# Patient Record
Sex: Female | Born: 1950 | Race: Black or African American | Hispanic: No | Smoking: Former smoker
Health system: Southern US, Community
[De-identification: ages and names within clinical notes are randomized; demographics above are authoritative.]

## PROBLEM LIST (undated history)

## (undated) DIAGNOSIS — G43909 Migraine, unspecified, not intractable, without status migrainosus: Secondary | ICD-10-CM

## (undated) DIAGNOSIS — M1990 Unspecified osteoarthritis, unspecified site: Secondary | ICD-10-CM

## (undated) DIAGNOSIS — T782XXA Anaphylactic shock, unspecified, initial encounter: Secondary | ICD-10-CM

## (undated) DIAGNOSIS — I499 Cardiac arrhythmia, unspecified: Secondary | ICD-10-CM

## (undated) DIAGNOSIS — K2950 Unspecified chronic gastritis without bleeding: Secondary | ICD-10-CM

## (undated) DIAGNOSIS — T8859XA Other complications of anesthesia, initial encounter: Secondary | ICD-10-CM

## (undated) DIAGNOSIS — Z8619 Personal history of other infectious and parasitic diseases: Secondary | ICD-10-CM

## (undated) DIAGNOSIS — R7689 Other specified abnormal immunological findings in serum: Secondary | ICD-10-CM

## (undated) DIAGNOSIS — I729 Aneurysm of unspecified site: Secondary | ICD-10-CM

## (undated) DIAGNOSIS — E119 Type 2 diabetes mellitus without complications: Secondary | ICD-10-CM

## (undated) DIAGNOSIS — B029 Zoster without complications: Secondary | ICD-10-CM

## (undated) DIAGNOSIS — B9681 Helicobacter pylori [H. pylori] as the cause of diseases classified elsewhere: Secondary | ICD-10-CM

## (undated) DIAGNOSIS — J45909 Unspecified asthma, uncomplicated: Secondary | ICD-10-CM

## (undated) DIAGNOSIS — I671 Cerebral aneurysm, nonruptured: Secondary | ICD-10-CM

## (undated) DIAGNOSIS — I10 Essential (primary) hypertension: Secondary | ICD-10-CM

## (undated) DIAGNOSIS — G709 Myoneural disorder, unspecified: Secondary | ICD-10-CM

## (undated) DIAGNOSIS — R768 Other specified abnormal immunological findings in serum: Secondary | ICD-10-CM

## (undated) DIAGNOSIS — K635 Polyp of colon: Secondary | ICD-10-CM

## (undated) DIAGNOSIS — T4145XA Adverse effect of unspecified anesthetic, initial encounter: Secondary | ICD-10-CM

## (undated) HISTORY — DX: Unspecified chronic gastritis without bleeding: K29.50

## (undated) HISTORY — PX: BREAST CYST ASPIRATION: SHX578

## (undated) HISTORY — DX: Personal history of other infectious and parasitic diseases: Z86.19

## (undated) HISTORY — DX: Unspecified asthma, uncomplicated: J45.909

## (undated) HISTORY — DX: Zoster without complications: B02.9

## (undated) HISTORY — DX: Essential (primary) hypertension: I10

## (undated) HISTORY — DX: Helicobacter pylori (H. pylori) as the cause of diseases classified elsewhere: B96.81

## (undated) HISTORY — PX: OOPHORECTOMY: SHX86

## (undated) HISTORY — PX: EYE SURGERY: SHX253

## (undated) HISTORY — DX: Anaphylactic shock, unspecified, initial encounter: T78.2XXA

## (undated) HISTORY — DX: Polyp of colon: K63.5

## (undated) HISTORY — DX: Type 2 diabetes mellitus without complications: E11.9

## (undated) HISTORY — DX: Other specified abnormal immunological findings in serum: R76.8

## (undated) HISTORY — PX: FRACTURE SURGERY: SHX138

## (undated) HISTORY — PX: CHOLECYSTECTOMY: SHX55

## (undated) HISTORY — PX: TONSILLECTOMY: SUR1361

## (undated) HISTORY — DX: Other specified abnormal immunological findings in serum: R76.89

## (undated) HISTORY — PX: REPLACEMENT TOTAL KNEE: SUR1224

## (undated) HISTORY — PX: ABDOMINAL HYSTERECTOMY: SHX81

---

## 1973-07-27 HISTORY — PX: APPENDECTOMY: SHX54

## 1978-07-27 DIAGNOSIS — J45909 Unspecified asthma, uncomplicated: Secondary | ICD-10-CM | POA: Insufficient documentation

## 1987-07-28 DIAGNOSIS — Z90710 Acquired absence of both cervix and uterus: Secondary | ICD-10-CM | POA: Insufficient documentation

## 1998-07-27 DIAGNOSIS — E7800 Pure hypercholesterolemia, unspecified: Secondary | ICD-10-CM | POA: Insufficient documentation

## 1999-07-28 HISTORY — PX: OTHER SURGICAL HISTORY: SHX169

## 2003-07-28 DIAGNOSIS — I671 Cerebral aneurysm, nonruptured: Secondary | ICD-10-CM

## 2003-07-28 HISTORY — DX: Cerebral aneurysm, nonruptured: I67.1

## 2003-11-05 DIAGNOSIS — I776 Arteritis, unspecified: Secondary | ICD-10-CM | POA: Insufficient documentation

## 2004-07-27 DIAGNOSIS — Z8639 Personal history of other endocrine, nutritional and metabolic disease: Secondary | ICD-10-CM | POA: Insufficient documentation

## 2004-09-23 ENCOUNTER — Ambulatory Visit: Payer: Self-pay | Admitting: Unknown Physician Specialty

## 2005-07-27 DIAGNOSIS — K635 Polyp of colon: Secondary | ICD-10-CM

## 2005-07-27 HISTORY — DX: Polyp of colon: K63.5

## 2006-05-27 ENCOUNTER — Ambulatory Visit: Payer: Self-pay | Admitting: Family Medicine

## 2006-07-01 ENCOUNTER — Ambulatory Visit: Payer: Self-pay | Admitting: Gastroenterology

## 2007-04-18 ENCOUNTER — Ambulatory Visit: Payer: Self-pay | Admitting: Family Medicine

## 2007-09-29 ENCOUNTER — Ambulatory Visit: Payer: Self-pay | Admitting: Family Medicine

## 2008-11-08 DIAGNOSIS — Z8249 Family history of ischemic heart disease and other diseases of the circulatory system: Secondary | ICD-10-CM | POA: Insufficient documentation

## 2008-11-20 ENCOUNTER — Ambulatory Visit: Payer: Self-pay | Admitting: Family Medicine

## 2009-02-17 DIAGNOSIS — S39012A Strain of muscle, fascia and tendon of lower back, initial encounter: Secondary | ICD-10-CM | POA: Insufficient documentation

## 2009-03-19 DIAGNOSIS — I10 Essential (primary) hypertension: Secondary | ICD-10-CM | POA: Insufficient documentation

## 2009-04-25 ENCOUNTER — Ambulatory Visit: Payer: Self-pay | Admitting: Family Medicine

## 2009-07-27 HISTORY — PX: OTHER SURGICAL HISTORY: SHX169

## 2009-11-06 ENCOUNTER — Ambulatory Visit: Payer: Self-pay | Admitting: Family Medicine

## 2009-12-09 DIAGNOSIS — K449 Diaphragmatic hernia without obstruction or gangrene: Secondary | ICD-10-CM | POA: Insufficient documentation

## 2010-07-17 ENCOUNTER — Ambulatory Visit: Payer: Self-pay | Admitting: Family Medicine

## 2010-07-17 LAB — HM MAMMOGRAPHY: HM Mammogram: NORMAL

## 2010-08-08 ENCOUNTER — Encounter: Payer: Self-pay | Admitting: Internal Medicine

## 2010-08-27 ENCOUNTER — Encounter: Payer: Self-pay | Admitting: Internal Medicine

## 2010-11-29 ENCOUNTER — Emergency Department: Payer: Self-pay | Admitting: Unknown Physician Specialty

## 2011-06-02 ENCOUNTER — Ambulatory Visit: Payer: Self-pay | Admitting: Family Medicine

## 2011-08-04 ENCOUNTER — Ambulatory Visit: Payer: Self-pay | Admitting: Unknown Physician Specialty

## 2011-10-09 DIAGNOSIS — E559 Vitamin D deficiency, unspecified: Secondary | ICD-10-CM | POA: Insufficient documentation

## 2012-01-06 LAB — HM PAP SMEAR: HM Pap smear: NEGATIVE

## 2012-10-27 ENCOUNTER — Ambulatory Visit: Payer: Self-pay | Admitting: Family Medicine

## 2013-12-28 DIAGNOSIS — J309 Allergic rhinitis, unspecified: Secondary | ICD-10-CM | POA: Diagnosis not present

## 2014-01-05 DIAGNOSIS — J309 Allergic rhinitis, unspecified: Secondary | ICD-10-CM | POA: Diagnosis not present

## 2014-01-09 DIAGNOSIS — I10 Essential (primary) hypertension: Secondary | ICD-10-CM | POA: Diagnosis not present

## 2014-01-09 DIAGNOSIS — R112 Nausea with vomiting, unspecified: Secondary | ICD-10-CM | POA: Diagnosis not present

## 2014-01-09 DIAGNOSIS — E559 Vitamin D deficiency, unspecified: Secondary | ICD-10-CM | POA: Diagnosis not present

## 2014-01-09 DIAGNOSIS — R1013 Epigastric pain: Secondary | ICD-10-CM | POA: Diagnosis not present

## 2014-01-10 DIAGNOSIS — R1013 Epigastric pain: Secondary | ICD-10-CM | POA: Diagnosis not present

## 2014-01-16 DIAGNOSIS — J309 Allergic rhinitis, unspecified: Secondary | ICD-10-CM | POA: Diagnosis not present

## 2014-01-17 DIAGNOSIS — H2510 Age-related nuclear cataract, unspecified eye: Secondary | ICD-10-CM | POA: Diagnosis not present

## 2014-02-01 DIAGNOSIS — J309 Allergic rhinitis, unspecified: Secondary | ICD-10-CM | POA: Diagnosis not present

## 2014-02-10 ENCOUNTER — Inpatient Hospital Stay: Payer: Self-pay | Admitting: Internal Medicine

## 2014-02-10 DIAGNOSIS — I10 Essential (primary) hypertension: Secondary | ICD-10-CM | POA: Diagnosis not present

## 2014-02-10 DIAGNOSIS — Z880 Allergy status to penicillin: Secondary | ICD-10-CM | POA: Diagnosis not present

## 2014-02-10 DIAGNOSIS — E119 Type 2 diabetes mellitus without complications: Secondary | ICD-10-CM | POA: Diagnosis not present

## 2014-02-10 DIAGNOSIS — E876 Hypokalemia: Secondary | ICD-10-CM | POA: Diagnosis not present

## 2014-02-10 DIAGNOSIS — J9600 Acute respiratory failure, unspecified whether with hypoxia or hypercapnia: Secondary | ICD-10-CM | POA: Diagnosis not present

## 2014-02-10 DIAGNOSIS — Z5189 Encounter for other specified aftercare: Secondary | ICD-10-CM | POA: Diagnosis not present

## 2014-02-10 DIAGNOSIS — T782XXA Anaphylactic shock, unspecified, initial encounter: Secondary | ICD-10-CM | POA: Diagnosis not present

## 2014-02-10 DIAGNOSIS — T783XXA Angioneurotic edema, initial encounter: Secondary | ICD-10-CM | POA: Diagnosis not present

## 2014-02-10 DIAGNOSIS — R5381 Other malaise: Secondary | ICD-10-CM | POA: Diagnosis not present

## 2014-02-10 DIAGNOSIS — Z882 Allergy status to sulfonamides status: Secondary | ICD-10-CM | POA: Diagnosis not present

## 2014-02-10 DIAGNOSIS — Z7982 Long term (current) use of aspirin: Secondary | ICD-10-CM | POA: Diagnosis not present

## 2014-02-10 DIAGNOSIS — E785 Hyperlipidemia, unspecified: Secondary | ICD-10-CM | POA: Diagnosis present

## 2014-02-10 DIAGNOSIS — Z9911 Dependence on respirator [ventilator] status: Secondary | ICD-10-CM | POA: Diagnosis not present

## 2014-02-10 DIAGNOSIS — M6281 Muscle weakness (generalized): Secondary | ICD-10-CM | POA: Diagnosis not present

## 2014-02-10 DIAGNOSIS — I499 Cardiac arrhythmia, unspecified: Secondary | ICD-10-CM | POA: Diagnosis not present

## 2014-02-10 DIAGNOSIS — J45909 Unspecified asthma, uncomplicated: Secondary | ICD-10-CM | POA: Diagnosis not present

## 2014-02-10 DIAGNOSIS — R0989 Other specified symptoms and signs involving the circulatory and respiratory systems: Secondary | ICD-10-CM | POA: Diagnosis not present

## 2014-02-10 DIAGNOSIS — Z4682 Encounter for fitting and adjustment of non-vascular catheter: Secondary | ICD-10-CM | POA: Diagnosis not present

## 2014-02-10 DIAGNOSIS — J988 Other specified respiratory disorders: Secondary | ICD-10-CM | POA: Diagnosis not present

## 2014-02-10 LAB — BASIC METABOLIC PANEL
Anion Gap: 13 (ref 7–16)
BUN: 21 mg/dL — ABNORMAL HIGH (ref 7–18)
Calcium, Total: 8.8 mg/dL (ref 8.5–10.1)
Chloride: 101 mmol/L (ref 98–107)
Co2: 25 mmol/L (ref 21–32)
Creatinine: 1.26 mg/dL (ref 0.60–1.30)
EGFR (African American): 52 — ABNORMAL LOW
EGFR (Non-African Amer.): 45 — ABNORMAL LOW
Glucose: 233 mg/dL — ABNORMAL HIGH (ref 65–99)
Osmolality: 288 (ref 275–301)
Potassium: 2.6 mmol/L — ABNORMAL LOW (ref 3.5–5.1)
Sodium: 139 mmol/L (ref 136–145)

## 2014-02-10 LAB — CBC
HCT: 35.9 % (ref 35.0–47.0)
HGB: 11 g/dL — ABNORMAL LOW (ref 12.0–16.0)
MCH: 25.8 pg — ABNORMAL LOW (ref 26.0–34.0)
MCHC: 30.7 g/dL — ABNORMAL LOW (ref 32.0–36.0)
MCV: 84 fL (ref 80–100)
Platelet: 341 10*3/uL (ref 150–440)
RBC: 4.27 10*6/uL (ref 3.80–5.20)
RDW: 15.3 % — ABNORMAL HIGH (ref 11.5–14.5)
WBC: 22.1 10*3/uL — ABNORMAL HIGH (ref 3.6–11.0)

## 2014-02-10 LAB — MAGNESIUM: Magnesium: 1.4 mg/dL — ABNORMAL LOW

## 2014-02-10 LAB — HEMOGLOBIN A1C: Hemoglobin A1C: 7.8 % — ABNORMAL HIGH (ref 4.2–6.3)

## 2014-02-11 LAB — CBC WITH DIFFERENTIAL/PLATELET
Basophil #: 0 10*3/uL (ref 0.0–0.1)
Basophil %: 0.3 %
Eosinophil #: 0 10*3/uL (ref 0.0–0.7)
Eosinophil %: 0.1 %
HCT: 33.6 % — ABNORMAL LOW (ref 35.0–47.0)
HGB: 10.5 g/dL — ABNORMAL LOW (ref 12.0–16.0)
Lymphocyte #: 1.2 10*3/uL (ref 1.0–3.6)
Lymphocyte %: 9.9 %
MCH: 25.7 pg — ABNORMAL LOW (ref 26.0–34.0)
MCHC: 31.2 g/dL — ABNORMAL LOW (ref 32.0–36.0)
MCV: 82 fL (ref 80–100)
Monocyte #: 0.3 x10 3/mm (ref 0.2–0.9)
Monocyte %: 2.8 %
Neutrophil #: 10.5 10*3/uL — ABNORMAL HIGH (ref 1.4–6.5)
Neutrophil %: 86.9 %
Platelet: 260 10*3/uL (ref 150–440)
RBC: 4.07 10*6/uL (ref 3.80–5.20)
RDW: 15.3 % — ABNORMAL HIGH (ref 11.5–14.5)
WBC: 12 10*3/uL — ABNORMAL HIGH (ref 3.6–11.0)

## 2014-02-11 LAB — BASIC METABOLIC PANEL
Anion Gap: 9 (ref 7–16)
BUN: 13 mg/dL (ref 7–18)
Calcium, Total: 8.4 mg/dL — ABNORMAL LOW (ref 8.5–10.1)
Chloride: 109 mmol/L — ABNORMAL HIGH (ref 98–107)
Co2: 23 mmol/L (ref 21–32)
Creatinine: 0.98 mg/dL (ref 0.60–1.30)
EGFR (African American): >60
EGFR (Non-African Amer.): >60
Glucose: 168 mg/dL — ABNORMAL HIGH (ref 65–99)
Osmolality: 285 (ref 275–301)
Potassium: 3.9 mmol/L (ref 3.5–5.1)
Sodium: 141 mmol/L (ref 136–145)

## 2014-02-11 LAB — MAGNESIUM: Magnesium: 2.2 mg/dL

## 2014-02-12 LAB — BASIC METABOLIC PANEL
Anion Gap: 7 (ref 7–16)
BUN: 12 mg/dL (ref 7–18)
Calcium, Total: 8 mg/dL — ABNORMAL LOW (ref 8.5–10.1)
Chloride: 112 mmol/L — ABNORMAL HIGH (ref 98–107)
Co2: 23 mmol/L (ref 21–32)
Creatinine: 1.04 mg/dL (ref 0.60–1.30)
EGFR (African American): >60
EGFR (Non-African Amer.): 56 — ABNORMAL LOW
Glucose: 190 mg/dL — ABNORMAL HIGH (ref 65–99)
Osmolality: 288 (ref 275–301)
Potassium: 4.4 mmol/L (ref 3.5–5.1)
Sodium: 142 mmol/L (ref 136–145)

## 2014-02-14 DIAGNOSIS — M6281 Muscle weakness (generalized): Secondary | ICD-10-CM | POA: Diagnosis not present

## 2014-02-14 DIAGNOSIS — Z5189 Encounter for other specified aftercare: Secondary | ICD-10-CM | POA: Diagnosis not present

## 2014-02-14 DIAGNOSIS — T782XXA Anaphylactic shock, unspecified, initial encounter: Secondary | ICD-10-CM | POA: Diagnosis not present

## 2014-02-14 DIAGNOSIS — I10 Essential (primary) hypertension: Secondary | ICD-10-CM | POA: Diagnosis not present

## 2014-02-14 DIAGNOSIS — R5381 Other malaise: Secondary | ICD-10-CM | POA: Diagnosis not present

## 2014-02-14 DIAGNOSIS — J45909 Unspecified asthma, uncomplicated: Secondary | ICD-10-CM | POA: Diagnosis not present

## 2014-02-14 DIAGNOSIS — E785 Hyperlipidemia, unspecified: Secondary | ICD-10-CM | POA: Diagnosis not present

## 2014-02-14 DIAGNOSIS — I499 Cardiac arrhythmia, unspecified: Secondary | ICD-10-CM | POA: Diagnosis not present

## 2014-02-14 DIAGNOSIS — J45901 Unspecified asthma with (acute) exacerbation: Secondary | ICD-10-CM | POA: Diagnosis not present

## 2014-02-14 DIAGNOSIS — J9600 Acute respiratory failure, unspecified whether with hypoxia or hypercapnia: Secondary | ICD-10-CM | POA: Diagnosis not present

## 2014-02-14 DIAGNOSIS — T783XXA Angioneurotic edema, initial encounter: Secondary | ICD-10-CM | POA: Diagnosis not present

## 2014-02-14 DIAGNOSIS — E119 Type 2 diabetes mellitus without complications: Secondary | ICD-10-CM | POA: Diagnosis not present

## 2014-02-15 ENCOUNTER — Encounter: Payer: Self-pay | Admitting: Internal Medicine

## 2014-02-15 DIAGNOSIS — J45901 Unspecified asthma with (acute) exacerbation: Secondary | ICD-10-CM | POA: Diagnosis not present

## 2014-02-15 DIAGNOSIS — E119 Type 2 diabetes mellitus without complications: Secondary | ICD-10-CM | POA: Diagnosis not present

## 2014-02-22 DIAGNOSIS — E785 Hyperlipidemia, unspecified: Secondary | ICD-10-CM | POA: Diagnosis not present

## 2014-02-22 DIAGNOSIS — IMO0002 Reserved for concepts with insufficient information to code with codable children: Secondary | ICD-10-CM | POA: Diagnosis not present

## 2014-02-22 DIAGNOSIS — J3089 Other allergic rhinitis: Secondary | ICD-10-CM | POA: Diagnosis not present

## 2014-02-22 DIAGNOSIS — I10 Essential (primary) hypertension: Secondary | ICD-10-CM | POA: Diagnosis not present

## 2014-02-22 DIAGNOSIS — J301 Allergic rhinitis due to pollen: Secondary | ICD-10-CM | POA: Diagnosis not present

## 2014-02-22 DIAGNOSIS — M6281 Muscle weakness (generalized): Secondary | ICD-10-CM | POA: Diagnosis not present

## 2014-02-22 DIAGNOSIS — J45909 Unspecified asthma, uncomplicated: Secondary | ICD-10-CM | POA: Diagnosis not present

## 2014-02-22 DIAGNOSIS — L2089 Other atopic dermatitis: Secondary | ICD-10-CM | POA: Diagnosis not present

## 2014-02-22 DIAGNOSIS — Z9181 History of falling: Secondary | ICD-10-CM | POA: Diagnosis not present

## 2014-02-22 DIAGNOSIS — E119 Type 2 diabetes mellitus without complications: Secondary | ICD-10-CM | POA: Diagnosis not present

## 2014-02-24 ENCOUNTER — Encounter: Payer: Self-pay | Admitting: Internal Medicine

## 2014-02-25 DIAGNOSIS — E119 Type 2 diabetes mellitus without complications: Secondary | ICD-10-CM | POA: Diagnosis not present

## 2014-02-25 DIAGNOSIS — E785 Hyperlipidemia, unspecified: Secondary | ICD-10-CM | POA: Diagnosis not present

## 2014-02-25 DIAGNOSIS — Z9181 History of falling: Secondary | ICD-10-CM | POA: Diagnosis not present

## 2014-02-25 DIAGNOSIS — IMO0002 Reserved for concepts with insufficient information to code with codable children: Secondary | ICD-10-CM | POA: Diagnosis not present

## 2014-02-25 DIAGNOSIS — I10 Essential (primary) hypertension: Secondary | ICD-10-CM | POA: Diagnosis not present

## 2014-02-25 DIAGNOSIS — M6281 Muscle weakness (generalized): Secondary | ICD-10-CM | POA: Diagnosis not present

## 2014-02-26 DIAGNOSIS — I10 Essential (primary) hypertension: Secondary | ICD-10-CM | POA: Diagnosis not present

## 2014-02-26 DIAGNOSIS — E559 Vitamin D deficiency, unspecified: Secondary | ICD-10-CM | POA: Diagnosis not present

## 2014-02-26 DIAGNOSIS — R112 Nausea with vomiting, unspecified: Secondary | ICD-10-CM | POA: Diagnosis not present

## 2014-02-26 DIAGNOSIS — T782XXA Anaphylactic shock, unspecified, initial encounter: Secondary | ICD-10-CM | POA: Diagnosis not present

## 2014-03-01 DIAGNOSIS — E785 Hyperlipidemia, unspecified: Secondary | ICD-10-CM | POA: Diagnosis not present

## 2014-03-01 DIAGNOSIS — IMO0002 Reserved for concepts with insufficient information to code with codable children: Secondary | ICD-10-CM | POA: Diagnosis not present

## 2014-03-01 DIAGNOSIS — I10 Essential (primary) hypertension: Secondary | ICD-10-CM | POA: Diagnosis not present

## 2014-03-01 DIAGNOSIS — E119 Type 2 diabetes mellitus without complications: Secondary | ICD-10-CM | POA: Diagnosis not present

## 2014-03-01 DIAGNOSIS — Z9181 History of falling: Secondary | ICD-10-CM | POA: Diagnosis not present

## 2014-03-01 DIAGNOSIS — M6281 Muscle weakness (generalized): Secondary | ICD-10-CM | POA: Diagnosis not present

## 2014-03-02 DIAGNOSIS — E119 Type 2 diabetes mellitus without complications: Secondary | ICD-10-CM | POA: Diagnosis not present

## 2014-03-02 DIAGNOSIS — M6281 Muscle weakness (generalized): Secondary | ICD-10-CM | POA: Diagnosis not present

## 2014-03-02 DIAGNOSIS — I10 Essential (primary) hypertension: Secondary | ICD-10-CM | POA: Diagnosis not present

## 2014-03-02 DIAGNOSIS — Z9181 History of falling: Secondary | ICD-10-CM | POA: Diagnosis not present

## 2014-03-02 DIAGNOSIS — IMO0002 Reserved for concepts with insufficient information to code with codable children: Secondary | ICD-10-CM | POA: Diagnosis not present

## 2014-03-02 DIAGNOSIS — E785 Hyperlipidemia, unspecified: Secondary | ICD-10-CM | POA: Diagnosis not present

## 2014-03-03 DIAGNOSIS — M6281 Muscle weakness (generalized): Secondary | ICD-10-CM | POA: Diagnosis not present

## 2014-03-03 DIAGNOSIS — E785 Hyperlipidemia, unspecified: Secondary | ICD-10-CM | POA: Diagnosis not present

## 2014-03-03 DIAGNOSIS — Z9181 History of falling: Secondary | ICD-10-CM | POA: Diagnosis not present

## 2014-03-03 DIAGNOSIS — E119 Type 2 diabetes mellitus without complications: Secondary | ICD-10-CM | POA: Diagnosis not present

## 2014-03-03 DIAGNOSIS — I10 Essential (primary) hypertension: Secondary | ICD-10-CM | POA: Diagnosis not present

## 2014-03-03 DIAGNOSIS — IMO0002 Reserved for concepts with insufficient information to code with codable children: Secondary | ICD-10-CM | POA: Diagnosis not present

## 2014-03-04 DIAGNOSIS — M6281 Muscle weakness (generalized): Secondary | ICD-10-CM | POA: Diagnosis not present

## 2014-03-04 DIAGNOSIS — E119 Type 2 diabetes mellitus without complications: Secondary | ICD-10-CM | POA: Diagnosis not present

## 2014-03-04 DIAGNOSIS — I10 Essential (primary) hypertension: Secondary | ICD-10-CM | POA: Diagnosis not present

## 2014-03-04 DIAGNOSIS — Z9181 History of falling: Secondary | ICD-10-CM | POA: Diagnosis not present

## 2014-03-04 DIAGNOSIS — IMO0002 Reserved for concepts with insufficient information to code with codable children: Secondary | ICD-10-CM | POA: Diagnosis not present

## 2014-03-04 DIAGNOSIS — E785 Hyperlipidemia, unspecified: Secondary | ICD-10-CM | POA: Diagnosis not present

## 2014-03-05 DIAGNOSIS — M6281 Muscle weakness (generalized): Secondary | ICD-10-CM | POA: Diagnosis not present

## 2014-03-05 DIAGNOSIS — I10 Essential (primary) hypertension: Secondary | ICD-10-CM | POA: Diagnosis not present

## 2014-03-05 DIAGNOSIS — R51 Headache: Secondary | ICD-10-CM | POA: Diagnosis not present

## 2014-03-05 DIAGNOSIS — R112 Nausea with vomiting, unspecified: Secondary | ICD-10-CM | POA: Diagnosis not present

## 2014-03-05 DIAGNOSIS — E785 Hyperlipidemia, unspecified: Secondary | ICD-10-CM | POA: Diagnosis not present

## 2014-03-05 DIAGNOSIS — E119 Type 2 diabetes mellitus without complications: Secondary | ICD-10-CM | POA: Diagnosis not present

## 2014-03-05 DIAGNOSIS — E559 Vitamin D deficiency, unspecified: Secondary | ICD-10-CM | POA: Diagnosis not present

## 2014-03-07 DIAGNOSIS — I10 Essential (primary) hypertension: Secondary | ICD-10-CM | POA: Diagnosis not present

## 2014-03-07 DIAGNOSIS — M6281 Muscle weakness (generalized): Secondary | ICD-10-CM | POA: Diagnosis not present

## 2014-03-07 DIAGNOSIS — IMO0002 Reserved for concepts with insufficient information to code with codable children: Secondary | ICD-10-CM | POA: Diagnosis not present

## 2014-03-07 DIAGNOSIS — E785 Hyperlipidemia, unspecified: Secondary | ICD-10-CM | POA: Diagnosis not present

## 2014-03-07 DIAGNOSIS — Z9181 History of falling: Secondary | ICD-10-CM | POA: Diagnosis not present

## 2014-03-07 DIAGNOSIS — E119 Type 2 diabetes mellitus without complications: Secondary | ICD-10-CM | POA: Diagnosis not present

## 2014-03-13 DIAGNOSIS — E785 Hyperlipidemia, unspecified: Secondary | ICD-10-CM | POA: Diagnosis not present

## 2014-03-13 DIAGNOSIS — I10 Essential (primary) hypertension: Secondary | ICD-10-CM | POA: Diagnosis not present

## 2014-03-13 DIAGNOSIS — I38 Endocarditis, valve unspecified: Secondary | ICD-10-CM | POA: Diagnosis not present

## 2014-03-13 DIAGNOSIS — I671 Cerebral aneurysm, nonruptured: Secondary | ICD-10-CM | POA: Insufficient documentation

## 2014-03-14 DIAGNOSIS — I10 Essential (primary) hypertension: Secondary | ICD-10-CM | POA: Diagnosis not present

## 2014-03-14 DIAGNOSIS — I110 Hypertensive heart disease with heart failure: Secondary | ICD-10-CM | POA: Diagnosis not present

## 2014-03-14 DIAGNOSIS — A048 Other specified bacterial intestinal infections: Secondary | ICD-10-CM | POA: Diagnosis not present

## 2014-03-14 DIAGNOSIS — K2940 Chronic atrophic gastritis without bleeding: Secondary | ICD-10-CM | POA: Diagnosis not present

## 2014-03-14 DIAGNOSIS — E785 Hyperlipidemia, unspecified: Secondary | ICD-10-CM | POA: Diagnosis not present

## 2014-03-15 DIAGNOSIS — I10 Essential (primary) hypertension: Secondary | ICD-10-CM | POA: Diagnosis not present

## 2014-03-15 DIAGNOSIS — E785 Hyperlipidemia, unspecified: Secondary | ICD-10-CM | POA: Diagnosis not present

## 2014-03-15 DIAGNOSIS — M6281 Muscle weakness (generalized): Secondary | ICD-10-CM | POA: Diagnosis not present

## 2014-03-15 DIAGNOSIS — IMO0002 Reserved for concepts with insufficient information to code with codable children: Secondary | ICD-10-CM | POA: Diagnosis not present

## 2014-03-15 DIAGNOSIS — Z9181 History of falling: Secondary | ICD-10-CM | POA: Diagnosis not present

## 2014-03-15 DIAGNOSIS — E119 Type 2 diabetes mellitus without complications: Secondary | ICD-10-CM | POA: Diagnosis not present

## 2014-03-21 DIAGNOSIS — Z9181 History of falling: Secondary | ICD-10-CM | POA: Diagnosis not present

## 2014-03-21 DIAGNOSIS — M6281 Muscle weakness (generalized): Secondary | ICD-10-CM | POA: Diagnosis not present

## 2014-03-21 DIAGNOSIS — R609 Edema, unspecified: Secondary | ICD-10-CM | POA: Diagnosis not present

## 2014-03-21 DIAGNOSIS — E119 Type 2 diabetes mellitus without complications: Secondary | ICD-10-CM | POA: Diagnosis not present

## 2014-03-21 DIAGNOSIS — IMO0002 Reserved for concepts with insufficient information to code with codable children: Secondary | ICD-10-CM | POA: Diagnosis not present

## 2014-03-21 DIAGNOSIS — E785 Hyperlipidemia, unspecified: Secondary | ICD-10-CM | POA: Diagnosis not present

## 2014-03-21 DIAGNOSIS — A048 Other specified bacterial intestinal infections: Secondary | ICD-10-CM | POA: Diagnosis not present

## 2014-03-21 DIAGNOSIS — I10 Essential (primary) hypertension: Secondary | ICD-10-CM | POA: Diagnosis not present

## 2014-03-29 DIAGNOSIS — I10 Essential (primary) hypertension: Secondary | ICD-10-CM | POA: Diagnosis not present

## 2014-04-04 DIAGNOSIS — I10 Essential (primary) hypertension: Secondary | ICD-10-CM | POA: Diagnosis not present

## 2014-04-04 DIAGNOSIS — E785 Hyperlipidemia, unspecified: Secondary | ICD-10-CM | POA: Diagnosis not present

## 2014-04-04 DIAGNOSIS — I38 Endocarditis, valve unspecified: Secondary | ICD-10-CM | POA: Diagnosis not present

## 2014-04-04 DIAGNOSIS — R609 Edema, unspecified: Secondary | ICD-10-CM | POA: Diagnosis not present

## 2014-04-05 DIAGNOSIS — J309 Allergic rhinitis, unspecified: Secondary | ICD-10-CM | POA: Diagnosis not present

## 2014-04-24 DIAGNOSIS — G500 Trigeminal neuralgia: Secondary | ICD-10-CM | POA: Diagnosis not present

## 2014-04-24 DIAGNOSIS — J309 Allergic rhinitis, unspecified: Secondary | ICD-10-CM | POA: Diagnosis not present

## 2014-05-07 DIAGNOSIS — I10 Essential (primary) hypertension: Secondary | ICD-10-CM | POA: Diagnosis not present

## 2014-05-07 DIAGNOSIS — Z23 Encounter for immunization: Secondary | ICD-10-CM | POA: Diagnosis not present

## 2014-05-07 DIAGNOSIS — E119 Type 2 diabetes mellitus without complications: Secondary | ICD-10-CM | POA: Diagnosis not present

## 2014-05-07 DIAGNOSIS — J069 Acute upper respiratory infection, unspecified: Secondary | ICD-10-CM | POA: Diagnosis not present

## 2014-05-08 DIAGNOSIS — R51 Headache: Secondary | ICD-10-CM | POA: Diagnosis not present

## 2014-05-08 DIAGNOSIS — G500 Trigeminal neuralgia: Secondary | ICD-10-CM | POA: Diagnosis not present

## 2014-05-08 DIAGNOSIS — R9082 White matter disease, unspecified: Secondary | ICD-10-CM | POA: Diagnosis not present

## 2014-05-10 DIAGNOSIS — J3089 Other allergic rhinitis: Secondary | ICD-10-CM | POA: Diagnosis not present

## 2014-05-10 DIAGNOSIS — J301 Allergic rhinitis due to pollen: Secondary | ICD-10-CM | POA: Diagnosis not present

## 2014-05-15 DIAGNOSIS — J3089 Other allergic rhinitis: Secondary | ICD-10-CM | POA: Diagnosis not present

## 2014-05-31 DIAGNOSIS — J301 Allergic rhinitis due to pollen: Secondary | ICD-10-CM | POA: Diagnosis not present

## 2014-05-31 DIAGNOSIS — J3089 Other allergic rhinitis: Secondary | ICD-10-CM | POA: Diagnosis not present

## 2014-06-07 DIAGNOSIS — J3089 Other allergic rhinitis: Secondary | ICD-10-CM | POA: Diagnosis not present

## 2014-06-07 DIAGNOSIS — J301 Allergic rhinitis due to pollen: Secondary | ICD-10-CM | POA: Diagnosis not present

## 2014-06-14 DIAGNOSIS — J301 Allergic rhinitis due to pollen: Secondary | ICD-10-CM | POA: Diagnosis not present

## 2014-06-14 DIAGNOSIS — J3089 Other allergic rhinitis: Secondary | ICD-10-CM | POA: Diagnosis not present

## 2014-06-19 DIAGNOSIS — J301 Allergic rhinitis due to pollen: Secondary | ICD-10-CM | POA: Diagnosis not present

## 2014-06-19 DIAGNOSIS — J3089 Other allergic rhinitis: Secondary | ICD-10-CM | POA: Diagnosis not present

## 2014-07-04 DIAGNOSIS — G43719 Chronic migraine without aura, intractable, without status migrainosus: Secondary | ICD-10-CM | POA: Diagnosis not present

## 2014-07-04 DIAGNOSIS — G500 Trigeminal neuralgia: Secondary | ICD-10-CM | POA: Diagnosis not present

## 2014-07-05 DIAGNOSIS — Z79890 Hormone replacement therapy (postmenopausal): Secondary | ICD-10-CM | POA: Diagnosis not present

## 2014-07-05 DIAGNOSIS — G43019 Migraine without aura, intractable, without status migrainosus: Secondary | ICD-10-CM | POA: Insufficient documentation

## 2014-07-05 DIAGNOSIS — Z01419 Encounter for gynecological examination (general) (routine) without abnormal findings: Secondary | ICD-10-CM | POA: Diagnosis not present

## 2014-07-05 DIAGNOSIS — R61 Generalized hyperhidrosis: Secondary | ICD-10-CM | POA: Diagnosis not present

## 2014-07-05 DIAGNOSIS — N951 Menopausal and female climacteric states: Secondary | ICD-10-CM | POA: Diagnosis not present

## 2014-07-05 DIAGNOSIS — Z1231 Encounter for screening mammogram for malignant neoplasm of breast: Secondary | ICD-10-CM | POA: Diagnosis not present

## 2014-07-13 DIAGNOSIS — J301 Allergic rhinitis due to pollen: Secondary | ICD-10-CM | POA: Diagnosis not present

## 2014-07-13 DIAGNOSIS — J3089 Other allergic rhinitis: Secondary | ICD-10-CM | POA: Diagnosis not present

## 2014-07-31 DIAGNOSIS — J3089 Other allergic rhinitis: Secondary | ICD-10-CM | POA: Diagnosis not present

## 2014-07-31 DIAGNOSIS — J301 Allergic rhinitis due to pollen: Secondary | ICD-10-CM | POA: Diagnosis not present

## 2014-08-16 DIAGNOSIS — J301 Allergic rhinitis due to pollen: Secondary | ICD-10-CM | POA: Diagnosis not present

## 2014-08-16 DIAGNOSIS — J3089 Other allergic rhinitis: Secondary | ICD-10-CM | POA: Diagnosis not present

## 2014-08-23 DIAGNOSIS — J301 Allergic rhinitis due to pollen: Secondary | ICD-10-CM | POA: Diagnosis not present

## 2014-08-23 DIAGNOSIS — J3089 Other allergic rhinitis: Secondary | ICD-10-CM | POA: Diagnosis not present

## 2014-08-23 DIAGNOSIS — E780 Pure hypercholesterolemia: Secondary | ICD-10-CM | POA: Diagnosis not present

## 2014-08-23 DIAGNOSIS — I10 Essential (primary) hypertension: Secondary | ICD-10-CM | POA: Diagnosis not present

## 2014-08-23 DIAGNOSIS — E119 Type 2 diabetes mellitus without complications: Secondary | ICD-10-CM | POA: Diagnosis not present

## 2014-08-23 DIAGNOSIS — E11620 Type 2 diabetes mellitus with diabetic dermatitis: Secondary | ICD-10-CM | POA: Diagnosis not present

## 2014-08-23 DIAGNOSIS — Z23 Encounter for immunization: Secondary | ICD-10-CM | POA: Diagnosis not present

## 2014-08-23 LAB — LIPID PANEL
Cholesterol: 176 mg/dL (ref 0–200)
HDL: 45 mg/dL (ref 35–70)
LDL Cholesterol: 77 mg/dL
Triglycerides: 269 mg/dL — AB (ref 40–160)

## 2014-08-28 DIAGNOSIS — J301 Allergic rhinitis due to pollen: Secondary | ICD-10-CM | POA: Diagnosis not present

## 2014-08-28 DIAGNOSIS — J3089 Other allergic rhinitis: Secondary | ICD-10-CM | POA: Diagnosis not present

## 2014-09-06 DIAGNOSIS — M793 Panniculitis, unspecified: Secondary | ICD-10-CM | POA: Diagnosis not present

## 2014-09-06 DIAGNOSIS — I8312 Varicose veins of left lower extremity with inflammation: Secondary | ICD-10-CM | POA: Diagnosis not present

## 2014-09-06 DIAGNOSIS — I8311 Varicose veins of right lower extremity with inflammation: Secondary | ICD-10-CM | POA: Diagnosis not present

## 2014-09-07 DIAGNOSIS — I8312 Varicose veins of left lower extremity with inflammation: Secondary | ICD-10-CM | POA: Diagnosis not present

## 2014-09-07 DIAGNOSIS — I8311 Varicose veins of right lower extremity with inflammation: Secondary | ICD-10-CM | POA: Diagnosis not present

## 2014-09-07 DIAGNOSIS — M793 Panniculitis, unspecified: Secondary | ICD-10-CM | POA: Diagnosis not present

## 2014-09-13 DIAGNOSIS — I8311 Varicose veins of right lower extremity with inflammation: Secondary | ICD-10-CM | POA: Diagnosis not present

## 2014-09-13 DIAGNOSIS — M793 Panniculitis, unspecified: Secondary | ICD-10-CM | POA: Diagnosis not present

## 2014-09-13 DIAGNOSIS — I8312 Varicose veins of left lower extremity with inflammation: Secondary | ICD-10-CM | POA: Diagnosis not present

## 2014-09-14 ENCOUNTER — Ambulatory Visit: Payer: Self-pay | Admitting: Family Medicine

## 2014-09-14 DIAGNOSIS — Z23 Encounter for immunization: Secondary | ICD-10-CM | POA: Diagnosis not present

## 2014-09-14 DIAGNOSIS — M25512 Pain in left shoulder: Secondary | ICD-10-CM | POA: Diagnosis not present

## 2014-09-14 DIAGNOSIS — I10 Essential (primary) hypertension: Secondary | ICD-10-CM | POA: Diagnosis not present

## 2014-09-14 DIAGNOSIS — M25511 Pain in right shoulder: Secondary | ICD-10-CM | POA: Diagnosis not present

## 2014-09-14 DIAGNOSIS — J301 Allergic rhinitis due to pollen: Secondary | ICD-10-CM | POA: Diagnosis not present

## 2014-09-14 DIAGNOSIS — M19011 Primary osteoarthritis, right shoulder: Secondary | ICD-10-CM | POA: Diagnosis not present

## 2014-09-14 DIAGNOSIS — E119 Type 2 diabetes mellitus without complications: Secondary | ICD-10-CM | POA: Diagnosis not present

## 2014-09-14 DIAGNOSIS — J3089 Other allergic rhinitis: Secondary | ICD-10-CM | POA: Diagnosis not present

## 2014-09-14 DIAGNOSIS — M19012 Primary osteoarthritis, left shoulder: Secondary | ICD-10-CM | POA: Diagnosis not present

## 2014-09-20 DIAGNOSIS — I8311 Varicose veins of right lower extremity with inflammation: Secondary | ICD-10-CM | POA: Diagnosis not present

## 2014-09-20 DIAGNOSIS — M793 Panniculitis, unspecified: Secondary | ICD-10-CM | POA: Diagnosis not present

## 2014-09-20 DIAGNOSIS — J301 Allergic rhinitis due to pollen: Secondary | ICD-10-CM | POA: Diagnosis not present

## 2014-09-20 DIAGNOSIS — J3089 Other allergic rhinitis: Secondary | ICD-10-CM | POA: Diagnosis not present

## 2014-09-20 DIAGNOSIS — I8312 Varicose veins of left lower extremity with inflammation: Secondary | ICD-10-CM | POA: Diagnosis not present

## 2014-09-25 DIAGNOSIS — J301 Allergic rhinitis due to pollen: Secondary | ICD-10-CM | POA: Diagnosis not present

## 2014-09-25 DIAGNOSIS — J3089 Other allergic rhinitis: Secondary | ICD-10-CM | POA: Diagnosis not present

## 2014-10-04 DIAGNOSIS — J3089 Other allergic rhinitis: Secondary | ICD-10-CM | POA: Diagnosis not present

## 2014-10-04 DIAGNOSIS — J301 Allergic rhinitis due to pollen: Secondary | ICD-10-CM | POA: Diagnosis not present

## 2014-10-09 DIAGNOSIS — J4530 Mild persistent asthma, uncomplicated: Secondary | ICD-10-CM | POA: Diagnosis not present

## 2014-10-09 DIAGNOSIS — L209 Atopic dermatitis, unspecified: Secondary | ICD-10-CM | POA: Diagnosis not present

## 2014-10-09 DIAGNOSIS — J3089 Other allergic rhinitis: Secondary | ICD-10-CM | POA: Diagnosis not present

## 2014-10-09 DIAGNOSIS — J301 Allergic rhinitis due to pollen: Secondary | ICD-10-CM | POA: Diagnosis not present

## 2014-10-18 DIAGNOSIS — J3089 Other allergic rhinitis: Secondary | ICD-10-CM | POA: Diagnosis not present

## 2014-10-18 DIAGNOSIS — J301 Allergic rhinitis due to pollen: Secondary | ICD-10-CM | POA: Diagnosis not present

## 2014-10-23 DIAGNOSIS — E119 Type 2 diabetes mellitus without complications: Secondary | ICD-10-CM | POA: Diagnosis not present

## 2014-10-23 DIAGNOSIS — I770 Arteriovenous fistula, acquired: Secondary | ICD-10-CM | POA: Diagnosis not present

## 2014-10-23 DIAGNOSIS — J301 Allergic rhinitis due to pollen: Secondary | ICD-10-CM | POA: Diagnosis not present

## 2014-10-23 DIAGNOSIS — J3089 Other allergic rhinitis: Secondary | ICD-10-CM | POA: Diagnosis not present

## 2014-10-23 DIAGNOSIS — I10 Essential (primary) hypertension: Secondary | ICD-10-CM | POA: Diagnosis not present

## 2014-10-23 DIAGNOSIS — E782 Mixed hyperlipidemia: Secondary | ICD-10-CM | POA: Diagnosis not present

## 2014-10-30 DIAGNOSIS — J3089 Other allergic rhinitis: Secondary | ICD-10-CM | POA: Diagnosis not present

## 2014-10-30 DIAGNOSIS — J301 Allergic rhinitis due to pollen: Secondary | ICD-10-CM | POA: Diagnosis not present

## 2014-11-08 DIAGNOSIS — J3089 Other allergic rhinitis: Secondary | ICD-10-CM | POA: Diagnosis not present

## 2014-11-08 DIAGNOSIS — J301 Allergic rhinitis due to pollen: Secondary | ICD-10-CM | POA: Diagnosis not present

## 2014-11-15 DIAGNOSIS — J3089 Other allergic rhinitis: Secondary | ICD-10-CM | POA: Diagnosis not present

## 2014-11-15 DIAGNOSIS — J301 Allergic rhinitis due to pollen: Secondary | ICD-10-CM | POA: Diagnosis not present

## 2014-11-17 NOTE — H&P (Signed)
PATIENT NAME:  Sharon Nelson, Sharon Nelson MR#:  [id number] DATE OF BIRTH:  09-26-1950  DATE OF ADMISSION:  02/10/2014  PRIMARY CARE PHYSICIAN: Kirstie Peri. Caryn Section, MD  REFERRING PHYSICIAN: Yetta Numbers. Karma Greaser, MD  CHIEF COMPLAINT: Shortness of breath and swollen lips.   HISTORY OF PRESENT ILLNESS: The patient is a 64 year old African-American female with past medical history of hypertension, hyperlipidemia and diabetes mellitus, status post intubation x2 for allergic reactions in the past, who is presenting to the ED with a chief complaint of hives, shortness of breath, swollen lips and difficulty with swallowing. As reported by the ER staff, the patient had some [place] food the day before yesterday and started developing some rash and itching yesterday. The patient took Benadryl 2 or 3 times, with no significant improvement. This morning, she woke up with shortness of breath, and also she noticed swelling of her lower lip. Also, the patient was having difficulty with swallowing. She came into the ED, and by the time she came into the ED, her speech was garbled, and her lips and throat were swollen with hives. The patient was short of breath as well. The patient was immediately intubated prophylactically. She was given EpiPen 2 times, Benadryl IV, Solu-Medrol and Pepcid IV. Subsequently, the patient was started on propofol for sedation. The patient's brother, who brought her in, went home to check on the patient's husband, who has Alzheimer's dementia. I was unable to get any history from the patient as she is intubated, and no family members are available at bedside.   PAST MEDICAL HISTORY:  1. Hypertension.  2. Hyperlipidemia.  3. Diabetes mellitus.  4. Past history of intubation x2 for anaphylactic reaction   PAST SURGICAL HISTORY:  1. Tonsillectomy.  2. Appendectomy.  3. Right knee surgery.  4. Cholecystectomy.  5. Total hysterectomy.  6. 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   ALLERGIES: THE PATIENT IS ALLERGIC  TO PENICILLIN AND SULFA, AND WILL ADD ACE INHIBITOR TO HER ALLERGY LIST.   PSYCHOSOCIAL HISTORY: Lives at home with husband, who has Alzheimer's dementia. Other social history is not available.   FAMILY HISTORY: Diabetes runs in her family.   HOME MEDICATIONS:  1. Promethazine 25 mg 1 tablet p.o. 4 times a day. 2. Famotidine 20 mg 2 times a day. 3. Tylenol as needed.   REVIEW OF SYSTEMS: Unobtainable.   PHYSICAL EXAMINATION:  VITAL SIGNS: Temperature 98.6, pulse 94, respirations 18, blood pressure 124/61, pulse oximetry is 100% on mechanical ventilator.  GENERAL APPEARANCE: Not under acute distress, moderately built and nourished.  HEENT: Normocephalic, with angioedema. Both upper and lower lips were swollen. The ET tube is intact.  NECK: Supple. No JVD.  LUNGS: Moderate air entry. No accessory muscle usage.  CARDIAC: S1, S2 normal. Regular rate and rhythm. No murmurs.  GASTROINTESTINAL: Soft. Bowel sounds are positive in all 4 quadrants. Nontender, nondistended. No masses felt.  NEUROLOGIC: The patient is sedated with propofol as she is intubated. Reflexes are 2+. Motor and sensory could not be elicited.  PSYCHIATRIC: The patient is sedated. Mood and affect could not be elicited.  MUSCULOSKELETAL: No joint effusion or tenderness.  LABORATORY AND IMAGING STUDIES: WBC 22.1, hemoglobin 11.2, hematocrit 35.9, platelets are 341, MCV 84. Glucose 233, BUN 21, creatinine and sodium are normal, potassium is at 2.6, chloride is 101, CO2 25, anion gap 13, GFR is 52, serum osmolality and calcium are normal. Portable chest x-ray: Endotracheal tube tip projects 3.8 cm above the carina. Mild central pulmonary vascular congestion, interstitial prominence,  likely reflecting pulmonary edema. No focal consolidation.   ASSESSMENT AND PLAN: A 64 year old African-American female coming into the ED with itching, hives and angioedema. The patient was intubated prophylactically in the ED as she was complaining of  shortness of breath associated with swollen lips and throat.   1. ANAPHYLACTIC REACTION SECONDARY TO MOST LIKELY FOOD AND ACE INHIBITOR, status post prophylactic intubation. Will admit the patient to CCU. Keep her n.p.o. and provide IV fluids. The patient was given EpiPen, Solu-Medrol IV, Benadryl IV, Pepcid IV in the ED. Will continue Solu-Medrol 60 mg IV q.6 hours. Will continue Benadryl and Pepcid. Will provide EpiPen as-needed basis. Will discontinue her home medication ACE inhibitor. Still, medicines need to be reconciled. Pulmonology consult is placed to on-call pulmonologist, Dr. Raul Del, regarding vent management.  2. Hypokalemia. Will provide 40 of potassium via OT tube and also 40 IV in 4 hours. Check magnesium.  3. Chronic history of hypertension. Will provide her Lopressor IV as-needed basis.  4. Diabetes mellitus. The patient is n.p.o. Provide insulin sliding scale.  5. Hyperlipidemia. Hold p.o. medications.   CODE STATUS: She is full code. Brother is the medical power of attorney, who brought her into the hospital and who is aware of the plan of care.  TOTAL CRITICAL CARE TIME SPENT: 60 minutes.   ____________________________ Nicholes Mango, MD ag:lb D: 02/10/2014 07:21:19 ET T: 02/10/2014 08:30:13 ET JOB#: [id number]  cc: Nicholes Mango, MD, <Dictator> Kirstie Peri. Caryn Section, MD Nicholes Mango MD ELECTRONICALLY SIGNED 02/12/2014 0:45

## 2014-11-17 NOTE — Discharge Summary (Signed)
PATIENT NAME:  Sharon Nelson, Sharon Nelson MR#:  [id number] DATE OF BIRTH:  Apr 17, 1951  DATE OF ADMISSION:  02/10/2014 DATE OF DISCHARGE:  02/14/2014   DISCHARGE DIAGNOSES:  1. ANAPHYLACTIC REACTION OF UNKNOWN ETIOLOGY, POSSIBLY FROM ACE INHIBITOR, COULD ALSO BE DUE TO SOME FOOD THAT SHE ATE AT [employer]. She did require intubation for 48 hours. Remains extubated for more than 48 hours now. 2. Hypokalemia/hypomagnesemia, repleted.  3. Generalized weakness, multifactorial. Will require rehab per physical therapy.   SECONDARY DIAGNOSES:  1. Hypertension.  2. Diabetes.  3. Hyperlipidemia.   CONSULTATIONS: Pulmonary, Dr. Mortimer Fries.   PROCEDURES AND RADIOLOGY:  Chest x-ray on 18th of July showed endotracheal  tube deep, projecting 3.8 cm above the carina. No focal consultation.  Chest x-ray on 19th of July showed endotracheal tube and NG tube in appropriate position. No active lung disease.   MAJOR LABORATORY PANEL: C1 esterase inhibitor was within normal limits.   HISTORY AND SHORT HOSPITAL COURSE: The patient is a 64 year old female with the above-mentioned medical problems, who was admitted for shortness of breath and swollen lips, worrisome for anaphylactic reaction, likely due to ACE inhibitor and/or the food that she ate. She required intubation on admission. Please see Dr. Rinaldo Ratel dictated history and physical for further details. Pulmonary consultation was obtained with Dr. Patricia Pesa. She was able to get extubated within 48 hours, and her allergic reaction has started getting improving, although she was feeling very weak, which required physical therapy consultation, and she was found to have significant deconditioning and likely need for short-term rehab, where she is being discharged in stable condition.   PERTINENT PHYSICAL EXAMINATION ON THE DATE OF DISCHARGE:  VITAL SIGNS: As follows: Temperature 98, heart rate 78 per minute, respirations 20 per minute, blood pressure 115/51 mmHg. She is saturating 97%  on room air.  CARDIOVASCULAR: S1, S2 normal. No murmurs, rubs or gallop.  LUNGS: Clear to auscultation bilaterally. No wheezes, rales, rhonchi or crepitation.  ABDOMEN: Soft, benign.  NEUROLOGICAL: Nonfocal examination. Generalized weakness present.  All other physical examination remained at baseline.   DISCHARGE MEDICATIONS:  1. Lovastatin 40 mg p.o. daily.  2. Levocetirizine 5 mg p.o. daily.  3. Estradiol 1 mg p.o. daily.  4. Metformin 500 mg p.o. b.i.d.  5. Vitamin D3 2000 international units once daily.  6. Aspirin 81 mg p.o. daily.  7. Hydroxyzine 50 mg p.o. 4 times a day as needed.  8. Pepcid 20 mg p.o. b.i.d.  9. Singulair once daily.  10. Advair 250/50 one puff inhaled twice a day.  11. Proventil 2 puffs inhaled 4 times a day.  12. Medrol 10 mg Dosepak for 12 days.  62. EpiPen for severe anaphylaxis.   DISCHARGE DIET: Low sodium, 1800 ADA.   DISCHARGE ACTIVITY: As tolerated.   DISCHARGE INSTRUCTIONS AND FOLLOWUP: THE PATIENT WAS INSTRUCTED TO STOP ANY ACE INHIBITOR CLASS OF DRUG. She was instructed to follow up with her primary care physician, Dr. [name], in 2 to 4 weeks. She will need followup with [facility] specialist, Dr. Mosetta Anis, in 1 to 2 weeks.   TOTAL TIME DISCHARGING THIS PATIENT: 45 minutes.     ____________________________ Lucina Mellow. Manuella Ghazi, MD vss:lb D: 02/14/2014 12:40:12 ET T: 02/14/2014 13:04:46 ET JOB#: [id number]  cc: Koda Routon S. Manuella Ghazi, MD, <Dictator> Kirstie Peri. Caryn Section, MD Mariane Duval, MD Mosetta Anis, MD Lucina Mellow [facility] MD ELECTRONICALLY SIGNED 02/15/2014 20:47

## 2014-11-22 ENCOUNTER — Other Ambulatory Visit: Payer: Self-pay | Admitting: Family Medicine

## 2014-11-22 DIAGNOSIS — E1165 Type 2 diabetes mellitus with hyperglycemia: Secondary | ICD-10-CM | POA: Diagnosis not present

## 2014-11-22 DIAGNOSIS — J3089 Other allergic rhinitis: Secondary | ICD-10-CM | POA: Diagnosis not present

## 2014-11-22 DIAGNOSIS — E559 Vitamin D deficiency, unspecified: Secondary | ICD-10-CM | POA: Diagnosis not present

## 2014-11-22 DIAGNOSIS — J3081 Allergic rhinitis due to animal (cat) (dog) hair and dander: Secondary | ICD-10-CM | POA: Diagnosis not present

## 2014-11-22 DIAGNOSIS — Z13820 Encounter for screening for osteoporosis: Secondary | ICD-10-CM

## 2014-11-22 DIAGNOSIS — I10 Essential (primary) hypertension: Secondary | ICD-10-CM | POA: Diagnosis not present

## 2014-11-22 DIAGNOSIS — R609 Edema, unspecified: Secondary | ICD-10-CM | POA: Diagnosis not present

## 2014-11-22 LAB — HEMOGLOBIN A1C: Hgb A1c MFr Bld: 7.6 % — AB (ref 4.0–6.0)

## 2014-11-22 LAB — BASIC METABOLIC PANEL
BUN: 14 mg/dL (ref 4–21)
Creatinine: 0.9 mg/dL (ref 0.5–1.1)
Glucose: 134 mg/dL
Potassium: 4.7 mmol/L (ref 3.4–5.3)
Sodium: 142 mmol/L (ref 137–147)

## 2014-11-30 DIAGNOSIS — J3089 Other allergic rhinitis: Secondary | ICD-10-CM | POA: Diagnosis not present

## 2014-11-30 DIAGNOSIS — J301 Allergic rhinitis due to pollen: Secondary | ICD-10-CM | POA: Diagnosis not present

## 2014-12-03 ENCOUNTER — Ambulatory Visit
Admission: RE | Admit: 2014-12-03 | Discharge: 2014-12-03 | Disposition: A | Discharge status: Home / Self Care | Payer: Medicare Other | Source: Ambulatory Visit | Attending: Family Medicine | Admitting: Family Medicine

## 2014-12-03 DIAGNOSIS — Z13820 Encounter for screening for osteoporosis: Secondary | ICD-10-CM | POA: Diagnosis present

## 2014-12-03 DIAGNOSIS — M8580 Other specified disorders of bone density and structure, unspecified site: Secondary | ICD-10-CM | POA: Insufficient documentation

## 2014-12-03 DIAGNOSIS — M859 Disorder of bone density and structure, unspecified: Secondary | ICD-10-CM | POA: Diagnosis not present

## 2014-12-06 DIAGNOSIS — J301 Allergic rhinitis due to pollen: Secondary | ICD-10-CM | POA: Diagnosis not present

## 2014-12-06 DIAGNOSIS — J3089 Other allergic rhinitis: Secondary | ICD-10-CM | POA: Diagnosis not present

## 2014-12-13 DIAGNOSIS — J301 Allergic rhinitis due to pollen: Secondary | ICD-10-CM | POA: Diagnosis not present

## 2014-12-13 DIAGNOSIS — J3089 Other allergic rhinitis: Secondary | ICD-10-CM | POA: Diagnosis not present

## 2014-12-20 DIAGNOSIS — J301 Allergic rhinitis due to pollen: Secondary | ICD-10-CM | POA: Diagnosis not present

## 2014-12-20 DIAGNOSIS — J3081 Allergic rhinitis due to animal (cat) (dog) hair and dander: Secondary | ICD-10-CM | POA: Diagnosis not present

## 2014-12-20 DIAGNOSIS — J3089 Other allergic rhinitis: Secondary | ICD-10-CM | POA: Diagnosis not present

## 2014-12-27 DIAGNOSIS — J3089 Other allergic rhinitis: Secondary | ICD-10-CM | POA: Diagnosis not present

## 2014-12-27 DIAGNOSIS — J301 Allergic rhinitis due to pollen: Secondary | ICD-10-CM | POA: Diagnosis not present

## 2015-01-03 DIAGNOSIS — J301 Allergic rhinitis due to pollen: Secondary | ICD-10-CM | POA: Diagnosis not present

## 2015-01-03 DIAGNOSIS — J3089 Other allergic rhinitis: Secondary | ICD-10-CM | POA: Diagnosis not present

## 2015-01-15 DIAGNOSIS — J3089 Other allergic rhinitis: Secondary | ICD-10-CM | POA: Diagnosis not present

## 2015-01-15 DIAGNOSIS — J301 Allergic rhinitis due to pollen: Secondary | ICD-10-CM | POA: Diagnosis not present

## 2015-01-18 ENCOUNTER — Other Ambulatory Visit: Payer: Self-pay | Admitting: *Deleted

## 2015-01-24 DIAGNOSIS — J301 Allergic rhinitis due to pollen: Secondary | ICD-10-CM | POA: Diagnosis not present

## 2015-01-24 DIAGNOSIS — J3089 Other allergic rhinitis: Secondary | ICD-10-CM | POA: Diagnosis not present

## 2015-01-25 ENCOUNTER — Encounter: Payer: Self-pay | Admitting: Family Medicine

## 2015-01-25 ENCOUNTER — Ambulatory Visit (INDEPENDENT_AMBULATORY_CARE_PROVIDER_SITE_OTHER): Payer: Medicare Other | Admitting: Family Medicine

## 2015-01-25 ENCOUNTER — Ambulatory Visit
Admission: RE | Admit: 2015-01-25 | Discharge: 2015-01-25 | Disposition: A | Discharge status: Home / Self Care | Payer: Medicare Other | Source: Ambulatory Visit | Attending: Family Medicine | Admitting: Family Medicine

## 2015-01-25 ENCOUNTER — Encounter: Payer: Self-pay | Admitting: Emergency Medicine

## 2015-01-25 VITALS — BP 128/68 | HR 72 | Temp 97.8°F | Resp 16 | Ht 59.0 in | Wt 164.0 lb

## 2015-01-25 DIAGNOSIS — M25511 Pain in right shoulder: Secondary | ICD-10-CM

## 2015-01-25 DIAGNOSIS — E119 Type 2 diabetes mellitus without complications: Secondary | ICD-10-CM | POA: Insufficient documentation

## 2015-01-25 DIAGNOSIS — I38 Endocarditis, valve unspecified: Secondary | ICD-10-CM | POA: Insufficient documentation

## 2015-01-25 DIAGNOSIS — E1129 Type 2 diabetes mellitus with other diabetic kidney complication: Secondary | ICD-10-CM | POA: Insufficient documentation

## 2015-01-25 DIAGNOSIS — I10 Essential (primary) hypertension: Secondary | ICD-10-CM | POA: Insufficient documentation

## 2015-01-25 MED ORDER — MELOXICAM 15 MG PO TABS: 15.0000 mg | ORAL_TABLET | Freq: Every day | ORAL | Status: DC

## 2015-01-25 MED ORDER — HYDROCODONE-ACETAMINOPHEN 5-325 MG PO TABS: ORAL_TABLET | ORAL | Status: DC

## 2015-01-25 NOTE — Patient Instructions (Signed)
Stop Aleve while on Meloxicam for your shoulder

## 2015-01-25 NOTE — Progress Notes (Signed)
Subjective:     Patient ID: NIKOLINA SIMERSON, female   DOB: 1950-08-09, 64 y.o.   MRN: [id number]  HPI  Chief Complaint  Patient presents with  . Shoulder Pain  Was seen February for the same pain but never got better with muscle relaxants and Aleve twice daily. Shoulder x-ray c/w degenerative changes. 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   Review of Systems  Musculoskeletal:       States pain is in her right upper back as well as right shoulder: "I can't reach to put deodorant under my left arm."       Objective:   Physical Exam Right shoulder pain increases with internal rotation and flexion > 90 degrees. Tender over her right upper back. Cervical FROM with mild left posterior cervical tenderness.    Assessment:    1. Right shoulder pain - DG Shoulder Right; Future - Ambulatory referral to Orthopedic Surgery - meloxicam (MOBIC) 15 MG tablet; Take 1 tablet (15 mg total) by mouth daily.  Dispense: 30 tablet; Refill: 0 - HYDROcodone-acetaminophen (NORCO/VICODIN) 5-325 MG per tablet; One every 4-6 hours as needed for pain  Dispense: 28 tablet; Refill: 0    Plan:    Further evaluation and treatment per orthopedic surgery.

## 2015-01-31 DIAGNOSIS — J301 Allergic rhinitis due to pollen: Secondary | ICD-10-CM | POA: Diagnosis not present

## 2015-01-31 DIAGNOSIS — J3089 Other allergic rhinitis: Secondary | ICD-10-CM | POA: Diagnosis not present

## 2015-02-05 ENCOUNTER — Other Ambulatory Visit: Payer: Self-pay | Admitting: Family Medicine

## 2015-02-05 DIAGNOSIS — J301 Allergic rhinitis due to pollen: Secondary | ICD-10-CM | POA: Diagnosis not present

## 2015-02-05 DIAGNOSIS — M25511 Pain in right shoulder: Secondary | ICD-10-CM

## 2015-02-05 DIAGNOSIS — J3089 Other allergic rhinitis: Secondary | ICD-10-CM | POA: Diagnosis not present

## 2015-02-05 MED ORDER — HYDROCODONE-ACETAMINOPHEN 5-325 MG PO TABS: ORAL_TABLET | ORAL | Status: DC

## 2015-02-05 NOTE — Telephone Encounter (Signed)
Patient is requesting if she can get a few more of HYDROcodone-acetaminophen (NORCO/VICODIN) 5-325 MG per tablet  until her appt with [facility] the appt is on the 18th, she also wanted to know if she can take Tyleneol with it also Cb# [phone] or [phone]

## 2015-02-05 NOTE — Telephone Encounter (Signed)
review 

## 2015-02-05 NOTE — Telephone Encounter (Signed)
Patient advised ill pick up Rx in morning.

## 2015-02-05 NOTE — Telephone Encounter (Signed)
Hydrocodone rx up front for pickup. Continue meloxicam but do not add any more Tylenol.

## 2015-02-11 DIAGNOSIS — M7542 Impingement syndrome of left shoulder: Secondary | ICD-10-CM | POA: Diagnosis not present

## 2015-02-11 DIAGNOSIS — M7541 Impingement syndrome of right shoulder: Secondary | ICD-10-CM | POA: Diagnosis not present

## 2015-02-14 DIAGNOSIS — J301 Allergic rhinitis due to pollen: Secondary | ICD-10-CM | POA: Diagnosis not present

## 2015-02-14 DIAGNOSIS — J3089 Other allergic rhinitis: Secondary | ICD-10-CM | POA: Diagnosis not present

## 2015-02-21 DIAGNOSIS — J301 Allergic rhinitis due to pollen: Secondary | ICD-10-CM | POA: Diagnosis not present

## 2015-02-21 DIAGNOSIS — J3089 Other allergic rhinitis: Secondary | ICD-10-CM | POA: Diagnosis not present

## 2015-03-01 DIAGNOSIS — J3089 Other allergic rhinitis: Secondary | ICD-10-CM | POA: Diagnosis not present

## 2015-03-01 DIAGNOSIS — J301 Allergic rhinitis due to pollen: Secondary | ICD-10-CM | POA: Diagnosis not present

## 2015-03-05 DIAGNOSIS — J301 Allergic rhinitis due to pollen: Secondary | ICD-10-CM | POA: Diagnosis not present

## 2015-03-05 DIAGNOSIS — J3089 Other allergic rhinitis: Secondary | ICD-10-CM | POA: Diagnosis not present

## 2015-03-10 ENCOUNTER — Other Ambulatory Visit: Payer: Self-pay | Admitting: Family Medicine

## 2015-03-14 DIAGNOSIS — J3089 Other allergic rhinitis: Secondary | ICD-10-CM | POA: Diagnosis not present

## 2015-03-14 DIAGNOSIS — J301 Allergic rhinitis due to pollen: Secondary | ICD-10-CM | POA: Diagnosis not present

## 2015-03-21 DIAGNOSIS — J3089 Other allergic rhinitis: Secondary | ICD-10-CM | POA: Diagnosis not present

## 2015-03-21 DIAGNOSIS — J301 Allergic rhinitis due to pollen: Secondary | ICD-10-CM | POA: Diagnosis not present

## 2015-03-26 DIAGNOSIS — J3089 Other allergic rhinitis: Secondary | ICD-10-CM | POA: Diagnosis not present

## 2015-03-26 DIAGNOSIS — M7501 Adhesive capsulitis of right shoulder: Secondary | ICD-10-CM | POA: Diagnosis not present

## 2015-03-26 DIAGNOSIS — J301 Allergic rhinitis due to pollen: Secondary | ICD-10-CM | POA: Diagnosis not present

## 2015-03-27 ENCOUNTER — Ambulatory Visit: Payer: Self-pay | Admitting: Family Medicine

## 2015-03-29 DIAGNOSIS — M542 Cervicalgia: Secondary | ICD-10-CM | POA: Insufficient documentation

## 2015-03-29 DIAGNOSIS — R5383 Other fatigue: Secondary | ICD-10-CM | POA: Insufficient documentation

## 2015-03-29 DIAGNOSIS — R609 Edema, unspecified: Secondary | ICD-10-CM | POA: Insufficient documentation

## 2015-03-29 DIAGNOSIS — E11620 Type 2 diabetes mellitus with diabetic dermatitis: Secondary | ICD-10-CM | POA: Insufficient documentation

## 2015-03-29 DIAGNOSIS — K2950 Unspecified chronic gastritis without bleeding: Secondary | ICD-10-CM

## 2015-03-29 DIAGNOSIS — J309 Allergic rhinitis, unspecified: Secondary | ICD-10-CM | POA: Insufficient documentation

## 2015-03-29 DIAGNOSIS — M25519 Pain in unspecified shoulder: Secondary | ICD-10-CM | POA: Insufficient documentation

## 2015-03-29 DIAGNOSIS — R9431 Abnormal electrocardiogram [ECG] [EKG]: Secondary | ICD-10-CM | POA: Insufficient documentation

## 2015-03-29 DIAGNOSIS — Z86010 Personal history of colonic polyps: Secondary | ICD-10-CM | POA: Insufficient documentation

## 2015-03-29 DIAGNOSIS — B9681 Helicobacter pylori [H. pylori] as the cause of diseases classified elsewhere: Secondary | ICD-10-CM | POA: Insufficient documentation

## 2015-03-29 DIAGNOSIS — T782XXA Anaphylactic shock, unspecified, initial encounter: Secondary | ICD-10-CM | POA: Insufficient documentation

## 2015-03-29 DIAGNOSIS — Z8619 Personal history of other infectious and parasitic diseases: Secondary | ICD-10-CM | POA: Insufficient documentation

## 2015-03-29 HISTORY — DX: Anaphylactic shock, unspecified, initial encounter: T78.2XXA

## 2015-03-29 HISTORY — DX: Helicobacter pylori (H. pylori) as the cause of diseases classified elsewhere: B96.81

## 2015-03-29 HISTORY — DX: Unspecified chronic gastritis without bleeding: K29.50

## 2015-04-04 DIAGNOSIS — J3089 Other allergic rhinitis: Secondary | ICD-10-CM | POA: Diagnosis not present

## 2015-04-04 DIAGNOSIS — J301 Allergic rhinitis due to pollen: Secondary | ICD-10-CM | POA: Diagnosis not present

## 2015-04-05 ENCOUNTER — Ambulatory Visit (INDEPENDENT_AMBULATORY_CARE_PROVIDER_SITE_OTHER): Payer: Medicare Other | Admitting: Family Medicine

## 2015-04-05 ENCOUNTER — Encounter: Payer: Self-pay | Admitting: Family Medicine

## 2015-04-05 VITALS — BP 142/72 | HR 58 | Temp 98.0°F | Resp 16 | Ht 60.5 in | Wt 162.0 lb

## 2015-04-05 DIAGNOSIS — Z23 Encounter for immunization: Secondary | ICD-10-CM

## 2015-04-05 DIAGNOSIS — M25511 Pain in right shoulder: Secondary | ICD-10-CM

## 2015-04-05 DIAGNOSIS — M79604 Pain in right leg: Secondary | ICD-10-CM | POA: Diagnosis not present

## 2015-04-05 DIAGNOSIS — I10 Essential (primary) hypertension: Secondary | ICD-10-CM | POA: Diagnosis not present

## 2015-04-05 DIAGNOSIS — E119 Type 2 diabetes mellitus without complications: Secondary | ICD-10-CM | POA: Diagnosis not present

## 2015-04-05 LAB — POCT GLYCOSYLATED HEMOGLOBIN (HGB A1C)
Est. average glucose Bld gHb Est-mCnc: 154
Hemoglobin A1C: 7

## 2015-04-05 NOTE — Progress Notes (Signed)
Patient: Sharon Nelson Female    DOB: 1950/08/25   64 y.o.   MRN: [id number] Visit Date: 04/05/2015  Today's Provider: [name], MD   Chief Complaint  Patient presents with  . Diabetes  . Hypertension   Leg Pain   Subjective:    HPI      Diabetes Mellitus Type II, Follow-up:   Lab Results  Component Value Date   HGBA1C 7.6* 11/22/2014   HGBA1C 7.8* 02/10/2014    Last seen for diabetes 5 months ago.  Management since then includes increasing Metformin to 2 pills twice daily. She reports good compliance with treatment. She is not having side effects.  Current symptoms include none and have been stable. Home blood sugar records: fasting range: 130-140  Episodes of hypoglycemia? no   Current Insulin Regimen:  None  Most Recent Eye Exam: > 1 year Weight trend: stable Prior visit with dietician: no Current diet: in general, a "healthy" diet   Current exercise: none  Pertinent Labs:    Component Value Date/Time   CHOL 176 08/23/2014   TRIG 269* 08/23/2014   CREATININE 0.9 11/22/2014   CREATININE 1.04 02/12/2014 0353    Wt Readings from Last 3 Encounters:  01/25/15 164 lb (74.39 kg)   She reports that she finished 6 days course of prednisone just yesterday, which was prescribed by orthopedist for her right shoulder pain. She also had steroid injection about a month ago, but shoulder is still bothering her quite a bit.  ------------------------------------------------------------------------   Hypertension, follow-up:  BP Readings from Last 3 Encounters:  01/25/15 128/68    She was last seen for hypertension 5 months ago.  BP at that visit was  140/72. Management since that visit includes  none. She reports good compliance with treatment. She is not having side effects.  She is not exercising. She outside blood pressures are  Not being checked. She is experiencing none.  Patient denies chest pain, chest pressure/discomfort, claudication,  dyspnea, exertional chest pressure/discomfort, fatigue, irregular heart beat and lower extremity edema.   Cardiovascular risk factors include advanced age (older than 70 for men, 48 for women), diabetes mellitus and hypertension.  Use of agents associated with hypertension: NSAIDS.     Weight trend: stable Wt Readings from Last 3 Encounters:  01/25/15 164 lb (74.39 kg)    Current diet: in general, a "healthy" diet    ------------------------------------------------------------------------  Follow up Vitamin D deficiency: Last office visit was 5 months ago and no changes were made. Patient reports good compliance with treatment and good tolerance.   Leg pain She has long history of right anterior lower leg pain secondary to previous fracture. She states it has been bothering her more lately and is considering getting back in with her orthopedist.   Allergies  Allergen Reactions  . Lisinopril Anaphylaxis and Swelling    WAS ON LIFE SUPPORT   . Iodinated Diagnostic Agents Rash    IV dye, red dye Other Reaction: feeling of heat  . Cinnamon Swelling    Other reaction(s): Swollen lips  . Peanut Oil     Other reaction(s): Swelling  . Penicillins Swelling    Other reaction(s): Swelling  . Sulfa Antibiotics Swelling    Other reaction(s): Swelling  . Verapamil Hcl Er     Swelling  . Milk Protein Rash   Previous Medications   ALBUTEROL (PROAIR HFA) 108 (90 BASE) MCG/ACT INHALER    Inhale into the lungs.   ASPIRIN  81 MG TABLET    Take by mouth.   BECLOMETHASONE (QVAR) 40 MCG/ACT INHALER    Inhale into the lungs.   BUDESONIDE-FORMOTEROL (SYMBICORT) 160-4.5 MCG/ACT INHALER    Inhale into the lungs.   CHOLECALCIFEROL 2000 UNITS CAPS    Take by mouth.   EPINEPHRINE 0.3 MG/0.3 ML IJ SOAJ INJECTION       ESTRADIOL (ESTRACE) 0.5 MG TABLET       GABAPENTIN (NEURONTIN) 600 MG TABLET    Take by mouth.   GLUCOSE BLOOD (COOL BLOOD GLUCOSE TEST STRIPS VI)       GLUCOSE BLOOD TEST STRIP        LEVOCETIRIZINE (XYZAL) 5 MG TABLET    Take by mouth.   LOVASTATIN (MEVACOR) 40 MG TABLET    Take 1 tablet by mouth at bedtime   MELOXICAM (MOBIC) 15 MG TABLET    Take 1 tablet (15 mg total) by mouth daily.   METFORMIN (GLUCOPHAGE) 500 MG TABLET    take 2 tablets by mouth twice a day   METHOCARBAMOL (ROBAXIN) 500 MG TABLET    Take by mouth.   SPIRONOLACTONE (ALDACTONE) 100 MG TABLET    Take 100 mg by mouth daily.     Review of Systems  Constitutional: Negative for fever, chills, appetite change and fatigue.  Respiratory: Negative for chest tightness and shortness of breath.   Cardiovascular: Negative for chest pain and palpitations.  Gastrointestinal: Negative for nausea, vomiting and abdominal pain.  Musculoskeletal: Positive for arthralgias (in knees shoulders and llower egs).  Neurological: Negative for dizziness and weakness.    Social History  Substance Use Topics  . Smoking status: Former Smoker -- 22 years  . Smokeless tobacco: Not on file  . Alcohol Use: No   Objective:   BP 142/72 mmHg  Pulse 58  Temp(Src) 98 F (36.7 C) (Oral)  Resp 16  Ht 5' 0.5" (1.537 m)  Wt 162 lb (73.483 kg)  BMI 31.11 kg/m2  SpO2 97%  Physical Exam  General Appearance:    Alert, cooperative, no distress, obese  Eyes:    PERRL, conjunctiva/corneas clear, EOM's intact       Lungs:     Clear to auscultation bilaterally, respirations unlabored  Heart:    Regular rate and rhythm  Neurologic:   Awake, alert, oriented x 3. No apparent focal neurological           defect.       Results for orders placed or performed in visit on 04/05/15  POCT HgB A1C  Result Value Ref Range   Hemoglobin A1C 7.0    Est. average glucose Bld gHb Est-mCnc 154        Assessment & Plan:     1. Type 2 diabetes mellitus without complication Improving. Continue current medications.   - POCT HgB A1C  2. Essential (primary) hypertension well controlled Continue current medications.    3. Right shoulder  pain Continue follow up with orthopedist.   4. Need for influenza vaccination  - Flu vaccine HIGH DOSE PF  5. Right anterior leg pain.  Secondary to prior fracture. Consider referral back to orthopedist for follow up.       [name], MD  [facility]

## 2015-04-06 ENCOUNTER — Other Ambulatory Visit: Payer: Self-pay | Admitting: Family Medicine

## 2015-04-09 DIAGNOSIS — J3089 Other allergic rhinitis: Secondary | ICD-10-CM | POA: Diagnosis not present

## 2015-04-09 DIAGNOSIS — J301 Allergic rhinitis due to pollen: Secondary | ICD-10-CM | POA: Diagnosis not present

## 2015-04-12 DIAGNOSIS — J301 Allergic rhinitis due to pollen: Secondary | ICD-10-CM | POA: Diagnosis not present

## 2015-04-12 DIAGNOSIS — J3089 Other allergic rhinitis: Secondary | ICD-10-CM | POA: Diagnosis not present

## 2015-04-16 ENCOUNTER — Ambulatory Visit: Payer: Medicare Other | Attending: Orthopedic Surgery

## 2015-04-16 DIAGNOSIS — R531 Weakness: Secondary | ICD-10-CM | POA: Insufficient documentation

## 2015-04-16 DIAGNOSIS — M25511 Pain in right shoulder: Secondary | ICD-10-CM | POA: Insufficient documentation

## 2015-04-16 DIAGNOSIS — M25512 Pain in left shoulder: Secondary | ICD-10-CM | POA: Insufficient documentation

## 2015-04-16 DIAGNOSIS — J301 Allergic rhinitis due to pollen: Secondary | ICD-10-CM | POA: Diagnosis not present

## 2015-04-16 DIAGNOSIS — M7501 Adhesive capsulitis of right shoulder: Secondary | ICD-10-CM | POA: Diagnosis not present

## 2015-04-16 DIAGNOSIS — J3089 Other allergic rhinitis: Secondary | ICD-10-CM | POA: Diagnosis not present
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
## 2015-04-16 NOTE — Patient Instructions (Addendum)
    Scapular Retraction (Standing)   Clasp both hands, pinch shoulder blades together. Hold for 5 seconds. Repeat __10__ times per set. Do __3__ sets per session.     Copyright  VHI. All rights reserved.   Improved exercise technique, movement at target joints, use of target muscles after mod to max verbal, visual, tactile cues.

## 2015-04-18 ENCOUNTER — Ambulatory Visit: Payer: Medicare Other

## 2015-04-18 DIAGNOSIS — M7501 Adhesive capsulitis of right shoulder: Secondary | ICD-10-CM | POA: Diagnosis not present

## 2015-04-18 DIAGNOSIS — M25511 Pain in right shoulder: Secondary | ICD-10-CM | POA: Diagnosis not present

## 2015-04-18 DIAGNOSIS — R531 Weakness: Secondary | ICD-10-CM

## 2015-04-18 DIAGNOSIS — M25512 Pain in left shoulder: Secondary | ICD-10-CM | POA: Diagnosis not present
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
## 2015-04-18 NOTE — Patient Instructions (Addendum)
Scapular Retraction: Rowing (Eccentric) - Arms - 45 Degrees (Resistance Band)   Hold end of band in each hand. Pull back until elbows are even with trunk. Keep elbows out from sides at 45, thumbs up.  Hold for 5 seconds. Slowly release for 3-5 seconds. Use ________ resistance band. _10__ reps per set, _3__ sets per day. Copyright  VHI. All rights reserved.     Holding wand with left hand palm up, push out from body with other hand, palm down. Keep both elbows bent. When stretch is felt, hold __5__ seconds. Repeat to other side, leading with same hand. Keep elbows bent. Repeat _10___ times per set. Do __3__ sets per session.  http://orth.exer.us/748   Copyright  VHI. All rights reserved.    External Rotation (Eccentric), (Resistance Band)    Pull band outward with forearm of affected arm. Keep elbows at sides, wrists neutral. Slowly return to start for 3-5 seconds. Use ________ resistance band. Hint: Use towel roll between elbow and hip. __10_ reps per set, __3_ sets per day. Copyright  VHI. All rights reserved.    Pain free range.   Gave supine bilateral shoulder flexion to 90 degrees with ER resistance from yellow band 10x3 pain free range as part of her HEP. Pt demonstrated and verbalized understanding.

## 2015-04-23 ENCOUNTER — Ambulatory Visit: Payer: Medicare Other

## 2015-04-23 DIAGNOSIS — M25512 Pain in left shoulder: Secondary | ICD-10-CM

## 2015-04-23 DIAGNOSIS — M25511 Pain in right shoulder: Secondary | ICD-10-CM | POA: Diagnosis not present

## 2015-04-23 DIAGNOSIS — M7501 Adhesive capsulitis of right shoulder: Secondary | ICD-10-CM | POA: Diagnosis not present

## 2015-04-23 DIAGNOSIS — R531 Weakness: Secondary | ICD-10-CM

## 2015-04-23 DIAGNOSIS — E782 Mixed hyperlipidemia: Secondary | ICD-10-CM | POA: Diagnosis not present

## 2015-04-23 DIAGNOSIS — I10 Essential (primary) hypertension: Secondary | ICD-10-CM | POA: Diagnosis not present
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
## 2015-04-25 ENCOUNTER — Ambulatory Visit: Payer: Medicare Other

## 2015-04-25 DIAGNOSIS — M7501 Adhesive capsulitis of right shoulder: Secondary | ICD-10-CM | POA: Diagnosis not present

## 2015-04-25 DIAGNOSIS — M25512 Pain in left shoulder: Secondary | ICD-10-CM

## 2015-04-25 DIAGNOSIS — M25511 Pain in right shoulder: Secondary | ICD-10-CM

## 2015-04-25 DIAGNOSIS — R531 Weakness: Secondary | ICD-10-CM | POA: Diagnosis not present
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
## 2015-04-30 ENCOUNTER — Ambulatory Visit: Payer: Medicare Other | Attending: Orthopedic Surgery

## 2015-04-30 DIAGNOSIS — M25511 Pain in right shoulder: Secondary | ICD-10-CM | POA: Diagnosis not present

## 2015-04-30 DIAGNOSIS — R531 Weakness: Secondary | ICD-10-CM | POA: Diagnosis not present

## 2015-04-30 DIAGNOSIS — M25512 Pain in left shoulder: Secondary | ICD-10-CM | POA: Diagnosis not present

## 2015-04-30 DIAGNOSIS — M7501 Adhesive capsulitis of right shoulder: Secondary | ICD-10-CM | POA: Diagnosis not present
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
## 2015-04-30 NOTE — Patient Instructions (Signed)
(  Home) Extension: Thoracic With Lumbar Lock - Sitting    Sit with back against chair, knees bent, hands folded. Lean trunk over chair back to feel a comfortable stretch at your upper back.  Repeat __10__ times per set. Do __3__ sets per session.   Copyright  VHI. All rights reserved.

## 2015-05-02 DIAGNOSIS — J3089 Other allergic rhinitis: Secondary | ICD-10-CM | POA: Diagnosis not present

## 2015-05-02 DIAGNOSIS — J301 Allergic rhinitis due to pollen: Secondary | ICD-10-CM | POA: Diagnosis not present

## 2015-05-07 ENCOUNTER — Ambulatory Visit: Payer: Medicare Other

## 2015-05-07 DIAGNOSIS — R531 Weakness: Secondary | ICD-10-CM | POA: Diagnosis not present

## 2015-05-07 DIAGNOSIS — M7501 Adhesive capsulitis of right shoulder: Secondary | ICD-10-CM | POA: Diagnosis not present

## 2015-05-07 DIAGNOSIS — M25512 Pain in left shoulder: Secondary | ICD-10-CM

## 2015-05-07 DIAGNOSIS — M25511 Pain in right shoulder: Secondary | ICD-10-CM | POA: Diagnosis not present

## 2015-05-07 NOTE — Patient Instructions (Signed)
Scapular: Protraction - 90 of Flexion    Wrap red band around behind you with each hand holding one end of the band,  attempt to push right  Arm (only)  up toward ceiling, keeping elbows straight and back against floor. Hold for 5 seconds.  Repeat _10___ times per set. Do _3___ sets per session.   [email or web address]   Copyright  VHI. All rights reserved.
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
## 2015-05-08 DIAGNOSIS — M7501 Adhesive capsulitis of right shoulder: Secondary | ICD-10-CM | POA: Diagnosis not present

## 2015-05-09 ENCOUNTER — Ambulatory Visit: Payer: Medicare Other

## 2015-05-09 DIAGNOSIS — M25512 Pain in left shoulder: Secondary | ICD-10-CM

## 2015-05-09 DIAGNOSIS — M25511 Pain in right shoulder: Secondary | ICD-10-CM

## 2015-05-09 DIAGNOSIS — M7501 Adhesive capsulitis of right shoulder: Secondary | ICD-10-CM

## 2015-05-09 DIAGNOSIS — R531 Weakness: Secondary | ICD-10-CM
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
## 2015-05-16 DIAGNOSIS — J3089 Other allergic rhinitis: Secondary | ICD-10-CM | POA: Diagnosis not present

## 2015-05-16 DIAGNOSIS — J301 Allergic rhinitis due to pollen: Secondary | ICD-10-CM | POA: Diagnosis not present

## 2015-05-23 DIAGNOSIS — J3089 Other allergic rhinitis: Secondary | ICD-10-CM | POA: Diagnosis not present

## 2015-05-23 DIAGNOSIS — J301 Allergic rhinitis due to pollen: Secondary | ICD-10-CM | POA: Diagnosis not present

## 2015-05-28 DIAGNOSIS — J301 Allergic rhinitis due to pollen: Secondary | ICD-10-CM | POA: Diagnosis not present

## 2015-05-28 DIAGNOSIS — J3089 Other allergic rhinitis: Secondary | ICD-10-CM | POA: Diagnosis not present

## 2015-06-07 DIAGNOSIS — J3089 Other allergic rhinitis: Secondary | ICD-10-CM | POA: Diagnosis not present

## 2015-06-07 DIAGNOSIS — J301 Allergic rhinitis due to pollen: Secondary | ICD-10-CM | POA: Diagnosis not present

## 2015-06-18 DIAGNOSIS — J3089 Other allergic rhinitis: Secondary | ICD-10-CM | POA: Diagnosis not present

## 2015-06-18 DIAGNOSIS — J301 Allergic rhinitis due to pollen: Secondary | ICD-10-CM | POA: Diagnosis not present

## 2015-06-25 DIAGNOSIS — J301 Allergic rhinitis due to pollen: Secondary | ICD-10-CM | POA: Diagnosis not present

## 2015-06-25 DIAGNOSIS — J3089 Other allergic rhinitis: Secondary | ICD-10-CM | POA: Diagnosis not present

## 2015-07-02 LAB — HM DIABETES EYE EXAM

## 2015-07-08 ENCOUNTER — Other Ambulatory Visit: Payer: Self-pay | Admitting: Orthopedic Surgery

## 2015-07-10 ENCOUNTER — Other Ambulatory Visit: Payer: Self-pay | Admitting: Orthopedic Surgery

## 2015-07-10 DIAGNOSIS — M7501 Adhesive capsulitis of right shoulder: Secondary | ICD-10-CM

## 2015-07-11 DIAGNOSIS — J301 Allergic rhinitis due to pollen: Secondary | ICD-10-CM | POA: Diagnosis not present

## 2015-07-11 DIAGNOSIS — J3089 Other allergic rhinitis: Secondary | ICD-10-CM | POA: Diagnosis not present

## 2015-07-16 ENCOUNTER — Ambulatory Visit: Payer: Medicare Other

## 2015-07-17 ENCOUNTER — Encounter: Payer: Self-pay | Admitting: *Deleted

## 2015-07-18 ENCOUNTER — Ambulatory Visit
Admission: RE | Admit: 2015-07-18 | Discharge: 2015-07-18 | Disposition: A | Discharge status: Home / Self Care | Payer: Medicare Other | Source: Ambulatory Visit | Attending: Orthopedic Surgery | Admitting: Orthopedic Surgery

## 2015-07-18 DIAGNOSIS — J3089 Other allergic rhinitis: Secondary | ICD-10-CM | POA: Diagnosis not present

## 2015-07-18 DIAGNOSIS — J301 Allergic rhinitis due to pollen: Secondary | ICD-10-CM | POA: Diagnosis not present

## 2015-07-18 DIAGNOSIS — M7501 Adhesive capsulitis of right shoulder: Secondary | ICD-10-CM | POA: Insufficient documentation

## 2015-07-18 HISTORY — DX: Myoneural disorder, unspecified: G70.9

## 2015-07-18 HISTORY — DX: Unspecified osteoarthritis, unspecified site: M19.90

## 2015-07-18 HISTORY — DX: Cardiac arrhythmia, unspecified: I49.9

## 2015-07-18 MED ORDER — IOHEXOL 180 MG/ML  SOLN: 5.0000 mL | Freq: Once | INTRAMUSCULAR | Status: DC | PRN

## 2015-07-18 NOTE — Discharge Instructions (Signed)
Review paper post procedure instructions.

## 2015-07-19 ENCOUNTER — Other Ambulatory Visit: Payer: Self-pay | Admitting: Family Medicine

## 2015-07-19 DIAGNOSIS — I10 Essential (primary) hypertension: Secondary | ICD-10-CM

## 2015-07-26 DIAGNOSIS — J301 Allergic rhinitis due to pollen: Secondary | ICD-10-CM | POA: Diagnosis not present

## 2015-07-26 DIAGNOSIS — J3089 Other allergic rhinitis: Secondary | ICD-10-CM | POA: Diagnosis not present

## 2015-08-01 DIAGNOSIS — J301 Allergic rhinitis due to pollen: Secondary | ICD-10-CM | POA: Diagnosis not present

## 2015-08-01 DIAGNOSIS — J3089 Other allergic rhinitis: Secondary | ICD-10-CM | POA: Diagnosis not present

## 2015-08-09 DIAGNOSIS — J301 Allergic rhinitis due to pollen: Secondary | ICD-10-CM | POA: Diagnosis not present

## 2015-08-09 DIAGNOSIS — J3089 Other allergic rhinitis: Secondary | ICD-10-CM | POA: Diagnosis not present

## 2015-08-15 DIAGNOSIS — J301 Allergic rhinitis due to pollen: Secondary | ICD-10-CM | POA: Diagnosis not present

## 2015-08-15 DIAGNOSIS — J3089 Other allergic rhinitis: Secondary | ICD-10-CM | POA: Diagnosis not present

## 2015-08-16 ENCOUNTER — Ambulatory Visit: Payer: Medicare Other | Admitting: Family Medicine

## 2015-08-21 ENCOUNTER — Telehealth: Payer: Self-pay | Admitting: Family Medicine

## 2015-08-21 MED ORDER — GABAPENTIN 600 MG PO TABS: 600.0000 mg | ORAL_TABLET | Freq: Three times a day (TID) | ORAL | Status: DC

## 2015-08-21 NOTE — Telephone Encounter (Signed)
Pt needs new Rx for her gabapentin (NEURONTIN) 600 MG tablet (Expired) Taking 07/04/14 07/04/15 Historical Provider, MD Notes: Received from: [place].  Her call back is [phone]  Thanks Con Memos

## 2015-08-23 DIAGNOSIS — J301 Allergic rhinitis due to pollen: Secondary | ICD-10-CM | POA: Diagnosis not present

## 2015-08-23 DIAGNOSIS — J3089 Other allergic rhinitis: Secondary | ICD-10-CM | POA: Diagnosis not present

## 2015-08-27 DIAGNOSIS — J301 Allergic rhinitis due to pollen: Secondary | ICD-10-CM | POA: Diagnosis not present

## 2015-08-27 DIAGNOSIS — J3089 Other allergic rhinitis: Secondary | ICD-10-CM | POA: Diagnosis not present

## 2015-08-30 IMAGING — CR DG CHEST 1V PORT
1 series · 1 of 1 positions shown · non-contrast
Comparison: None.

CLINICAL DATA: Post intubation.

EXAM:
PORTABLE CHEST - 1 VIEW

[ap]
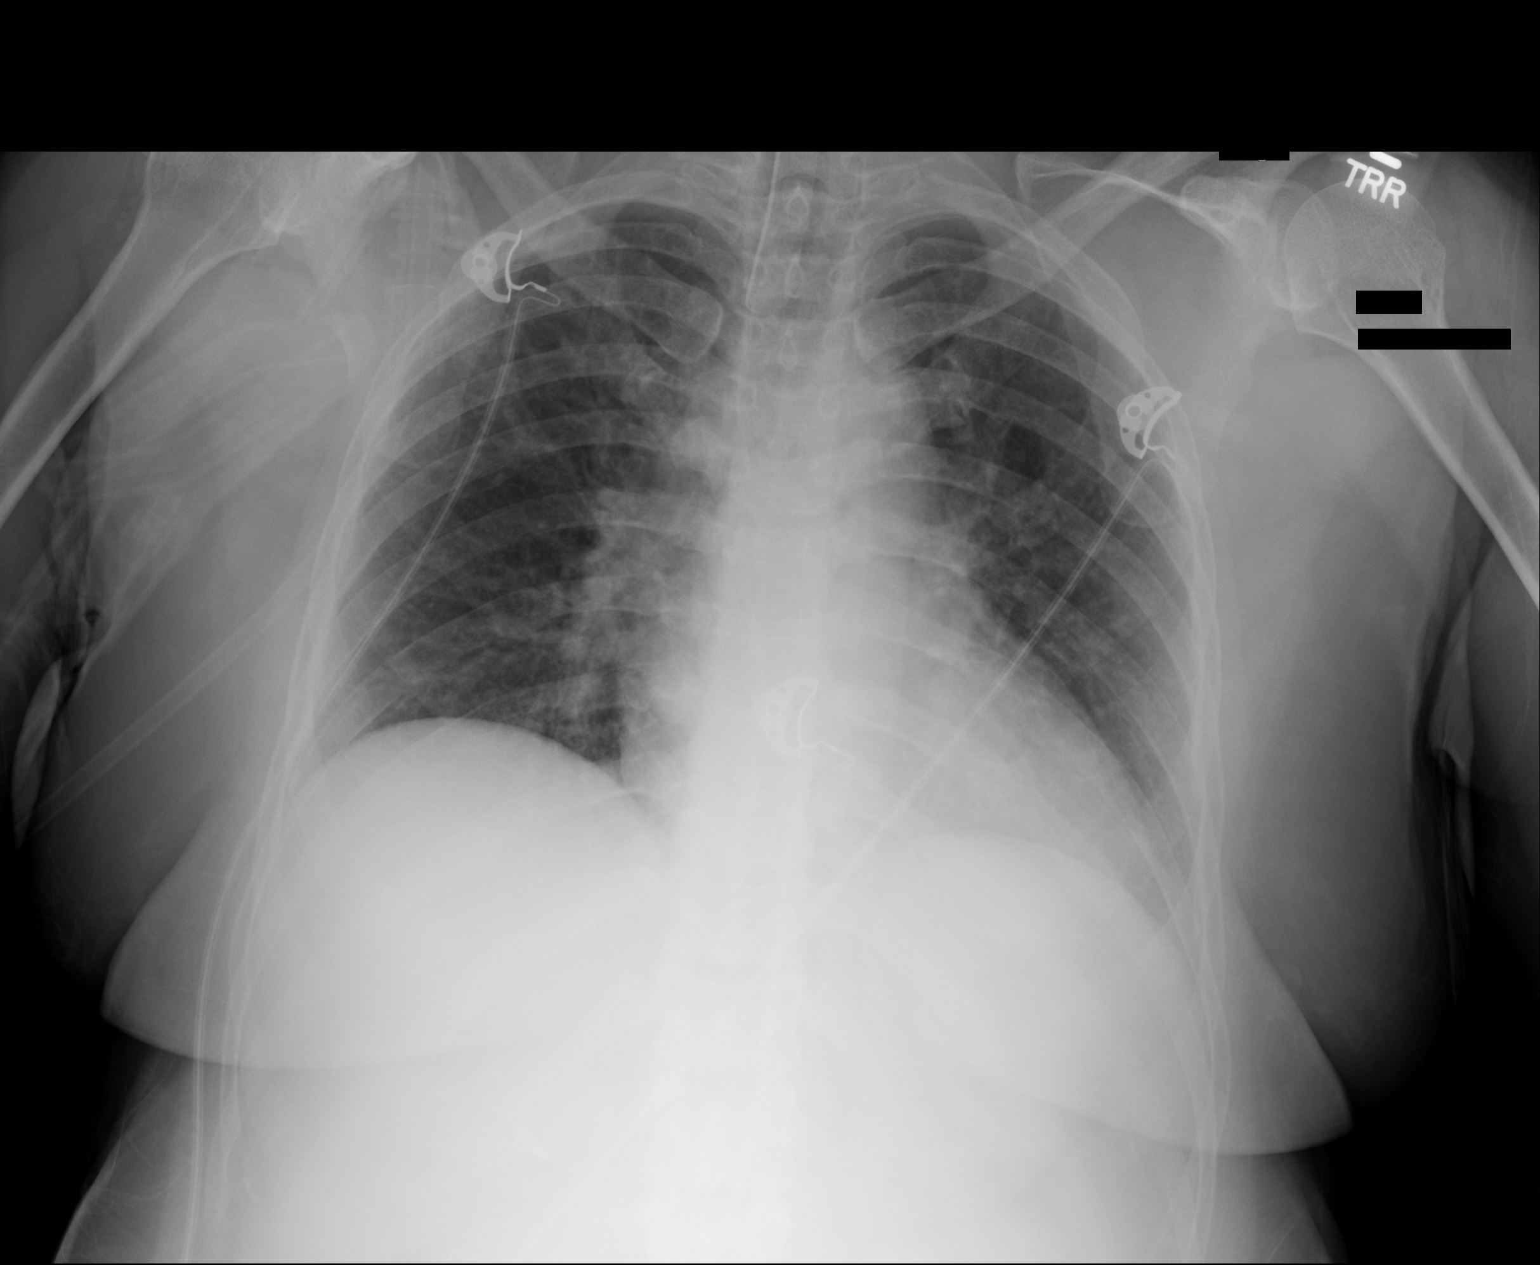

[1 of 1 positions shown; findings below may reference images not displayed]

FINDINGS: Endotracheal tube tip projects 3.8 cm above the carina.

Cardiomediastinal silhouette is unremarkable. Mild central pulmonary
vascular congestion mild interstitial prominence. No pleural
effusions or focal consolidations. No pneumothorax.

Soft tissue planes included osseous structures are nonsuspicious.
Multiple EKG lines overlie the patient and may obscure subtle
underlying pathology. Surgical clips and right abdomen likely
reflect cholecystectomy.
IMPRESSION: Endotracheal tube tip projects 3.8 cm above the carina.

Mild central pulmonary vascular congestion/ interstitial prominence
likely reflects pulmonary edema. No focal consolidation.

  By: Motohide Balsamo

## 2015-08-31 IMAGING — CR DG CHEST 1V PORT
1 series · 1 of 1 positions shown · non-contrast
Comparison: 02/10/2014

CLINICAL DATA: Acute respiratory failure. On ventilator.

EXAM:
PORTABLE CHEST - 1 VIEW

[ap]
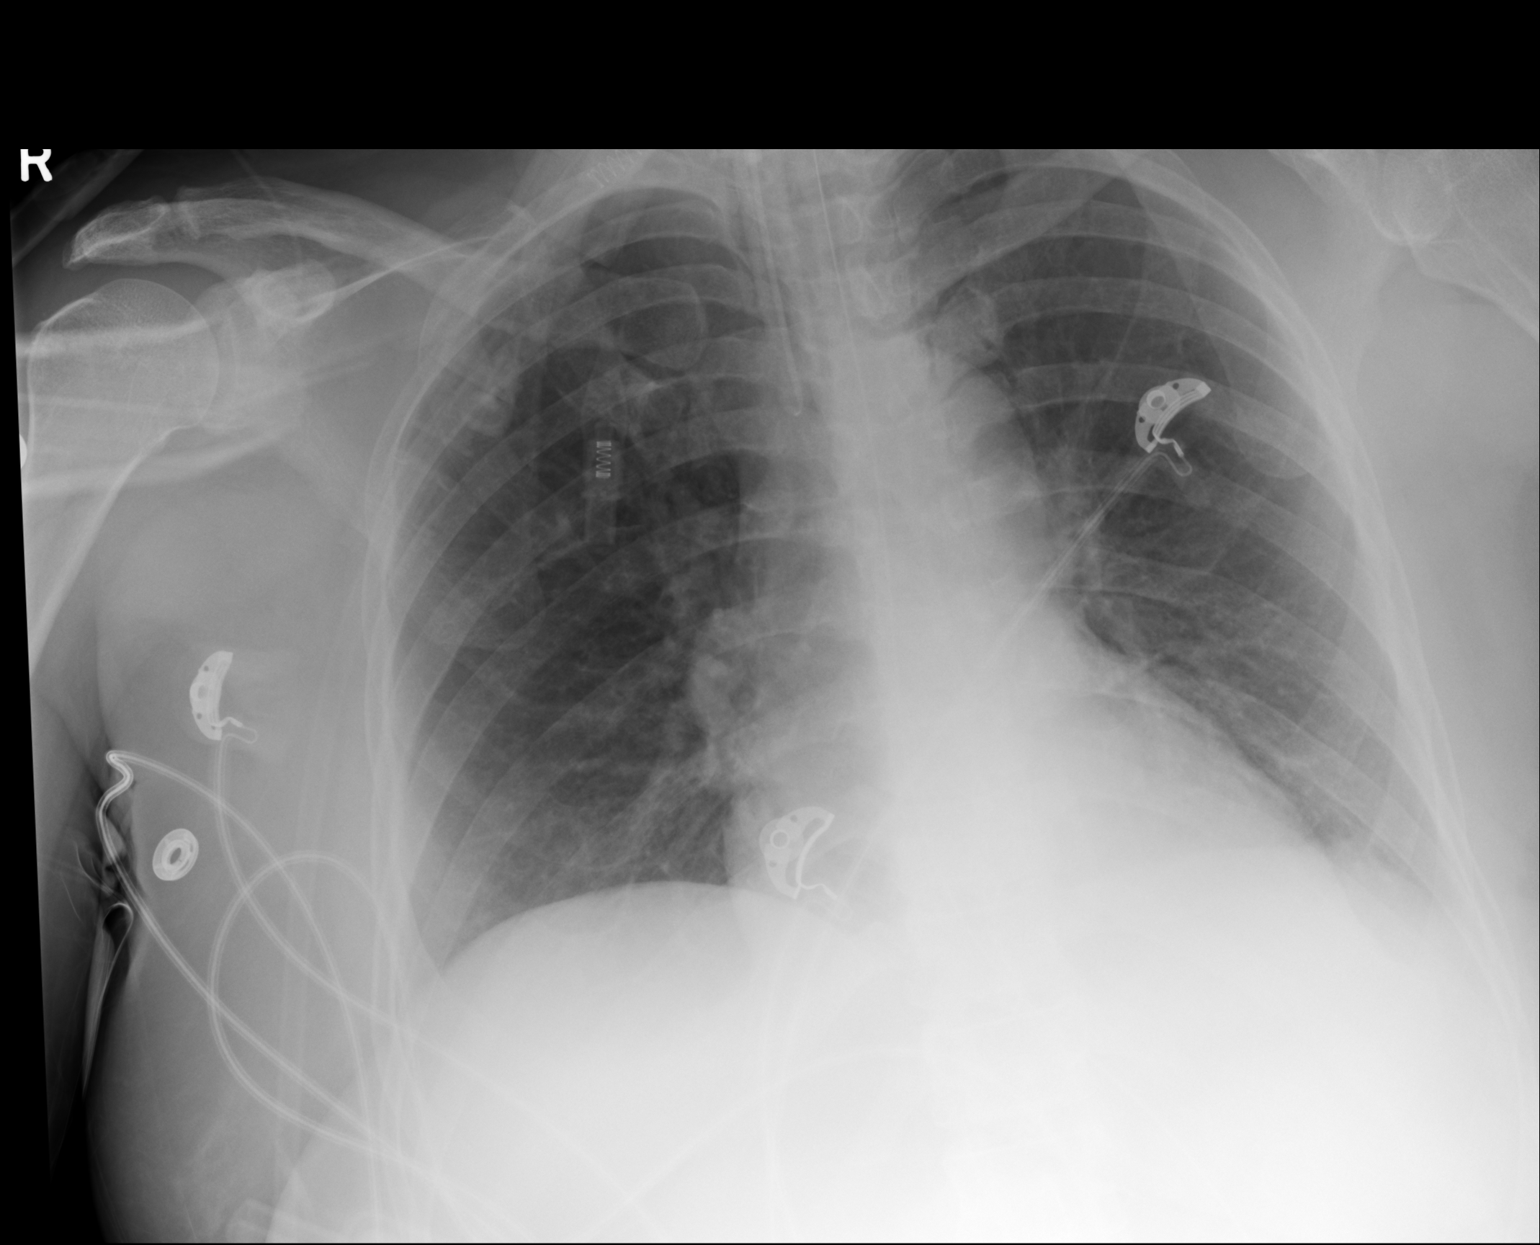

[1 of 1 positions shown; findings below may reference images not displayed]

FINDINGS: A new nasogastric tube is seen entering the stomach. Endotracheal
tube remains in appropriate position.

Low lung volumes are again noted, however both lungs remain clear.
Heart size is normal. No evidence of pneumothorax or pleural
effusion.
IMPRESSION: Endotracheal tube and nasogastric tube in appropriate position. No
acute lung disease.

## 2015-09-03 DIAGNOSIS — J3089 Other allergic rhinitis: Secondary | ICD-10-CM | POA: Diagnosis not present

## 2015-09-03 DIAGNOSIS — J301 Allergic rhinitis due to pollen: Secondary | ICD-10-CM | POA: Diagnosis not present

## 2015-09-12 DIAGNOSIS — J301 Allergic rhinitis due to pollen: Secondary | ICD-10-CM | POA: Diagnosis not present

## 2015-09-12 DIAGNOSIS — J3089 Other allergic rhinitis: Secondary | ICD-10-CM | POA: Diagnosis not present

## 2015-09-19 DIAGNOSIS — J301 Allergic rhinitis due to pollen: Secondary | ICD-10-CM | POA: Diagnosis not present

## 2015-09-19 DIAGNOSIS — J3089 Other allergic rhinitis: Secondary | ICD-10-CM | POA: Diagnosis not present

## 2015-09-26 DIAGNOSIS — J301 Allergic rhinitis due to pollen: Secondary | ICD-10-CM | POA: Diagnosis not present

## 2015-09-26 DIAGNOSIS — J3089 Other allergic rhinitis: Secondary | ICD-10-CM | POA: Diagnosis not present

## 2015-10-03 ENCOUNTER — Ambulatory Visit (INDEPENDENT_AMBULATORY_CARE_PROVIDER_SITE_OTHER): Payer: Medicare Other | Admitting: Family Medicine

## 2015-10-03 ENCOUNTER — Encounter: Payer: Self-pay | Admitting: Family Medicine

## 2015-10-03 VITALS — BP 142/62 | HR 69 | Temp 98.0°F | Resp 16 | Wt 163.0 lb

## 2015-10-03 DIAGNOSIS — J40 Bronchitis, not specified as acute or chronic: Secondary | ICD-10-CM

## 2015-10-03 DIAGNOSIS — J018 Other acute sinusitis: Secondary | ICD-10-CM | POA: Diagnosis not present

## 2015-10-03 MED ORDER — AZITHROMYCIN 250 MG PO TABS: ORAL_TABLET | ORAL | Status: AC

## 2015-10-03 NOTE — Progress Notes (Signed)
Patient: Sharon Nelson Female    DOB: 08-14-1950   65 y.o.   MRN: [id number] Visit Date: 10/03/2015  Today's Provider: [name], MD   Chief Complaint  Patient presents with  . URI   Subjective:    URI  This is a new problem. Episode onset: 2 weeks ago. The problem has been gradually worsening. Maximum temperature: last week; resolved after taking Tylenol. The fever has been present for less than 1 day. Associated symptoms include chest pain (soreness in chest), congestion (nasal and sinus ), coughing (productive with green phlegm), headaches, neck pain, sinus pain and wheezing. Pertinent negatives include no abdominal pain, diarrhea, dysuria, ear pain, joint pain, joint swelling, nausea, plugged ear sensation, rash, rhinorrhea, sneezing, sore throat or vomiting. Treatments tried: Sudafed, Tylenol and NyQuil. The treatment provided mild relief.       Allergies  Allergen Reactions  . Lisinopril Anaphylaxis and Swelling    WAS ON LIFE SUPPORT   . Iodinated Diagnostic Agents Rash    IV dye, red dye Other Reaction: feeling of heat  . Cinnamon Swelling    Other reaction(s): Swollen lips  . Peanut Oil     Other reaction(s): Swelling  . Penicillins Swelling    Other reaction(s): Swelling  . Sulfa Antibiotics Swelling    Other reaction(s): Swelling  . Verapamil Hcl Er     Swelling  . Milk Protein Rash   Previous Medications   ACCU-CHEK AVIVA PLUS TEST STRIP    TEST once daily   ALBUTEROL (PROAIR HFA) 108 (90 BASE) MCG/ACT INHALER    Inhale into the lungs.   ASPIRIN 81 MG TABLET    Take by mouth.   BECLOMETHASONE (QVAR) 40 MCG/ACT INHALER    Inhale into the lungs.   BUDESONIDE-FORMOTEROL (SYMBICORT) 160-4.5 MCG/ACT INHALER    Inhale into the lungs.   CHOLECALCIFEROL 2000 UNITS CAPS    Take by mouth.   EPINEPHRINE 0.3 MG/0.3 ML IJ SOAJ INJECTION       ESTRADIOL (ESTRACE) 0.5 MG TABLET    take 1 tablet by mouth once daily   GABAPENTIN (NEURONTIN) 600 MG TABLET    Take 1  tablet (600 mg total) by mouth 3 (three) times daily.   GLUCOSE BLOOD (COOL BLOOD GLUCOSE TEST STRIPS VI)       LEVOCETIRIZINE (XYZAL) 5 MG TABLET    Take by mouth.   LOVASTATIN (MEVACOR) 40 MG TABLET    Take 1 tablet by mouth at bedtime   MELOXICAM (MOBIC) 15 MG TABLET    Take 1 tablet (15 mg total) by mouth daily.   METFORMIN (GLUCOPHAGE) 500 MG TABLET    take 2 tablets by mouth twice a day   METHOCARBAMOL (ROBAXIN) 500 MG TABLET    Take by mouth.   SPIRONOLACTONE (ALDACTONE) 100 MG TABLET    take 1 tablet by mouth once daily blood pressure and SWELLING    Review of Systems  Constitutional: Positive for fever, diaphoresis and fatigue. Negative for chills and appetite change.  HENT: Positive for congestion (nasal and sinus ), postnasal drip and sinus pressure. Negative for ear pain, mouth sores, nosebleeds, rhinorrhea, sneezing and sore throat.   Eyes: Positive for pain.       Puffy eyes  Respiratory: Positive for cough (productive with green phlegm) and wheezing. Negative for chest tightness and shortness of breath.   Cardiovascular: Positive for chest pain (soreness in chest). Negative for palpitations.  Gastrointestinal: Negative for nausea, vomiting, abdominal pain  and diarrhea.  Genitourinary: Negative for dysuria.  Musculoskeletal: Positive for neck pain. Negative for joint pain.  Skin: Negative for rash.  Neurological: Positive for light-headedness (swimmy headed) and headaches. Negative for weakness.    Social History  Substance Use Topics  . Smoking status: Former Smoker -- 22 years  . Smokeless tobacco: Not on file  . Alcohol Use: No   Objective:   BP 142/62 mmHg  Pulse 69  Temp(Src) 98 F (36.7 C) (Oral)  Resp 16  Wt 163 lb (73.936 kg)  SpO2 97%  Physical Exam  General Appearance:    Alert, cooperative, no distress  HENT:   bilateral TM normal without fluid or infection, neck without nodes, throat normal without erythema or exudate, frontal and maxillary sinus  tender and nasal mucosa congested  Eyes:    PERRL, conjunctiva/corneas clear, EOM's intact       Lungs:     Mild expiratory wheezes, no rales or rhonchi, respirations unlabored  Heart:    Regular rate and rhythm  Neurologic:   Awake, alert, oriented x 3. No apparent focal neurological           defect.           Assessment & Plan:     1. Other acute sinusitis  - azithromycin (ZITHROMAX) 250 MG tablet; 2 by mouth today, then 1 daily for 4 days  Dispense: 6 tablet; Refill: 0  2. Bronchitis Patient Instructions  Recommend OTC Mucinex DM for chest congestion. Can continue Sudafed for the next 1-2 days           [name], MD  [facility]

## 2015-10-03 NOTE — Patient Instructions (Signed)
Recommend OTC Mucinex DM for chest congestion. Can continue Sudafed for the next 1-2 days

## 2015-10-10 ENCOUNTER — Encounter: Payer: Self-pay | Admitting: Family Medicine

## 2015-10-10 ENCOUNTER — Telehealth: Payer: Self-pay | Admitting: Family Medicine

## 2015-10-10 DIAGNOSIS — J4530 Mild persistent asthma, uncomplicated: Secondary | ICD-10-CM | POA: Diagnosis not present

## 2015-10-10 DIAGNOSIS — L209 Atopic dermatitis, unspecified: Secondary | ICD-10-CM | POA: Diagnosis not present

## 2015-10-10 DIAGNOSIS — J3089 Other allergic rhinitis: Secondary | ICD-10-CM | POA: Diagnosis not present

## 2015-10-10 DIAGNOSIS — R51 Headache: Secondary | ICD-10-CM | POA: Diagnosis not present

## 2015-10-10 MED ORDER — DOXYCYCLINE HYCLATE 100 MG PO CAPS: 100.0000 mg | ORAL_CAPSULE | Freq: Two times a day (BID) | ORAL | Status: DC

## 2015-10-10 NOTE — Telephone Encounter (Signed)
Can change to doxycycline 100mg  twice a day for 7 days. Should also use OTC Fluticasone nasal spray once a day

## 2015-10-10 NOTE — Telephone Encounter (Signed)
Please advise 

## 2015-10-10 NOTE — Telephone Encounter (Signed)
Patient advised. RX sent so pharmacy.

## 2015-10-10 NOTE — Telephone Encounter (Signed)
Pt is having really bad sinus pain on the right side making her temple hurt on that side.   Pt has been on the antibiotic zpak and finished.  Seen you last Friday.    Please advise  Call back [phone].  Thanks Con Memos

## 2015-10-22 DIAGNOSIS — J301 Allergic rhinitis due to pollen: Secondary | ICD-10-CM | POA: Diagnosis not present

## 2015-10-22 DIAGNOSIS — J3089 Other allergic rhinitis: Secondary | ICD-10-CM | POA: Diagnosis not present

## 2015-10-24 DIAGNOSIS — J3089 Other allergic rhinitis: Secondary | ICD-10-CM | POA: Diagnosis not present

## 2015-10-24 DIAGNOSIS — J301 Allergic rhinitis due to pollen: Secondary | ICD-10-CM | POA: Diagnosis not present

## 2015-10-31 DIAGNOSIS — J301 Allergic rhinitis due to pollen: Secondary | ICD-10-CM | POA: Diagnosis not present

## 2015-10-31 DIAGNOSIS — J3089 Other allergic rhinitis: Secondary | ICD-10-CM | POA: Diagnosis not present

## 2015-11-07 DIAGNOSIS — J3089 Other allergic rhinitis: Secondary | ICD-10-CM | POA: Diagnosis not present

## 2015-11-07 DIAGNOSIS — J301 Allergic rhinitis due to pollen: Secondary | ICD-10-CM | POA: Diagnosis not present

## 2015-11-14 DIAGNOSIS — J3089 Other allergic rhinitis: Secondary | ICD-10-CM | POA: Diagnosis not present

## 2015-11-14 DIAGNOSIS — J301 Allergic rhinitis due to pollen: Secondary | ICD-10-CM | POA: Diagnosis not present

## 2015-11-21 DIAGNOSIS — J3089 Other allergic rhinitis: Secondary | ICD-10-CM | POA: Diagnosis not present

## 2015-11-21 DIAGNOSIS — J301 Allergic rhinitis due to pollen: Secondary | ICD-10-CM | POA: Diagnosis not present

## 2015-12-05 DIAGNOSIS — J3089 Other allergic rhinitis: Secondary | ICD-10-CM | POA: Diagnosis not present

## 2015-12-05 DIAGNOSIS — J301 Allergic rhinitis due to pollen: Secondary | ICD-10-CM | POA: Diagnosis not present

## 2015-12-05 DIAGNOSIS — J3081 Allergic rhinitis due to animal (cat) (dog) hair and dander: Secondary | ICD-10-CM | POA: Diagnosis not present

## 2015-12-10 DIAGNOSIS — J301 Allergic rhinitis due to pollen: Secondary | ICD-10-CM | POA: Diagnosis not present

## 2015-12-10 DIAGNOSIS — J3089 Other allergic rhinitis: Secondary | ICD-10-CM | POA: Diagnosis not present

## 2015-12-13 DIAGNOSIS — J301 Allergic rhinitis due to pollen: Secondary | ICD-10-CM | POA: Diagnosis not present

## 2015-12-13 DIAGNOSIS — J3089 Other allergic rhinitis: Secondary | ICD-10-CM | POA: Diagnosis not present

## 2015-12-17 DIAGNOSIS — J301 Allergic rhinitis due to pollen: Secondary | ICD-10-CM | POA: Diagnosis not present

## 2015-12-17 DIAGNOSIS — J3089 Other allergic rhinitis: Secondary | ICD-10-CM | POA: Diagnosis not present

## 2015-12-27 DIAGNOSIS — J3089 Other allergic rhinitis: Secondary | ICD-10-CM | POA: Diagnosis not present

## 2015-12-27 DIAGNOSIS — J301 Allergic rhinitis due to pollen: Secondary | ICD-10-CM | POA: Diagnosis not present

## 2016-01-02 ENCOUNTER — Other Ambulatory Visit: Payer: Self-pay | Admitting: Family Medicine

## 2016-01-02 DIAGNOSIS — J3089 Other allergic rhinitis: Secondary | ICD-10-CM | POA: Diagnosis not present

## 2016-01-02 DIAGNOSIS — E1169 Type 2 diabetes mellitus with other specified complication: Secondary | ICD-10-CM

## 2016-01-02 DIAGNOSIS — J301 Allergic rhinitis due to pollen: Secondary | ICD-10-CM | POA: Diagnosis not present

## 2016-01-03 ENCOUNTER — Encounter: Payer: Self-pay | Admitting: *Deleted

## 2016-01-03 DIAGNOSIS — E119 Type 2 diabetes mellitus without complications: Secondary | ICD-10-CM | POA: Diagnosis not present

## 2016-01-03 LAB — HM DIABETES EYE EXAM

## 2016-01-07 DIAGNOSIS — J3089 Other allergic rhinitis: Secondary | ICD-10-CM | POA: Diagnosis not present

## 2016-01-07 DIAGNOSIS — J301 Allergic rhinitis due to pollen: Secondary | ICD-10-CM | POA: Diagnosis not present

## 2016-01-14 DIAGNOSIS — J3089 Other allergic rhinitis: Secondary | ICD-10-CM | POA: Diagnosis not present

## 2016-01-14 DIAGNOSIS — J301 Allergic rhinitis due to pollen: Secondary | ICD-10-CM | POA: Diagnosis not present

## 2016-01-29 ENCOUNTER — Other Ambulatory Visit: Payer: Self-pay | Admitting: Family Medicine

## 2016-01-30 DIAGNOSIS — J3089 Other allergic rhinitis: Secondary | ICD-10-CM | POA: Diagnosis not present

## 2016-01-30 DIAGNOSIS — J301 Allergic rhinitis due to pollen: Secondary | ICD-10-CM | POA: Diagnosis not present

## 2016-01-30 DIAGNOSIS — J3081 Allergic rhinitis due to animal (cat) (dog) hair and dander: Secondary | ICD-10-CM | POA: Diagnosis not present

## 2016-01-30 DIAGNOSIS — H2513 Age-related nuclear cataract, bilateral: Secondary | ICD-10-CM | POA: Diagnosis not present

## 2016-01-31 ENCOUNTER — Encounter: Payer: Self-pay | Admitting: *Deleted

## 2016-01-31 NOTE — Discharge Instructions (Signed)

## 2016-02-04 DIAGNOSIS — J3089 Other allergic rhinitis: Secondary | ICD-10-CM | POA: Diagnosis not present

## 2016-02-04 DIAGNOSIS — J301 Allergic rhinitis due to pollen: Secondary | ICD-10-CM | POA: Diagnosis not present

## 2016-02-05 ENCOUNTER — Ambulatory Visit: Payer: Medicare Other | Admitting: Anesthesiology

## 2016-02-05 ENCOUNTER — Ambulatory Visit
Admission: RE | Admit: 2016-02-05 | Discharge: 2016-02-05 | Disposition: A | Discharge status: Home / Self Care | Payer: Medicare Other | Source: Ambulatory Visit | Attending: Ophthalmology | Admitting: Ophthalmology

## 2016-02-05 ENCOUNTER — Encounter
Admission: RE | Disposition: A | Discharge status: Home / Self Care | Payer: Self-pay | Source: Ambulatory Visit | Attending: Ophthalmology

## 2016-02-05 DIAGNOSIS — Z87891 Personal history of nicotine dependence: Secondary | ICD-10-CM | POA: Insufficient documentation

## 2016-02-05 DIAGNOSIS — I10 Essential (primary) hypertension: Secondary | ICD-10-CM | POA: Diagnosis not present

## 2016-02-05 DIAGNOSIS — H2511 Age-related nuclear cataract, right eye: Secondary | ICD-10-CM | POA: Insufficient documentation

## 2016-02-05 DIAGNOSIS — H2513 Age-related nuclear cataract, bilateral: Secondary | ICD-10-CM | POA: Diagnosis not present

## 2016-02-05 DIAGNOSIS — E119 Type 2 diabetes mellitus without complications: Secondary | ICD-10-CM | POA: Diagnosis not present

## 2016-02-05 DIAGNOSIS — J45909 Unspecified asthma, uncomplicated: Secondary | ICD-10-CM | POA: Insufficient documentation

## 2016-02-05 HISTORY — DX: Other complications of anesthesia, initial encounter: T88.59XA

## 2016-02-05 HISTORY — DX: Migraine, unspecified, not intractable, without status migrainosus: G43.909

## 2016-02-05 HISTORY — PX: CATARACT EXTRACTION W/PHACO: SHX586

## 2016-02-05 HISTORY — DX: Aneurysm of unspecified site: I72.9

## 2016-02-05 HISTORY — DX: Adverse effect of unspecified anesthetic, initial encounter: T41.45XA

## 2016-02-05 LAB — GLUCOSE, CAPILLARY
Glucose-Capillary: 161 mg/dL — ABNORMAL HIGH (ref 65–99)
Glucose-Capillary: 142 mg/dL — ABNORMAL HIGH (ref 65–99)

## 2016-02-05 SURGERY — PHACOEMULSIFICATION, CATARACT, WITH IOL INSERTION
Anesthesia: Monitor Anesthesia Care | Site: Eye | Laterality: Right | Wound class: Clean

## 2016-02-05 MED ORDER — TETRACAINE HCL 0.5 % OP SOLN
1.0000 [drp] | OPHTHALMIC | Status: DC | PRN
  Administered 2016-02-05: 1 [drp] via OPHTHALMIC

## 2016-02-05 MED ORDER — MOXIFLOXACIN HCL 0.5 % OP SOLN
OPHTHALMIC | Status: DC | PRN
  Administered 2016-02-05: 2 [drp] via OPHTHALMIC

## 2016-02-05 MED ORDER — LACTATED RINGERS IV SOLN: INTRAVENOUS | Status: DC

## 2016-02-05 MED ORDER — EPINEPHRINE HCL 1 MG/ML IJ SOLN
INTRAOCULAR | Status: DC | PRN
  Administered 2016-02-05: 71 mL via OPHTHALMIC

## 2016-02-05 MED ORDER — TIMOLOL MALEATE 0.5 % OP SOLN
OPHTHALMIC | Status: DC | PRN
  Administered 2016-02-05: 1 [drp] via OPHTHALMIC

## 2016-02-05 MED ORDER — ACETAMINOPHEN 325 MG PO TABS: 325.0000 mg | ORAL_TABLET | ORAL | Status: DC | PRN

## 2016-02-05 MED ORDER — FENTANYL CITRATE (PF) 100 MCG/2ML IJ SOLN
INTRAMUSCULAR | Status: DC | PRN
  Administered 2016-02-05: 50 ug via INTRAVENOUS

## 2016-02-05 MED ORDER — ARMC OPHTHALMIC DILATING GEL
1.0000 "application " | OPHTHALMIC | Status: DC | PRN
  Administered 2016-02-05 (×2): 1 via OPHTHALMIC

## 2016-02-05 MED ORDER — BRIMONIDINE TARTRATE 0.2 % OP SOLN
OPHTHALMIC | Status: DC | PRN
  Administered 2016-02-05: 1 [drp] via OPHTHALMIC

## 2016-02-05 MED ORDER — MIDAZOLAM HCL 2 MG/2ML IJ SOLN
INTRAMUSCULAR | Status: DC | PRN
  Administered 2016-02-05: 2 mg via INTRAVENOUS

## 2016-02-05 MED ORDER — ACETAMINOPHEN 160 MG/5ML PO SOLN: 325.0000 mg | ORAL | Status: DC | PRN

## 2016-02-05 MED ORDER — POVIDONE-IODINE 5 % OP SOLN
1.0000 | OPHTHALMIC | Status: DC | PRN
Start: 2016-02-05 — End: 2016-02-05
  Administered 2016-02-05: 1 via OPHTHALMIC

## 2016-02-05 MED ORDER — NA HYALUR & NA CHOND-NA HYALUR 0.4-0.35 ML IO KIT
PACK | INTRAOCULAR | Status: DC | PRN
  Administered 2016-02-05: 1 mL via INTRAOCULAR

## 2016-02-05 MED ORDER — LIDOCAINE HCL (PF) 4 % IJ SOLN
INTRAOCULAR | Status: DC | PRN
  Administered 2016-02-05: 1 mL via OPHTHALMIC

## 2016-02-05 SURGICAL SUPPLY — 25 items

## 2016-02-05 NOTE — Op Note (Signed)
LOCATION:  [place]   PREOPERATIVE DIAGNOSIS:    Nuclear sclerotic cataract right eye. H25.11   POSTOPERATIVE DIAGNOSIS:  Nuclear sclerotic cataract right eye.     PROCEDURE:  Phacoemusification with posterior chamber intraocular lens placement of the right eye   LENS:   Implant Name Type Inv. Item Serial No. Manufacturer Lot No. LRB No. Used  LENS IOL DIOP 20.0 - [id number] Intraocular Lens LENS IOL DIOP 20.0 [id number]   Right 1        ULTRASOUND TIME: 14 % of 1 minutes, 41 seconds.  CDE 14.8   SURGEON:  Wyonia Hough, MD   ANESTHESIA:  Topical with tetracaine drops and 2% Xylocaine jelly, augmented with 1% preservative-free intracameral lidocaine.    COMPLICATIONS:  None.   DESCRIPTION OF PROCEDURE:  The patient was identified in the holding room and transported to the operating room and placed in the supine position under the operating microscope.  The right eye was identified as the operative eye and it was prepped and draped in the usual sterile ophthalmic fashion.   A 1 millimeter clear-corneal paracentesis was made at the 12:00 position.  0.5 ml of preservative-free 1% lidocaine was injected into the anterior chamber. The anterior chamber was filled with Viscoat viscoelastic.  A 2.4 millimeter keratome was used to make a near-clear corneal incision at the 9:00 position.  A curvilinear capsulorrhexis was made with a cystotome and capsulorrhexis forceps.  Balanced salt solution was used to hydrodissect and hydrodelineate the nucleus.   Phacoemulsification was then used in stop and chop fashion to remove the lens nucleus and epinucleus.  The remaining cortex was then removed using the irrigation and aspiration handpiece. Provisc was then placed into the capsular bag to distend it for lens placement.  A lens was then injected into the capsular bag.  The remaining viscoelastic was aspirated.   Wounds were hydrated with balanced salt solution.  The anterior  chamber was inflated to a physiologic pressure with balanced salt solution.  No wound leaks were noted. Vigamox 0.2 ml of a 1mg  per ml solution was injected into the anterior chamber for a dose of 0.2 mg of intracameral antibiotic at the completion of the case.   Timolol and Brimonidine drops were applied to the eye.  The patient was taken to the recovery room in stable condition without complications of anesthesia or surgery.   Sharon Nelson 02/05/2016, 7:58 AM

## 2016-02-05 NOTE — Anesthesia Postprocedure Evaluation (Signed)
Anesthesia Post Note  Patient: Sharon Nelson  Procedure(s) Performed: Procedure(s) (LRB): CATARACT EXTRACTION PHACO AND INTRAOCULAR LENS PLACEMENT (IOC) right eye (Right)  Patient location during evaluation: PACU Anesthesia Type: MAC Level of consciousness: awake and alert and oriented Pain management: satisfactory to patient Vital Signs Assessment: post-procedure vital signs reviewed and stable Respiratory status: spontaneous breathing, nonlabored ventilation and respiratory function stable Cardiovascular status: blood pressure returned to baseline and stable Postop Assessment: Adequate PO intake and No signs of nausea or vomiting Anesthetic complications: no    Raliegh Ip

## 2016-02-05 NOTE — H&P (Signed)
  The History and Physical notes are on paper, have been signed, and are to be scanned. The patient remains stable and unchanged from the H&P.   Previous H&P reviewed, patient examined, and there are no changes.  Sharon Nelson 02/05/2016 7:34 AM

## 2016-02-05 NOTE — Anesthesia Preprocedure Evaluation (Signed)
Anesthesia Evaluation  Patient identified by MRN, date of birth, ID band  Reviewed: Allergy & Precautions, H&P , NPO status , Patient's Chart, lab work & pertinent test results  Airway Mallampati: II  TM Distance: >3 FB Neck ROM: full    Dental no notable dental hx.    Pulmonary asthma , former smoker,    Pulmonary exam normal        Cardiovascular hypertension,  Rhythm:regular Rate:Normal     Neuro/Psych    GI/Hepatic   Endo/Other  diabetes  Renal/GU      Musculoskeletal   Abdominal   Peds  Hematology   Anesthesia Other Findings   Reproductive/Obstetrics                             Anesthesia Physical Anesthesia Plan  ASA: II  Anesthesia Plan: MAC   Post-op Pain Management:    Induction:   Airway Management Planned:   Additional Equipment:   Intra-op Plan:   Post-operative Plan:   Informed Consent: I have reviewed the patients History and Physical, chart, labs and discussed the procedure including the risks, benefits and alternatives for the proposed anesthesia with the patient or authorized representative who has indicated his/her understanding and acceptance.     Plan Discussed with: CRNA  Anesthesia Plan Comments:         Anesthesia Quick Evaluation

## 2016-02-05 NOTE — Anesthesia Procedure Notes (Signed)
Procedure Name: MAC Performed by: Euan Wandler Pre-anesthesia Checklist: Patient identified, Emergency Drugs available, Suction available, Timeout performed and Patient being monitored Patient Re-evaluated:Patient Re-evaluated prior to inductionOxygen Delivery Method: Nasal cannula Placement Confirmation: positive ETCO2     

## 2016-02-05 NOTE — Transfer of Care (Signed)
Immediate Anesthesia Transfer of Care Note  Patient: Sharon Nelson  Procedure(s) Performed: Procedure(s) with comments: CATARACT EXTRACTION PHACO AND INTRAOCULAR LENS PLACEMENT (IOC) right eye (Right) - DIABETIC - oral meds  Patient Location: PACU  Anesthesia Type: MAC  Level of Consciousness: awake, alert  and patient cooperative  Airway and Oxygen Therapy: Patient Spontanous Breathing and Patient connected to supplemental oxygen  Post-op Assessment: Post-op Vital signs reviewed, Patient's Cardiovascular Status Stable, Respiratory Function Stable, Patent Airway and No signs of Nausea or vomiting  Post-op Vital Signs: Reviewed and stable  Complications: No apparent anesthesia complications

## 2016-02-06 ENCOUNTER — Encounter: Payer: Self-pay | Admitting: Ophthalmology

## 2016-02-14 DIAGNOSIS — J301 Allergic rhinitis due to pollen: Secondary | ICD-10-CM | POA: Diagnosis not present

## 2016-02-14 DIAGNOSIS — J3089 Other allergic rhinitis: Secondary | ICD-10-CM | POA: Diagnosis not present

## 2016-02-18 DIAGNOSIS — J301 Allergic rhinitis due to pollen: Secondary | ICD-10-CM | POA: Diagnosis not present

## 2016-02-18 DIAGNOSIS — J3089 Other allergic rhinitis: Secondary | ICD-10-CM | POA: Diagnosis not present

## 2016-02-20 DIAGNOSIS — J301 Allergic rhinitis due to pollen: Secondary | ICD-10-CM | POA: Diagnosis not present

## 2016-02-20 DIAGNOSIS — J3089 Other allergic rhinitis: Secondary | ICD-10-CM | POA: Diagnosis not present

## 2016-02-27 DIAGNOSIS — J3089 Other allergic rhinitis: Secondary | ICD-10-CM | POA: Diagnosis not present

## 2016-02-27 DIAGNOSIS — J301 Allergic rhinitis due to pollen: Secondary | ICD-10-CM | POA: Diagnosis not present

## 2016-03-05 DIAGNOSIS — J3089 Other allergic rhinitis: Secondary | ICD-10-CM | POA: Diagnosis not present

## 2016-03-05 DIAGNOSIS — J301 Allergic rhinitis due to pollen: Secondary | ICD-10-CM | POA: Diagnosis not present

## 2016-03-05 DIAGNOSIS — J3081 Allergic rhinitis due to animal (cat) (dog) hair and dander: Secondary | ICD-10-CM | POA: Diagnosis not present

## 2016-03-10 DIAGNOSIS — J3089 Other allergic rhinitis: Secondary | ICD-10-CM | POA: Diagnosis not present

## 2016-03-10 DIAGNOSIS — J301 Allergic rhinitis due to pollen: Secondary | ICD-10-CM | POA: Diagnosis not present

## 2016-03-17 DIAGNOSIS — J301 Allergic rhinitis due to pollen: Secondary | ICD-10-CM | POA: Diagnosis not present

## 2016-03-17 DIAGNOSIS — J3089 Other allergic rhinitis: Secondary | ICD-10-CM | POA: Diagnosis not present

## 2016-03-26 DIAGNOSIS — J3089 Other allergic rhinitis: Secondary | ICD-10-CM | POA: Diagnosis not present

## 2016-03-26 DIAGNOSIS — J301 Allergic rhinitis due to pollen: Secondary | ICD-10-CM | POA: Diagnosis not present

## 2016-03-27 ENCOUNTER — Other Ambulatory Visit: Payer: Self-pay

## 2016-03-31 DIAGNOSIS — J3089 Other allergic rhinitis: Secondary | ICD-10-CM | POA: Diagnosis not present

## 2016-03-31 DIAGNOSIS — J301 Allergic rhinitis due to pollen: Secondary | ICD-10-CM | POA: Diagnosis not present

## 2016-04-07 DIAGNOSIS — J301 Allergic rhinitis due to pollen: Secondary | ICD-10-CM | POA: Diagnosis not present

## 2016-04-07 DIAGNOSIS — J3089 Other allergic rhinitis: Secondary | ICD-10-CM | POA: Diagnosis not present

## 2016-04-16 DIAGNOSIS — J301 Allergic rhinitis due to pollen: Secondary | ICD-10-CM | POA: Diagnosis not present

## 2016-04-16 DIAGNOSIS — J3089 Other allergic rhinitis: Secondary | ICD-10-CM | POA: Diagnosis not present

## 2016-04-17 ENCOUNTER — Other Ambulatory Visit: Payer: Self-pay | Admitting: Family Medicine

## 2016-04-22 ENCOUNTER — Telehealth: Payer: Self-pay | Admitting: Family Medicine

## 2016-04-22 DIAGNOSIS — E119 Type 2 diabetes mellitus without complications: Secondary | ICD-10-CM | POA: Diagnosis not present

## 2016-04-22 DIAGNOSIS — E782 Mixed hyperlipidemia: Secondary | ICD-10-CM | POA: Diagnosis not present

## 2016-04-22 DIAGNOSIS — I10 Essential (primary) hypertension: Secondary | ICD-10-CM | POA: Diagnosis not present

## 2016-04-22 DIAGNOSIS — Z0000 Encounter for general adult medical examination without abnormal findings: Secondary | ICD-10-CM | POA: Diagnosis not present

## 2016-04-22 DIAGNOSIS — E1169 Type 2 diabetes mellitus with other specified complication: Secondary | ICD-10-CM

## 2016-04-22 NOTE — Telephone Encounter (Signed)
Butch Penny from Dr Talmadge Coventry called requesting referral for pt to see Dr Gabriel Carina for diabetes.Pt has agreed to referral

## 2016-04-22 NOTE — Telephone Encounter (Signed)
Please schedule referral to Dr. Gabriel Carina? Thanks!

## 2016-04-22 NOTE — Telephone Encounter (Signed)
Patient hasn't been here for diabetes follow up since last year. OK to refer to endocrinology or schedule diabetes follow up here, whichever she prefers.

## 2016-04-22 NOTE — Telephone Encounter (Signed)
Please advise. Patients last Diabetes follow up here was 04/05/2015. A1C at that visit was 7.0. No changes were made. Patient was to continue current mediations

## 2016-04-23 DIAGNOSIS — J3089 Other allergic rhinitis: Secondary | ICD-10-CM | POA: Diagnosis not present

## 2016-04-23 DIAGNOSIS — J301 Allergic rhinitis due to pollen: Secondary | ICD-10-CM | POA: Diagnosis not present

## 2016-04-28 DIAGNOSIS — J3089 Other allergic rhinitis: Secondary | ICD-10-CM | POA: Diagnosis not present

## 2016-04-28 DIAGNOSIS — J301 Allergic rhinitis due to pollen: Secondary | ICD-10-CM | POA: Diagnosis not present

## 2016-05-01 ENCOUNTER — Ambulatory Visit (INDEPENDENT_AMBULATORY_CARE_PROVIDER_SITE_OTHER): Payer: Medicare Other

## 2016-05-01 DIAGNOSIS — Z23 Encounter for immunization: Secondary | ICD-10-CM | POA: Diagnosis not present

## 2016-05-05 DIAGNOSIS — J3089 Other allergic rhinitis: Secondary | ICD-10-CM | POA: Diagnosis not present

## 2016-05-05 DIAGNOSIS — J301 Allergic rhinitis due to pollen: Secondary | ICD-10-CM | POA: Diagnosis not present

## 2016-05-07 ENCOUNTER — Ambulatory Visit: Payer: Medicare Other

## 2016-05-07 DIAGNOSIS — H209 Unspecified iridocyclitis: Secondary | ICD-10-CM | POA: Diagnosis not present

## 2016-05-08 ENCOUNTER — Other Ambulatory Visit: Payer: Self-pay | Admitting: Family Medicine

## 2016-05-08 DIAGNOSIS — Z1231 Encounter for screening mammogram for malignant neoplasm of breast: Secondary | ICD-10-CM

## 2016-05-14 DIAGNOSIS — J301 Allergic rhinitis due to pollen: Secondary | ICD-10-CM | POA: Diagnosis not present

## 2016-05-14 DIAGNOSIS — J3089 Other allergic rhinitis: Secondary | ICD-10-CM | POA: Diagnosis not present

## 2016-05-19 DIAGNOSIS — J301 Allergic rhinitis due to pollen: Secondary | ICD-10-CM | POA: Diagnosis not present

## 2016-05-19 DIAGNOSIS — J3089 Other allergic rhinitis: Secondary | ICD-10-CM | POA: Diagnosis not present

## 2016-05-20 ENCOUNTER — Ambulatory Visit
Admission: RE | Admit: 2016-05-20 | Discharge: 2016-05-20 | Disposition: A | Discharge status: Home / Self Care | Payer: Medicare Other | Source: Ambulatory Visit | Attending: Family Medicine | Admitting: Family Medicine

## 2016-05-20 DIAGNOSIS — Z1231 Encounter for screening mammogram for malignant neoplasm of breast: Secondary | ICD-10-CM | POA: Insufficient documentation

## 2016-05-23 ENCOUNTER — Other Ambulatory Visit: Payer: Self-pay | Admitting: Family Medicine

## 2016-05-26 DIAGNOSIS — J301 Allergic rhinitis due to pollen: Secondary | ICD-10-CM | POA: Diagnosis not present

## 2016-05-26 DIAGNOSIS — J3089 Other allergic rhinitis: Secondary | ICD-10-CM | POA: Diagnosis not present

## 2016-06-02 DIAGNOSIS — J3089 Other allergic rhinitis: Secondary | ICD-10-CM | POA: Diagnosis not present

## 2016-06-02 DIAGNOSIS — J301 Allergic rhinitis due to pollen: Secondary | ICD-10-CM | POA: Diagnosis not present

## 2016-06-09 DIAGNOSIS — J301 Allergic rhinitis due to pollen: Secondary | ICD-10-CM | POA: Diagnosis not present

## 2016-06-09 DIAGNOSIS — J3081 Allergic rhinitis due to animal (cat) (dog) hair and dander: Secondary | ICD-10-CM | POA: Diagnosis not present

## 2016-06-09 DIAGNOSIS — J3089 Other allergic rhinitis: Secondary | ICD-10-CM | POA: Diagnosis not present

## 2016-06-16 DIAGNOSIS — J3089 Other allergic rhinitis: Secondary | ICD-10-CM | POA: Diagnosis not present

## 2016-06-16 DIAGNOSIS — J301 Allergic rhinitis due to pollen: Secondary | ICD-10-CM | POA: Diagnosis not present

## 2016-06-26 DIAGNOSIS — E119 Type 2 diabetes mellitus without complications: Secondary | ICD-10-CM | POA: Diagnosis not present

## 2016-06-26 DIAGNOSIS — E1140 Type 2 diabetes mellitus with diabetic neuropathy, unspecified: Secondary | ICD-10-CM | POA: Diagnosis not present

## 2016-07-02 DIAGNOSIS — J301 Allergic rhinitis due to pollen: Secondary | ICD-10-CM | POA: Diagnosis not present

## 2016-07-02 DIAGNOSIS — J4530 Mild persistent asthma, uncomplicated: Secondary | ICD-10-CM | POA: Diagnosis not present

## 2016-07-02 DIAGNOSIS — J3089 Other allergic rhinitis: Secondary | ICD-10-CM | POA: Diagnosis not present

## 2016-07-02 DIAGNOSIS — L209 Atopic dermatitis, unspecified: Secondary | ICD-10-CM | POA: Diagnosis not present

## 2016-07-07 DIAGNOSIS — J301 Allergic rhinitis due to pollen: Secondary | ICD-10-CM | POA: Diagnosis not present

## 2016-07-07 DIAGNOSIS — J3089 Other allergic rhinitis: Secondary | ICD-10-CM | POA: Diagnosis not present

## 2016-07-10 ENCOUNTER — Ambulatory Visit (INDEPENDENT_AMBULATORY_CARE_PROVIDER_SITE_OTHER): Payer: Medicare Other

## 2016-07-10 VITALS — BP 144/70 | HR 72 | Temp 98.4°F | Ht 59.0 in | Wt 163.0 lb

## 2016-07-10 DIAGNOSIS — Z0000 Encounter for general adult medical examination without abnormal findings: Secondary | ICD-10-CM

## 2016-07-10 NOTE — Patient Instructions (Signed)
[name] , Thank you for taking time to come for your Medicare Wellness Visit. I appreciate your ongoing commitment to your health goals. Please review the following plan we discussed and let me know if I can assist you in the future.   These are the goals we discussed: Goals    . Increase water intake          Starting 07/10/16, I will increase my water intake to 4 glasses a day.       This is a list of the screening recommended for you and due dates:  Health Maintenance  Topic Date Due  .  Hepatitis C: One time screening is recommended by Center for Disease Control  (CDC) for  adults born from [birth year] through 1967.   Nov 28, 1950  . Complete foot exam   01/08/1959  . Urine Protein Check  01/08/1959  . Colon Cancer Screening  01/08/1999  . Shingles Vaccine  01/07/2009  . Hemoglobin A1C  10/03/2015  . Pneumonia vaccines (2 of 2 - PPSV23) 05/10/2016  . Eye exam for diabetics  01/02/2017  . Mammogram  05/20/2018  . Tetanus Vaccine  05/10/2021  . Flu Shot  Completed  . DEXA scan (bone density measurement)  Completed   Preventive Care for Adults  A healthy lifestyle and preventive care can promote health and wellness. Preventive health guidelines for adults include the following key practices.  . A routine yearly physical is a good way to check with your health care provider about your health and preventive screening. It is a chance to share any concerns and updates on your health and to receive a thorough exam.  . Visit your dentist for a routine exam and preventive care every 6 months. Brush your teeth twice a day and floss once a day. Good oral hygiene prevents tooth decay and gum disease.  . The frequency of eye exams is based on your age, health, family medical history, use  of contact lenses, and other factors. Follow your health care provider's ecommendations for frequency of eye exams.  . Eat a healthy diet. Foods like vegetables, fruits, whole grains, low-fat dairy products,  and lean protein foods contain the nutrients you need without too many calories. Decrease your intake of foods high in solid fats, added sugars, and salt. Eat the right amount of calories for you. Get information about a proper diet from your health care provider, if necessary.  . Regular physical exercise is one of the most important things you can do for your health. Most adults should get at least 150 minutes of moderate-intensity exercise (any activity that increases your heart rate and causes you to sweat) each week. In addition, most adults need muscle-strengthening exercises on 2 or more days a week.  Silver Sneakers may be a benefit available to you. To determine eligibility, you may visit the website: www.silversneakers.com or contact program at (931)604-4175 Mon-Fri between 8AM-8PM.   . Maintain a healthy weight. The body mass index (BMI) is a screening tool to identify possible weight problems. It provides an estimate of body fat based on height and weight. Your health care provider can find your BMI and can help you achieve or maintain a healthy weight.   For adults 20 years and older: ? A BMI below 18.5 is considered underweight. ? A BMI of 18.5 to 24.9 is normal. ? A BMI of 25 to 29.9 is considered overweight. ? A BMI of 30 and above is considered obese.   . Maintain  normal blood lipids and cholesterol levels by exercising and minimizing your intake of saturated fat. Eat a balanced diet with plenty of fruit and vegetables. Blood tests for lipids and cholesterol should begin at age 86 and be repeated every 5 years. If your lipid or cholesterol levels are high, you are over 50, or you are at high risk for heart disease, you may need your cholesterol levels checked more frequently. Ongoing high lipid and cholesterol levels should be treated with medicines if diet and exercise are not working.  . If you smoke, find out from your health care provider how to quit. If you do not use tobacco,  please do not start.  . If you choose to drink alcohol, please do not consume more than 2 drinks per day. One drink is considered to be 12 ounces (355 mL) of beer, 5 ounces (148 mL) of wine, or 1.5 ounces (44 mL) of liquor.  . If you are 22-52 years old, ask your health care provider if you should take aspirin to prevent strokes.  . Use sunscreen. Apply sunscreen liberally and repeatedly throughout the day. You should seek shade when your shadow is shorter than you. Protect yourself by wearing long sleeves, pants, a wide-brimmed hat, and sunglasses year round, whenever you are outdoors.  . Once a month, do a whole body skin exam, using a mirror to look at the skin on your back. Tell your health care provider of new moles, moles that have irregular borders, moles that are larger than a pencil eraser, or moles that have changed in shape or color.
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
## 2016-07-15 ENCOUNTER — Ambulatory Visit: Payer: Medicare Other | Admitting: *Deleted

## 2016-07-21 ENCOUNTER — Ambulatory Visit: Payer: Medicare Other | Admitting: Dietician

## 2016-07-23 DIAGNOSIS — J301 Allergic rhinitis due to pollen: Secondary | ICD-10-CM | POA: Diagnosis not present

## 2016-07-23 DIAGNOSIS — J3089 Other allergic rhinitis: Secondary | ICD-10-CM | POA: Diagnosis not present

## 2016-07-28 DIAGNOSIS — J301 Allergic rhinitis due to pollen: Secondary | ICD-10-CM | POA: Diagnosis not present

## 2016-07-28 DIAGNOSIS — J3089 Other allergic rhinitis: Secondary | ICD-10-CM | POA: Diagnosis not present

## 2016-07-31 ENCOUNTER — Encounter: Payer: Self-pay | Admitting: *Deleted

## 2016-07-31 ENCOUNTER — Encounter: Payer: Medicare Other | Attending: Internal Medicine | Admitting: *Deleted

## 2016-07-31 VITALS — BP 140/78 | Ht 60.0 in | Wt 160.3 lb

## 2016-07-31 DIAGNOSIS — E119 Type 2 diabetes mellitus without complications: Secondary | ICD-10-CM | POA: Diagnosis not present

## 2016-07-31 DIAGNOSIS — Z713 Dietary counseling and surveillance: Secondary | ICD-10-CM | POA: Insufficient documentation

## 2016-07-31 NOTE — Patient Instructions (Addendum)
Check blood sugars 2 x day before breakfast and 2 hrs after supper every day   Exercise: Walk  for    15  minutes  3  days a week and gradually increase as tolerated  Eat 3 meals day,  1 snacks a day Space meals 4-6 hours apart Don't skip meals Include 1 serving of protein with each meal  Complete 3 Day Food Record and bring to next appt  Bring blood sugar records to the next appointment  Return for appointment on: Friday August 28, 2016 at 9:00 am with [facility] (dietitian)

## 2016-07-31 NOTE — Progress Notes (Signed)
Diabetes Self-Management Education  Visit Type: First/Initial  Appt. Start Time: 0930 Appt. End Time: [id number]  07/31/2016  [name], identified by name and date of birth, is a 66 y.o. female with a diagnosis of Diabetes: Type 2.   ASSESSMENT  Blood pressure 140/78, height 5' (1.524 m), weight 160 lb 4.8 oz (72.7 kg). Body mass index is 31.31 kg/m.      Diabetes Self-Management Education - 07/31/16 1313      Visit Information   Visit Type First/Initial     Initial Visit   Diabetes Type Type 2   Are you currently following a meal plan? No   Are you taking your medications as prescribed? Yes   Date Diagnosed 2000     Health Coping   How would you rate your overall health? Good     Psychosocial Assessment   Patient Belief/Attitude about Diabetes Other (comment)  "having limitations"   Self-care barriers Other (comment)  caring for husband who is disabled   Self-management support [place] office;Family   Patient Concerns Nutrition/Meal planning;Glycemic Control;Weight Control;Healthy Lifestyle;Medication   Special Needs None   Preferred Learning Style Auditory;Hands on   Learning Readiness Ready   How often do you need to have someone help you when you read instructions, pamphlets, or other written materials from your doctor or pharmacy? 1 - Never   What is the last grade level you completed in school? 2+ college     Pre-Education Assessment   Patient understands the diabetes disease and treatment process. Needs Review   Patient understands incorporating nutritional management into lifestyle. Needs Review   Patient undertands incorporating physical activity into lifestyle. Needs Instruction   Patient understands using medications safely. Needs Instruction   Patient understands monitoring blood glucose, interpreting and using results Needs Review   Patient understands prevention, detection, and treatment of acute complications. Needs Instruction   Patient understands  prevention, detection, and treatment of chronic complications. Needs Review   Patient understands how to develop strategies to address psychosocial issues. Needs Instruction   Patient understands how to develop strategies to promote health/change behavior. Needs Instruction     Complications   Last HgB A1C per patient/outside source 8.1 %  06/26/16   How often do you check your blood sugar? 1-2 times/day   Fasting Blood glucose range (mg/dL) 70-129;130-179  Pt reports FBG's were 115-120 mg/dL prior to Christmas and now as high as 167 mg/dL.    Have you had a dilated eye exam in the past 12 months? Yes   Have you had a dental exam in the past 12 months? Yes   Are you checking your feet? Yes   How many days per week are you checking your feet? 7     Dietary Intake   Breakfast skips    Lunch 3-4 pm she has broccoli, chicken, fast food sandwich   Snack (afternoon) peanut butter crackers, popcorn   Dinner rice, jumbalya, sweet potatoes   Beverage(s) water, coffee     Exercise   Exercise Type ADL's     Patient Education   Previous Diabetes Education Yes (please comment)  came to classes in past   Disease state  Explored patient's options for treatment of their diabetes   Nutrition management  Role of diet in the treatment of diabetes and the relationship between the three main macronutrients and blood glucose level;Food label reading, portion sizes and measuring food.;Reviewed blood glucose goals for pre and post meals and how to evaluate the patients'  food intake on their blood glucose level.   Physical activity and exercise  Role of exercise on diabetes management, blood pressure control and cardiac health.   Medications Reviewed patients medication for diabetes, action, purpose, timing of dose and side effects.   Monitoring Purpose and frequency of SMBG.;Taught/discussed recording of test results and interpretation of SMBG.;Identified appropriate SMBG and/or A1C goals.   Chronic  complications Relationship between chronic complications and blood glucose control   Psychosocial adjustment Role of stress on diabetes;Identified and addressed patients feelings and concerns about diabetes     Individualized Goals (developed by patient)   Reducing Risk Improve blood sugars Decrease medications Lose weight Lead a healthier lifestyle     Outcomes   Expected Outcomes Demonstrated interest in learning. Expect positive outcomes   Future DMSE 4-6 wks      Individualized Plan for Diabetes Self-Management Training:   Learning Objective:  Patient will have a greater understanding of diabetes self-management. Patient education plan is to attend individual and/or group sessions per assessed needs and concerns.   Plan:   Patient Instructions  Check blood sugars 2 x day before breakfast and 2 hrs after supper every day Exercise: Walk  for    15  minutes  3  days a week and gradually increase as tolerated Eat 3 meals day,  1 snacks a day Space meals 4-6 hours apart Don't skip meals Include 1 serving of protein with each meal Complete 3 Day Food Record and bring to next appt Bring blood sugar records to the next appointment Return for appointment on: Friday August 28, 2016 at 9:00 am with [facility] (dietitian)   Expected Outcomes:  Demonstrated interest in learning. Expect positive outcomes  Education material provided:  General Meal Planning Guidelines Simple Meal Plan 3 Day Food Record  If problems or questions, patient to contact team via:  Johny Drilling, RN, [place], CDE [phone]  Future DSME appointment: 4-6 wks  Friday August 28, 2016 with dietitian

## 2016-08-06 DIAGNOSIS — J301 Allergic rhinitis due to pollen: Secondary | ICD-10-CM | POA: Diagnosis not present

## 2016-08-06 DIAGNOSIS — J3089 Other allergic rhinitis: Secondary | ICD-10-CM | POA: Diagnosis not present

## 2016-08-08 ENCOUNTER — Other Ambulatory Visit: Payer: Self-pay | Admitting: Family Medicine

## 2016-08-11 DIAGNOSIS — J301 Allergic rhinitis due to pollen: Secondary | ICD-10-CM | POA: Diagnosis not present

## 2016-08-11 DIAGNOSIS — J3089 Other allergic rhinitis: Secondary | ICD-10-CM | POA: Diagnosis not present

## 2016-08-18 ENCOUNTER — Ambulatory Visit: Payer: Medicare Other | Admitting: Family Medicine

## 2016-08-18 DIAGNOSIS — J301 Allergic rhinitis due to pollen: Secondary | ICD-10-CM | POA: Diagnosis not present

## 2016-08-18 DIAGNOSIS — J3089 Other allergic rhinitis: Secondary | ICD-10-CM | POA: Diagnosis not present

## 2016-08-18 DIAGNOSIS — J3081 Allergic rhinitis due to animal (cat) (dog) hair and dander: Secondary | ICD-10-CM | POA: Diagnosis not present

## 2016-08-27 DIAGNOSIS — J301 Allergic rhinitis due to pollen: Secondary | ICD-10-CM | POA: Diagnosis not present

## 2016-08-27 DIAGNOSIS — J3089 Other allergic rhinitis: Secondary | ICD-10-CM | POA: Diagnosis not present

## 2016-08-28 ENCOUNTER — Encounter: Payer: Medicare Other | Attending: Internal Medicine | Admitting: Dietician

## 2016-08-28 ENCOUNTER — Encounter: Payer: Self-pay | Admitting: Dietician

## 2016-08-28 VITALS — BP 138/70 | Ht 59.0 in | Wt 159.9 lb

## 2016-08-28 DIAGNOSIS — Z713 Dietary counseling and surveillance: Secondary | ICD-10-CM | POA: Insufficient documentation

## 2016-08-28 DIAGNOSIS — E119 Type 2 diabetes mellitus without complications: Secondary | ICD-10-CM | POA: Diagnosis not present

## 2016-08-28 NOTE — Progress Notes (Signed)
Diabetes Self-Management Education  Visit Type:  Follow-up  Appt. Start Time: 0915 Appt. End Time: 1030  08/28/2016  [name], identified by name and date of birth, is a 66 y.o. female with a diagnosis of Diabetes:  .   ASSESSMENT  Blood pressure 138/70, height 4\' 11"  (1.499 m), weight 159 lb 14.4 oz (72.5 kg). Body mass index is 32.3 kg/m.       Diabetes Self-Management Education - [id number]      Complications   How often do you check your blood sugar? 3-4 times / week   Fasting Blood glucose range (mg/dL) 130-179   Postprandial Blood glucose range (mg/dL) 130-179   Have you had a dilated eye exam in the past 12 months? Yes   Have you had a dental exam in the past 12 months? Yes   Are you checking your feet? Yes   How many days per week are you checking your feet? 7     Dietary Intake   Breakfast 2-3  meals and 0-1 snack daily     Exercise   Exercise Type ADL's     Patient Education   Nutrition management  Role of diet in the treatment of diabetes and the relationship between the three main macronutrients and blood glucose level;Reviewed blood glucose goals for pre and post meals and how to evaluate the patients' food intake on their blood glucose level.;Meal timing in regards to the patients' current diabetes medication.;Meal options for control of blood glucose level and chronic complications.   Physical activity and exercise  Role of exercise on diabetes management, blood pressure control and cardiac health.   Psychosocial adjustment Role of stress on diabetes   Personal strategies to promote health Helped patient develop diabetes management plan for (enter comment);Other (comment)  effects of rest, hydration on BG control     Post-Education Assessment   Patient understands the diabetes disease and treatment process. Demonstrates understanding / competency   Patient understands incorporating nutritional management into lifestyle. Demonstrates understanding /  competency   Patient undertands incorporating physical activity into lifestyle. Demonstrates understanding / competency   Patient understands using medications safely. Demonstrates understanding / competency   Patient understands monitoring blood glucose, interpreting and using results Demonstrates understanding / competency   Patient understands prevention, detection, and treatment of acute complications. Demonstrates understanding / competency   Patient understands prevention, detection, and treatment of chronic complications. Demonstrates understanding / competency   Patient understands how to develop strategies to address psychosocial issues. Needs Review   Patient understands how to develop strategies to promote health/change behavior. Demonstrates understanding / competency     Outcomes   Program Status Completed      Learning Objective:  Patient will have a greater understanding of diabetes self-management.  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 She is also likely inadequate in fluid intake. Discussed options for simple, quick and easy meals and snacks that she can incorporate throughout the day. Regular exercise is difficult but she walks on her property with the after-school children.  Plan:   Patient Instructions   Try some frozen protein blends (Bird[employer]), cottage cheese, peanut butter with healthy carb such as whole grain English muffin, whole grain crackers, fruit.   Try to have something simple to eat every 3-5 hours during the day.   Drink more fluids during the day, make sure they are sugar free. Aim  for about 64oz total fluid intake daily.   Try refrigerator oatmeal -- 1/2 cup oats, 3/4-1 cup water or milk, mix together and keep in fridge overnight. In the morning you can add some fruit, nuts and other seasoning and microwave to warm it up and it's ready to eat.        Expected Outcomes:  Demonstrated interest in learning. Expect positive outcomes  Education material provided: Quick and Healthy Meal Ideas, Smart Snacking  If problems or questions, patient to contact team via:  Phone

## 2016-08-28 NOTE — Patient Instructions (Addendum)
   Try some frozen protein blends (Bird's Eye), cottage cheese, peanut butter with healthy carb such as whole grain English muffin, whole grain crackers, fruit.   Try to have something simple to eat every 3-5 hours during the day.   Drink more fluids during the day, make sure they are sugar free. Aim for about 64oz total fluid intake daily.   Try refrigerator oatmeal -- 1/2 cup oats, 3/4-1 cup water or milk, mix together and keep in fridge overnight. In the morning you can add some fruit, nuts and other seasoning and microwave to warm it up and it's ready to eat.

## 2016-09-01 DIAGNOSIS — J301 Allergic rhinitis due to pollen: Secondary | ICD-10-CM | POA: Diagnosis not present

## 2016-09-01 DIAGNOSIS — J3089 Other allergic rhinitis: Secondary | ICD-10-CM | POA: Diagnosis not present

## 2016-09-11 DIAGNOSIS — J301 Allergic rhinitis due to pollen: Secondary | ICD-10-CM | POA: Diagnosis not present

## 2016-09-11 DIAGNOSIS — J3089 Other allergic rhinitis: Secondary | ICD-10-CM | POA: Diagnosis not present

## 2016-09-15 DIAGNOSIS — J301 Allergic rhinitis due to pollen: Secondary | ICD-10-CM | POA: Diagnosis not present

## 2016-09-15 DIAGNOSIS — J3089 Other allergic rhinitis: Secondary | ICD-10-CM | POA: Diagnosis not present

## 2016-09-22 DIAGNOSIS — J3089 Other allergic rhinitis: Secondary | ICD-10-CM | POA: Diagnosis not present

## 2016-09-22 DIAGNOSIS — J301 Allergic rhinitis due to pollen: Secondary | ICD-10-CM | POA: Diagnosis not present

## 2016-09-25 DIAGNOSIS — E1140 Type 2 diabetes mellitus with diabetic neuropathy, unspecified: Secondary | ICD-10-CM | POA: Diagnosis not present

## 2016-09-25 LAB — HEMOGLOBIN A1C: Hemoglobin A1C: 8

## 2016-10-01 DIAGNOSIS — J3089 Other allergic rhinitis: Secondary | ICD-10-CM | POA: Diagnosis not present

## 2016-10-01 DIAGNOSIS — J3081 Allergic rhinitis due to animal (cat) (dog) hair and dander: Secondary | ICD-10-CM | POA: Diagnosis not present

## 2016-10-01 DIAGNOSIS — J301 Allergic rhinitis due to pollen: Secondary | ICD-10-CM | POA: Diagnosis not present

## 2016-10-08 DIAGNOSIS — J3089 Other allergic rhinitis: Secondary | ICD-10-CM | POA: Diagnosis not present

## 2016-10-08 DIAGNOSIS — J301 Allergic rhinitis due to pollen: Secondary | ICD-10-CM | POA: Diagnosis not present

## 2016-10-15 DIAGNOSIS — J301 Allergic rhinitis due to pollen: Secondary | ICD-10-CM | POA: Diagnosis not present

## 2016-10-15 DIAGNOSIS — J3081 Allergic rhinitis due to animal (cat) (dog) hair and dander: Secondary | ICD-10-CM | POA: Diagnosis not present

## 2016-10-15 DIAGNOSIS — J3089 Other allergic rhinitis: Secondary | ICD-10-CM | POA: Diagnosis not present

## 2016-10-27 DIAGNOSIS — J3089 Other allergic rhinitis: Secondary | ICD-10-CM | POA: Diagnosis not present

## 2016-10-27 DIAGNOSIS — J301 Allergic rhinitis due to pollen: Secondary | ICD-10-CM | POA: Diagnosis not present

## 2016-11-03 DIAGNOSIS — J301 Allergic rhinitis due to pollen: Secondary | ICD-10-CM | POA: Diagnosis not present

## 2016-11-03 DIAGNOSIS — J3089 Other allergic rhinitis: Secondary | ICD-10-CM | POA: Diagnosis not present

## 2016-11-10 DIAGNOSIS — J301 Allergic rhinitis due to pollen: Secondary | ICD-10-CM | POA: Diagnosis not present

## 2016-11-10 DIAGNOSIS — J3089 Other allergic rhinitis: Secondary | ICD-10-CM | POA: Diagnosis not present

## 2016-11-17 DIAGNOSIS — J3089 Other allergic rhinitis: Secondary | ICD-10-CM | POA: Diagnosis not present

## 2016-11-17 DIAGNOSIS — J301 Allergic rhinitis due to pollen: Secondary | ICD-10-CM | POA: Diagnosis not present

## 2016-11-24 DIAGNOSIS — J3089 Other allergic rhinitis: Secondary | ICD-10-CM | POA: Diagnosis not present

## 2016-11-24 DIAGNOSIS — J301 Allergic rhinitis due to pollen: Secondary | ICD-10-CM | POA: Diagnosis not present

## 2016-12-15 DIAGNOSIS — J301 Allergic rhinitis due to pollen: Secondary | ICD-10-CM | POA: Diagnosis not present

## 2016-12-15 DIAGNOSIS — J3089 Other allergic rhinitis: Secondary | ICD-10-CM | POA: Diagnosis not present

## 2016-12-22 DIAGNOSIS — J301 Allergic rhinitis due to pollen: Secondary | ICD-10-CM | POA: Diagnosis not present

## 2016-12-22 DIAGNOSIS — J3089 Other allergic rhinitis: Secondary | ICD-10-CM | POA: Diagnosis not present

## 2016-12-29 DIAGNOSIS — J301 Allergic rhinitis due to pollen: Secondary | ICD-10-CM | POA: Diagnosis not present

## 2016-12-29 DIAGNOSIS — J3089 Other allergic rhinitis: Secondary | ICD-10-CM | POA: Diagnosis not present

## 2017-01-04 DIAGNOSIS — J301 Allergic rhinitis due to pollen: Secondary | ICD-10-CM | POA: Diagnosis not present

## 2017-01-04 DIAGNOSIS — J3089 Other allergic rhinitis: Secondary | ICD-10-CM | POA: Diagnosis not present

## 2017-01-05 DIAGNOSIS — J301 Allergic rhinitis due to pollen: Secondary | ICD-10-CM | POA: Diagnosis not present

## 2017-01-05 DIAGNOSIS — J3089 Other allergic rhinitis: Secondary | ICD-10-CM | POA: Diagnosis not present

## 2017-01-26 DIAGNOSIS — J301 Allergic rhinitis due to pollen: Secondary | ICD-10-CM | POA: Diagnosis not present

## 2017-01-26 DIAGNOSIS — J3089 Other allergic rhinitis: Secondary | ICD-10-CM | POA: Diagnosis not present

## 2017-02-11 DIAGNOSIS — J3089 Other allergic rhinitis: Secondary | ICD-10-CM | POA: Diagnosis not present

## 2017-02-11 DIAGNOSIS — J301 Allergic rhinitis due to pollen: Secondary | ICD-10-CM | POA: Diagnosis not present

## 2017-02-16 DIAGNOSIS — J3089 Other allergic rhinitis: Secondary | ICD-10-CM | POA: Diagnosis not present

## 2017-02-16 DIAGNOSIS — J301 Allergic rhinitis due to pollen: Secondary | ICD-10-CM | POA: Diagnosis not present

## 2017-02-19 ENCOUNTER — Encounter: Payer: Self-pay | Admitting: Family Medicine

## 2017-02-19 ENCOUNTER — Other Ambulatory Visit: Payer: Self-pay | Admitting: Family Medicine

## 2017-02-23 DIAGNOSIS — J301 Allergic rhinitis due to pollen: Secondary | ICD-10-CM | POA: Diagnosis not present

## 2017-02-23 DIAGNOSIS — J3089 Other allergic rhinitis: Secondary | ICD-10-CM | POA: Diagnosis not present

## 2017-03-02 DIAGNOSIS — J301 Allergic rhinitis due to pollen: Secondary | ICD-10-CM | POA: Diagnosis not present

## 2017-03-02 DIAGNOSIS — J3089 Other allergic rhinitis: Secondary | ICD-10-CM | POA: Diagnosis not present

## 2017-03-09 DIAGNOSIS — J301 Allergic rhinitis due to pollen: Secondary | ICD-10-CM | POA: Diagnosis not present

## 2017-03-09 DIAGNOSIS — J3089 Other allergic rhinitis: Secondary | ICD-10-CM | POA: Diagnosis not present

## 2017-03-25 DIAGNOSIS — J3089 Other allergic rhinitis: Secondary | ICD-10-CM | POA: Diagnosis not present

## 2017-03-25 DIAGNOSIS — J301 Allergic rhinitis due to pollen: Secondary | ICD-10-CM | POA: Diagnosis not present

## 2017-03-29 ENCOUNTER — Other Ambulatory Visit: Payer: Self-pay | Admitting: Family Medicine

## 2017-04-06 ENCOUNTER — Encounter: Payer: Self-pay | Admitting: *Deleted

## 2017-04-06 DIAGNOSIS — J3089 Other allergic rhinitis: Secondary | ICD-10-CM | POA: Diagnosis not present

## 2017-04-06 DIAGNOSIS — E119 Type 2 diabetes mellitus without complications: Secondary | ICD-10-CM | POA: Diagnosis not present

## 2017-04-06 DIAGNOSIS — J301 Allergic rhinitis due to pollen: Secondary | ICD-10-CM | POA: Diagnosis not present

## 2017-04-06 LAB — HM DIABETES EYE EXAM

## 2017-04-14 ENCOUNTER — Encounter: Payer: Self-pay | Admitting: Family Medicine

## 2017-04-15 DIAGNOSIS — J3089 Other allergic rhinitis: Secondary | ICD-10-CM | POA: Diagnosis not present

## 2017-04-15 DIAGNOSIS — J301 Allergic rhinitis due to pollen: Secondary | ICD-10-CM | POA: Diagnosis not present

## 2017-04-19 ENCOUNTER — Ambulatory Visit (INDEPENDENT_AMBULATORY_CARE_PROVIDER_SITE_OTHER): Payer: Medicare Other | Admitting: Family Medicine

## 2017-04-19 ENCOUNTER — Telehealth: Payer: Self-pay | Admitting: Family Medicine

## 2017-04-19 ENCOUNTER — Encounter: Payer: Self-pay | Admitting: Family Medicine

## 2017-04-19 VITALS — BP 126/72 | HR 78 | Temp 97.9°F | Resp 24 | Wt 161.0 lb

## 2017-04-19 DIAGNOSIS — I770 Arteriovenous fistula, acquired: Secondary | ICD-10-CM

## 2017-04-19 DIAGNOSIS — Z789 Other specified health status: Secondary | ICD-10-CM | POA: Insufficient documentation

## 2017-04-19 DIAGNOSIS — I671 Cerebral aneurysm, nonruptured: Secondary | ICD-10-CM

## 2017-04-19 DIAGNOSIS — R51 Headache: Secondary | ICD-10-CM

## 2017-04-19 DIAGNOSIS — Z23 Encounter for immunization: Secondary | ICD-10-CM | POA: Diagnosis not present

## 2017-04-19 DIAGNOSIS — J0110 Acute frontal sinusitis, unspecified: Secondary | ICD-10-CM | POA: Diagnosis not present

## 2017-04-19 DIAGNOSIS — R519 Headache, unspecified: Secondary | ICD-10-CM

## 2017-04-19 MED ORDER — DOXYCYCLINE HYCLATE 100 MG PO TABS: 100.0000 mg | ORAL_TABLET | Freq: Two times a day (BID) | ORAL | 0 refills | Status: AC

## 2017-04-19 NOTE — Progress Notes (Signed)
Patient: Sharon Nelson Female    DOB: 03-02-1951   66 y.o.   MRN: [id number] Visit Date: 04/19/2017  Today's Provider: [name], MD   Chief Complaint  Patient presents with  . Sinusitis   Subjective:    Sinusitis  This is a new problem. The current episode started 1 to 4 weeks ago (2 weeks). There has been no fever (has felt feverish). Associated symptoms include congestion, coughing, headaches, a hoarse voice and sinus pressure. Past treatments include acetaminophen. The treatment provided mild relief.   Patient also mentions that she has facial tenderness on the right side of her face. Symptoms seem to be constant.   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 She is concerned about recurrence of this lesion and that she was advised by her neurologist to have it checked periodically, especially if she has recurrent headaches.     Allergies  Allergen Reactions  . Lisinopril Anaphylaxis and Swelling    WAS ON LIFE SUPPORT   . Iodinated Diagnostic Agents Rash    IV dye, red dye Other Reaction: feeling of heat  . Cinnamon Swelling    Other reaction(s): Swollen lips  . Peanut Oil     Other reaction(s): Swelling  . Penicillins Swelling    Other reaction(s): Swelling  . Sulfa Antibiotics Swelling    Other reaction(s): Swelling  . Verapamil Hcl Er     Swelling  . Milk Protein Rash     Current Outpatient Prescriptions:  .  ACCU-CHEK AVIVA PLUS test strip, TEST once daily, Disp: 100 each, Rfl: 4 .  albuterol (PROAIR HFA) 108 (90 BASE) MCG/ACT inhaler, Inhale into the lungs., Disp: , Rfl:  .  aspirin 81 MG tablet, Take by mouth., Disp: , Rfl:  .  BIOTIN PO, Take by mouth daily. Reported on 02/05/2016, Disp: , Rfl:  .  budesonide-formoterol (SYMBICORT) 160-4.5 MCG/ACT inhaler, Inhale into the lungs., Disp: , Rfl:  .  Cholecalciferol 2000 UNITS CAPS, Take by mouth., Disp: ,  Rfl:  .  Cyanocobalamin (VITAMIN B 12 PO), Take by mouth daily., Disp: , Rfl:  .  EPINEPHrine 0.3 mg/0.3 mL IJ SOAJ injection, , Disp: , Rfl:  .  estradiol (ESTRACE) 0.5 MG tablet, take 1 tablet by mouth once daily, Disp: 30 tablet, Rfl: 5 .  gabapentin (NEURONTIN) 600 MG tablet, take 1 tablet by mouth three times a day, Disp: 90 tablet, Rfl: 4 .  levocetirizine (XYZAL) 5 MG tablet, Take by mouth., Disp: , Rfl:  .  lovastatin (MEVACOR) 40 MG tablet, Take 1 tablet by mouth at bedtime, Disp: , Rfl:  .  metFORMIN (GLUCOPHAGE) 500 MG tablet, take 2 tablets by mouth twice a day, Disp: 120 tablet, Rfl: 5 .  Multiple Vitamins-Minerals (MULTI-VITAMIN GUMMIES PO), Take by mouth daily., Disp: , Rfl:  .  spironolactone (ALDACTONE) 100 MG tablet, take 1 tablet by mouth once daily FOR BLOOD PRESSURE AND SWELLING, Disp: 30 tablet, Rfl: 5  Review of Systems  HENT: Positive for congestion, hoarse voice and sinus pressure.   Respiratory: Positive for cough.   Neurological: Positive for headaches.    Social History  Substance Use Topics  . Smoking status: Former Smoker    Packs/day: 0.75    Years: 22.00    Types: Cigarettes    Quit date: 07/27/1985  . Smokeless tobacco: Never Used     Comment: smoked  as teenager  . Alcohol use No   Objective:   BP 126/72 (BP Location: Right Arm, Patient Position: Sitting, Cuff Size: Normal)   Pulse 78   Temp 97.9 F (36.6 C)   Resp (!) 24   Wt 161 lb (73 kg)   SpO2 98%   BMI 32.52 kg/m  Vitals:   04/19/17 1005  BP: 126/72  Pulse: 78  Resp: (!) 24  Temp: 97.9 F (36.6 C)  SpO2: 98%  Weight: 161 lb (73 kg)     Physical Exam  General Appearance:    Alert, cooperative, no distress  HENT:   bilateral TM normal without fluid or infection, neck without nodes, frontal and maxillary sinus tenderness on right and nasal mucosa pale and congested  Eyes:    PERRL, conjunctiva/corneas clear, EOM's intact       Lungs:     Clear to auscultation bilaterally,  respirations unlabored  Heart:    Regular rate and rhythm  Neurologic:   Awake, alert, oriented x 3. No apparent focal neurological           defect.           Assessment & Plan:     1. Acute non-recurrent frontal sinusitis  - doxycycline (VIBRA-TABS) 100 MG tablet; Take 1 tablet (100 mg total) by mouth 2 (two) times daily.  Dispense: 14 tablet; Refill: 0  2. Carotid-cavernous fistula (HCC)  - MR Angiogram Head Wo Contrast; Future  3. Acute intractable headache, unspecified headache type  - MR Angiogram Head Wo Contrast; Future  4. Influenza vaccine needed  - Flu vaccine HIGH DOSE PF (Fluzone High dose)         [name], MD  [facility]

## 2017-04-19 NOTE — Telephone Encounter (Signed)
Please review. Thanks!  

## 2017-04-19 NOTE — Telephone Encounter (Signed)
Per Loma Sousa in scheduling at [facility] will not cover MRA for diagnosis given.Can you change to a different diagnosis ?

## 2017-04-20 DIAGNOSIS — J301 Allergic rhinitis due to pollen: Secondary | ICD-10-CM | POA: Diagnosis not present

## 2017-04-20 DIAGNOSIS — J3089 Other allergic rhinitis: Secondary | ICD-10-CM | POA: Diagnosis not present

## 2017-04-27 ENCOUNTER — Ambulatory Visit
Admission: RE | Admit: 2017-04-27 | Discharge: 2017-04-27 | Disposition: A | Discharge status: Home / Self Care | Payer: Medicare Other | Source: Ambulatory Visit | Attending: Family Medicine | Admitting: Family Medicine

## 2017-04-27 DIAGNOSIS — R519 Headache, unspecified: Secondary | ICD-10-CM

## 2017-04-27 DIAGNOSIS — I671 Cerebral aneurysm, nonruptured: Secondary | ICD-10-CM | POA: Diagnosis not present

## 2017-04-27 DIAGNOSIS — R51 Headache: Secondary | ICD-10-CM | POA: Diagnosis not present

## 2017-05-04 ENCOUNTER — Other Ambulatory Visit: Payer: Self-pay | Admitting: Family Medicine

## 2017-05-04 DIAGNOSIS — J3089 Other allergic rhinitis: Secondary | ICD-10-CM | POA: Diagnosis not present

## 2017-05-04 DIAGNOSIS — Z1231 Encounter for screening mammogram for malignant neoplasm of breast: Secondary | ICD-10-CM

## 2017-05-04 DIAGNOSIS — J301 Allergic rhinitis due to pollen: Secondary | ICD-10-CM | POA: Diagnosis not present

## 2017-05-13 DIAGNOSIS — J3089 Other allergic rhinitis: Secondary | ICD-10-CM | POA: Diagnosis not present

## 2017-05-13 DIAGNOSIS — J4530 Mild persistent asthma, uncomplicated: Secondary | ICD-10-CM | POA: Diagnosis not present

## 2017-05-13 DIAGNOSIS — L209 Atopic dermatitis, unspecified: Secondary | ICD-10-CM | POA: Diagnosis not present

## 2017-05-21 ENCOUNTER — Ambulatory Visit
Admission: RE | Admit: 2017-05-21 | Discharge: 2017-05-21 | Disposition: A | Discharge status: Home / Self Care | Payer: Medicare Other | Source: Ambulatory Visit | Attending: Family Medicine | Admitting: Family Medicine

## 2017-05-21 DIAGNOSIS — Z1231 Encounter for screening mammogram for malignant neoplasm of breast: Secondary | ICD-10-CM | POA: Diagnosis not present

## 2017-05-27 DIAGNOSIS — J301 Allergic rhinitis due to pollen: Secondary | ICD-10-CM | POA: Diagnosis not present

## 2017-05-27 DIAGNOSIS — J3081 Allergic rhinitis due to animal (cat) (dog) hair and dander: Secondary | ICD-10-CM | POA: Diagnosis not present

## 2017-05-27 DIAGNOSIS — J3089 Other allergic rhinitis: Secondary | ICD-10-CM | POA: Diagnosis not present

## 2017-06-15 DIAGNOSIS — J3089 Other allergic rhinitis: Secondary | ICD-10-CM | POA: Diagnosis not present

## 2017-06-15 DIAGNOSIS — J301 Allergic rhinitis due to pollen: Secondary | ICD-10-CM | POA: Diagnosis not present

## 2017-06-16 DIAGNOSIS — E782 Mixed hyperlipidemia: Secondary | ICD-10-CM | POA: Diagnosis not present

## 2017-06-16 DIAGNOSIS — R072 Precordial pain: Secondary | ICD-10-CM | POA: Diagnosis not present

## 2017-06-16 DIAGNOSIS — I10 Essential (primary) hypertension: Secondary | ICD-10-CM | POA: Diagnosis not present

## 2017-06-16 DIAGNOSIS — R0789 Other chest pain: Secondary | ICD-10-CM | POA: Diagnosis not present

## 2017-06-24 DIAGNOSIS — J3089 Other allergic rhinitis: Secondary | ICD-10-CM | POA: Diagnosis not present

## 2017-06-24 DIAGNOSIS — J301 Allergic rhinitis due to pollen: Secondary | ICD-10-CM | POA: Diagnosis not present

## 2017-06-29 DIAGNOSIS — J3089 Other allergic rhinitis: Secondary | ICD-10-CM | POA: Diagnosis not present

## 2017-06-29 DIAGNOSIS — J301 Allergic rhinitis due to pollen: Secondary | ICD-10-CM | POA: Diagnosis not present

## 2017-07-13 DIAGNOSIS — J3089 Other allergic rhinitis: Secondary | ICD-10-CM | POA: Diagnosis not present

## 2017-07-13 DIAGNOSIS — J301 Allergic rhinitis due to pollen: Secondary | ICD-10-CM | POA: Diagnosis not present

## 2017-07-15 ENCOUNTER — Ambulatory Visit (INDEPENDENT_AMBULATORY_CARE_PROVIDER_SITE_OTHER): Payer: Medicare Other

## 2017-07-15 VITALS — BP 128/68 | HR 72 | Temp 98.1°F | Ht 59.0 in | Wt 162.0 lb

## 2017-07-15 DIAGNOSIS — Z0000 Encounter for general adult medical examination without abnormal findings: Secondary | ICD-10-CM | POA: Diagnosis not present

## 2017-07-15 NOTE — Patient Instructions (Signed)
Sharon Nelson , Thank you for taking time to come for your Medicare Wellness Visit. I appreciate your ongoing commitment to your health goals. Please review the following plan we discussed and let me know if I can assist you in the future.   Screening recommendations/referrals: Colonoscopy: Up to date Mammogram: Up to date Bone Density: Up to date Recommended yearly ophthalmology/optometry visit for glaucoma screening and checkup Recommended yearly dental visit for hygiene and checkup  Vaccinations: Influenza vaccine: Up to date Pneumococcal vaccine: Up to date Tdap vaccine: Up to date Shingles vaccine: Pt declines today.   Advanced directives: Please bring a copy of your POA (Power of Attorney) and/or Living Will to your next appointment.   Conditions/risks identified: Recommend starting to exercise for 3 days a week for 30 minutes at a time.   Next appointment: 07/30/17 @ 9:00 AM   Preventive Care 65 Years and Older, Female Preventive care refers to lifestyle choices and visits with your health care provider that can promote health and wellness. What does preventive care include?  A yearly physical exam. This is also called an annual well check.  Dental exams once or twice a year.  Routine eye exams. Ask your health care provider how often you should have your eyes checked.  Personal lifestyle choices, including:  Daily care of your teeth and gums.  Regular physical activity.  Eating a healthy diet.  Avoiding tobacco and drug use.  Limiting alcohol use.  Practicing safe sex.  Taking low-dose aspirin every day.  Taking vitamin and mineral supplements as recommended by your health care provider. What happens during an annual well check? The services and screenings done by your health care provider during your annual well check will depend on your age, overall health, lifestyle risk factors, and family history of disease. Counseling  Your health care provider may ask you  questions about your:  Alcohol use.  Tobacco use.  Drug use.  Emotional well-being.  Home and relationship well-being.  Sexual activity.  Eating habits.  History of falls.  Memory and ability to understand (cognition).  Work and work Statistician.  Reproductive health. Screening  You may have the following tests or measurements:  Height, weight, and BMI.  Blood pressure.  Lipid and cholesterol levels. These may be checked every 5 years, or more frequently if you are over 29 years old.  Skin check.  Lung cancer screening. You may have this screening every year starting at age 69 if you have a 30-pack-year history of smoking and currently smoke or have quit within the past 15 years.  Fecal occult blood test (FOBT) of the stool. You may have this test every year starting at age 45.  Flexible sigmoidoscopy or colonoscopy. You may have a sigmoidoscopy every 5 years or a colonoscopy every 10 years starting at age 25.  Hepatitis C blood test.  Hepatitis B blood test.  Sexually transmitted disease (STD) testing.  Diabetes screening. This is done by checking your blood sugar (glucose) after you have not eaten for a while (fasting). You may have this done every 1-3 years.  Bone density scan. This is done to screen for osteoporosis. You may have this done starting at age 67.  Mammogram. This may be done every 1-2 years. Talk to your health care provider about how often you should have regular mammograms. Talk with your health care provider about your test results, treatment options, and if necessary, the need for more tests. Vaccines  Your health care provider may  recommend certain vaccines, such as:  Influenza vaccine. This is recommended every year.  Tetanus, diphtheria, and acellular pertussis (Tdap, Td) vaccine. You may need a Td booster every 10 years.  Zoster vaccine. You may need this after age 68.  Pneumococcal 13-valent conjugate (PCV13) vaccine. One dose is  recommended after age 35.  Pneumococcal polysaccharide (PPSV23) vaccine. One dose is recommended after age 14. Talk to your health care provider about which screenings and vaccines you need and how often you need them. This information is not intended to replace advice given to you by your health care provider. Make sure you discuss any questions you have with your health care provider. Document Released: 08/09/2015 Document Revised: 04/01/2016 Document Reviewed: 05/14/2015 Elsevier Interactive Patient Education  2017 [place] Prevention in the Home Falls can cause injuries. They can happen to people of all ages. There are many things you can do to make your home safe and to help prevent falls. What can I do on the outside of my home?  Regularly fix the edges of walkways and driveways and fix any cracks.  Remove anything that might make you trip as you walk through a door, such as a raised step or threshold.  Trim any bushes or trees on the path to your home.  Use bright outdoor lighting.  Clear any walking paths of anything that might make someone trip, such as rocks or tools.  Regularly check to see if handrails are loose or broken. Make sure that both sides of any steps have handrails.  Any raised decks and porches should have guardrails on the edges.  Have any leaves, snow, or ice cleared regularly.  Use sand or salt on walking paths during winter.  Clean up any spills in your garage right away. This includes oil or grease spills. What can I do in the bathroom?  Use night lights.  Install grab bars by the toilet and in the tub and shower. Do not use towel bars as grab bars.  Use non-skid mats or decals in the tub or shower.  If you need to sit down in the shower, use a plastic, non-slip stool.  Keep the floor dry. Clean up any water that spills on the floor as soon as it happens.  Remove soap buildup in the tub or shower regularly.  Attach bath mats  securely with double-sided non-slip rug tape.  Do not have throw rugs and other things on the floor that can make you trip. What can I do in the bedroom?  Use night lights.  Make sure that you have a light by your bed that is easy to reach.  Do not use any sheets or blankets that are too big for your bed. They should not hang down onto the floor.  Have a firm chair that has side arms. You can use this for support while you get dressed.  Do not have throw rugs and other things on the floor that can make you trip. What can I do in the kitchen?  Clean up any spills right away.  Avoid walking on wet floors.  Keep items that you use a lot in easy-to-reach places.  If you need to reach something above you, use a strong step stool that has a grab bar.  Keep electrical cords out of the way.  Do not use floor polish or wax that makes floors slippery. If you must use wax, use non-skid floor wax.  Do not have throw rugs and  other things on the floor that can make you trip. What can I do with my stairs?  Do not leave any items on the stairs.  Make sure that there are handrails on both sides of the stairs and use them. Fix handrails that are broken or loose. Make sure that handrails are as long as the stairways.  Check any carpeting to make sure that it is firmly attached to the stairs. Fix any carpet that is loose or worn.  Avoid having throw rugs at the top or bottom of the stairs. If you do have throw rugs, attach them to the floor with carpet tape.  Make sure that you have a light switch at the top of the stairs and the bottom of the stairs. If you do not have them, ask someone to add them for you. What else can I do to help prevent falls?  Wear shoes that:  Do not have high heels.  Have rubber bottoms.  Are comfortable and fit you well.  Are closed at the toe. Do not wear sandals.  If you use a stepladder:  Make sure that it is fully opened. Do not climb a closed  stepladder.  Make sure that both sides of the stepladder are locked into place.  Ask someone to hold it for you, if possible.  Clearly mark and make sure that you can see:  Any grab bars or handrails.  First and last steps.  Where the edge of each step is.  Use tools that help you move around (mobility aids) if they are needed. These include:  Canes.  Walkers.  Scooters.  Crutches.  Turn on the lights when you go into a dark area. Replace any light bulbs as soon as they burn out.  Set up your furniture so you have a clear path. Avoid moving your furniture around.  If any of your floors are uneven, fix them.  If there are any pets around you, be aware of where they are.  Review your medicines with your doctor. Some medicines can make you feel dizzy. This can increase your chance of falling. Ask your doctor what other things that you can do to help prevent falls. This information is not intended to replace advice given to you by your health care provider. Make sure you discuss any questions you have with your health care provider. Document Released: 05/09/2009 Document Revised: 12/19/2015 Document Reviewed: 08/17/2014 Elsevier Interactive Patient Education  2017 [employer].
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
## 2017-07-22 DIAGNOSIS — R072 Precordial pain: Secondary | ICD-10-CM | POA: Diagnosis not present

## 2017-07-22 DIAGNOSIS — E782 Mixed hyperlipidemia: Secondary | ICD-10-CM | POA: Diagnosis not present

## 2017-07-22 DIAGNOSIS — I10 Essential (primary) hypertension: Secondary | ICD-10-CM | POA: Diagnosis not present

## 2017-07-22 DIAGNOSIS — R0789 Other chest pain: Secondary | ICD-10-CM | POA: Diagnosis not present

## 2017-07-23 ENCOUNTER — Other Ambulatory Visit: Payer: Self-pay | Admitting: Family Medicine

## 2017-07-23 DIAGNOSIS — E1169 Type 2 diabetes mellitus with other specified complication: Secondary | ICD-10-CM

## 2017-07-23 MED ORDER — METFORMIN HCL 500 MG PO TABS: 1000.0000 mg | ORAL_TABLET | Freq: Two times a day (BID) | ORAL | 5 refills | Status: DC

## 2017-07-23 NOTE — Telephone Encounter (Signed)
[facility] called to request metFORMIN (GLUCOPHAGE) 500 MG tablet for mutual patient.  She uses [facility]. [place].

## 2017-07-30 ENCOUNTER — Ambulatory Visit (INDEPENDENT_AMBULATORY_CARE_PROVIDER_SITE_OTHER): Payer: Medicare Other | Admitting: Family Medicine

## 2017-07-30 ENCOUNTER — Encounter: Payer: Self-pay | Admitting: Family Medicine

## 2017-07-30 VITALS — BP 142/72 | HR 73 | Temp 98.7°F | Resp 16 | Ht 60.0 in | Wt 167.0 lb

## 2017-07-30 DIAGNOSIS — I10 Essential (primary) hypertension: Secondary | ICD-10-CM | POA: Diagnosis not present

## 2017-07-30 DIAGNOSIS — Z0000 Encounter for general adult medical examination without abnormal findings: Secondary | ICD-10-CM | POA: Diagnosis not present

## 2017-07-30 DIAGNOSIS — Z136 Encounter for screening for cardiovascular disorders: Secondary | ICD-10-CM

## 2017-07-30 DIAGNOSIS — E7800 Pure hypercholesterolemia, unspecified: Secondary | ICD-10-CM | POA: Diagnosis not present

## 2017-07-30 DIAGNOSIS — Z1239 Encounter for other screening for malignant neoplasm of breast: Secondary | ICD-10-CM

## 2017-07-30 DIAGNOSIS — Z1159 Encounter for screening for other viral diseases: Secondary | ICD-10-CM

## 2017-07-30 DIAGNOSIS — Z1231 Encounter for screening mammogram for malignant neoplasm of breast: Secondary | ICD-10-CM | POA: Diagnosis not present

## 2017-07-30 DIAGNOSIS — Z23 Encounter for immunization: Secondary | ICD-10-CM

## 2017-07-30 DIAGNOSIS — E1169 Type 2 diabetes mellitus with other specified complication: Secondary | ICD-10-CM

## 2017-07-30 DIAGNOSIS — L821 Other seborrheic keratosis: Secondary | ICD-10-CM | POA: Diagnosis not present

## 2017-07-30 DIAGNOSIS — L819 Disorder of pigmentation, unspecified: Secondary | ICD-10-CM | POA: Diagnosis not present

## 2017-07-30 DIAGNOSIS — J3089 Other allergic rhinitis: Secondary | ICD-10-CM | POA: Diagnosis not present

## 2017-07-30 DIAGNOSIS — J301 Allergic rhinitis due to pollen: Secondary | ICD-10-CM | POA: Diagnosis not present

## 2017-07-30 NOTE — Patient Instructions (Signed)
   The CDC recommends two doses of Shingrix (the shingles vaccine) separated by 2 to 6 months for adults age 69 years and older. I recommend checking with your insurance plan regarding coverage for this vaccine.   

## 2017-07-30 NOTE — Progress Notes (Signed)
Patient: Sharon Nelson Female    DOB: Dec 13, 1950   67 y.o.   MRN: [id number] Visit Date: 07/30/2017  Today's Provider: [name], MD   Chief Complaint  Patient presents with  . Diabetes  . Hypertension  . Hyperlipidemia   Subjective:   Follow-up for AVW with [facility] on 07/15/2017.   HPI  Diabetes Mellitus Type II, Follow-up:   Lab Results  Component Value Date   HGBA1C 8.0 09/25/2016   HGBA1C 7.0 04/05/2015   HGBA1C 7.6 (A) 11/22/2014   Patients diabetes was previously followed by Endocrinology. Was previously followed by Dr. Burnard Bunting who has moved away. Has had no diabetes follow up since.  She reports good compliance with treatment. She is not having side effects.  Current symptoms include none and have been stable. Home blood sugar records: fasting range: 129-139  Episodes of hypoglycemia? no   Current Insulin Regimen: none Most Recent Eye Exam: <1 year ago Weight trend: increasing steadily Prior visit with dietician: no Current diet: in general, an "unhealthy" diet Current exercise: none  Pertinent Labs:    Component Value Date/Time   CHOL 176 08/23/2014   TRIG 269 (A) 08/23/2014   HDL 45 08/23/2014   LDLCALC 77 08/23/2014   CREATININE 0.9 11/22/2014   CREATININE 1.04 02/12/2014 0353    Wt Readings from Last 3 Encounters:  07/15/17 162 lb (73.5 kg)  04/19/17 161 lb (73 kg)  08/28/16 159 lb 14.4 oz (72.5 kg)    ------------------------------------------------------------------------  Hypertension:  Is doing well with current medications. Home blood pressures usually 130s/80s. Tolerating medications well without adverse effects.   Hypercholesterolemia:  Is doing well with lovastatin with no adverse effects. Lipids [facility] checked at [facility] 06/2016 with TC=176, LDL of 104 and HDL of 40.9  She also has very dark exophytic mole on right flank that has been getting large.    Allergies  Allergen Reactions  . Lisinopril Anaphylaxis and Swelling   WAS ON LIFE SUPPORT   . Iodinated Diagnostic Agents Rash    IV dye, red dye Other Reaction: feeling of heat  . Cinnamon Swelling    Other reaction(s): Swollen lips  . Peanut Oil     Other reaction(s): Swelling  . Penicillins Swelling    Other reaction(s): Swelling  . Sulfa Antibiotics Swelling    Other reaction(s): Swelling  . Verapamil Hcl Er     Swelling  . Milk Protein Rash     Current Outpatient Medications:  .  ACCU-CHEK AVIVA PLUS test strip, TEST once daily, Disp: 100 each, Rfl: 4 .  albuterol (PROAIR HFA) 108 (90 BASE) MCG/ACT inhaler, Inhale into the lungs every 4 (four) hours as needed. , Disp: , Rfl:  .  aspirin 81 MG tablet, Take 81 mg by mouth daily. , Disp: , Rfl:  .  BIOTIN PO, Take by mouth daily. Reported on 02/05/2016, Disp: , Rfl:  .  budesonide-formoterol (SYMBICORT) 160-4.5 MCG/ACT inhaler, Inhale into the lungs as needed. , Disp: , Rfl:  .  Cholecalciferol 2000 UNITS CAPS, Take by mouth daily. , Disp: , Rfl:  .  Cyanocobalamin (VITAMIN B 12 PO), Take by mouth daily., Disp: , Rfl:  .  EPINEPHrine 0.3 mg/0.3 mL IJ SOAJ injection, , Disp: , Rfl:  .  estradiol (ESTRACE) 0.5 MG tablet, take 1 tablet by mouth once daily, Disp: 30 tablet, Rfl: 5 .  gabapentin (NEURONTIN) 600 MG tablet, take 1 tablet by mouth three times a day, Disp: 90 tablet,  Rfl: 4 .  levocetirizine (XYZAL) 5 MG tablet, Take 5 mg by mouth every evening. , Disp: , Rfl:  .  lovastatin (MEVACOR) 40 MG tablet, Take 1 tablet by mouth at bedtime, Disp: , Rfl:  .  metFORMIN (GLUCOPHAGE) 500 MG tablet, Take 2 tablets (1,000 mg total) by mouth 2 (two) times daily., Disp: 120 tablet, Rfl: 5 .  spironolactone (ALDACTONE) 100 MG tablet, take 1 tablet by mouth once daily FOR BLOOD PRESSURE AND SWELLING, Disp: 30 tablet, Rfl: 5  Review of Systems  Constitutional: Positive for activity change. Negative for appetite change, chills, fatigue and fever.  HENT: Negative for congestion, ear pain, rhinorrhea, sneezing  and sore throat.   Eyes: Positive for visual disturbance. Negative for pain and redness.  Respiratory: Negative for cough, chest tightness, shortness of breath and wheezing.   Cardiovascular: Negative for chest pain, palpitations and leg swelling.  Gastrointestinal: Negative for abdominal pain, blood in stool, constipation, diarrhea, nausea and vomiting.  Endocrine: Negative for polydipsia and polyphagia.  Genitourinary: Negative.  Negative for dysuria, flank pain, hematuria, pelvic pain, vaginal bleeding and vaginal discharge.  Musculoskeletal: Positive for arthralgias (right shoulder), back pain and neck pain (left side of neck). Negative for gait problem and joint swelling.  Skin: Negative for rash.       Mole under left arm  Neurological: Negative.  Negative for dizziness, tremors, seizures, weakness, light-headedness, numbness and headaches.  Hematological: Negative for adenopathy.  Psychiatric/Behavioral: Negative.  Negative for behavioral problems, confusion and dysphoric mood. The patient is not nervous/anxious and is not hyperactive.        Crying    Social History   Tobacco Use  . Smoking status: Former Smoker    Packs/day: 0.75    Years: 22.00    Pack years: 16.50    Types: Cigarettes    Last attempt to quit: 07/27/1985    Years since quitting: 32.0  . Smokeless tobacco: Never Used  . Tobacco comment: smoked as teenager  Substance Use Topics  . Alcohol use: No   Objective:   BP (!) 142/72 (BP Location: Left Arm, Patient Position: Sitting, Cuff Size: Normal)   Pulse 73   Temp 98.7 F (37.1 C) (Oral)   Resp 16   Ht 5' (1.524 m)   Wt 167 lb (75.8 kg)   SpO2 97% Comment: room air  BMI 32.61 kg/m  There were no vitals filed for this visit.   Physical Exam  General Appearance:    Alert, cooperative, no distress, appears stated age  Head:    Normocephalic, without obvious abnormality, atraumatic  Eyes:    PERRL, conjunctiva/corneas clear, EOM's intact, fundi     benign, both eyes  Ears:    Normal TM's and external ear canals, both ears  Nose:   Nares normal, septum midline, mucosa normal, no drainage    or sinus tenderness  Throat:   Lips, mucosa, and tongue normal; teeth and gums normal  Neck:   Supple, symmetrical, trachea midline, no adenopathy;    thyroid:  no enlargement/tenderness/nodules; no carotid   bruit or JVD  Back:     Symmetric, no curvature, ROM normal, no CVA tenderness  Lungs:     Clear to auscultation bilaterally, respirations unlabored  Chest Wall:    No tenderness or deformity   Heart:    Regular rate and rhythm, S1 and S2 normal, no murmur, rub   or gallop  Breast Exam:    normal appearance, no masses or tenderness  Abdomen:     Soft, non-tender, bowel sounds active all four quadrants,    no masses, no organomegaly  Pelvic:    not indicated; status post hysterectomy, negative ROS  Extremities:   Extremities normal, atraumatic, no cyanosis or edema  Pulses:   2+ and symmetric all extremities  Skin:   Skin color, texture, turgor normal, no rashes. 3/4 cm very dark fleshy lesion protruding from right flank.   Lymph nodes:   Cervical, supraclavicular, and axillary nodes normal  Neurologic:   CNII-XII intact, normal strength, sensation and reflexes    throughout       Assessment & Plan:      1. Annual physical exam   2. Breast cancer screening Patient given contact information to schedule mammogram.   3. Hypercholesteremia She is tolerating lovastatin well with no adverse effects.   - Lipid panel - Comprehensive metabolic panel  4. Type 2 diabetes mellitus with other specified complication, without long-term current use of insulin (HCC)  - Comprehensive metabolic panel - Hemoglobin A1c  5. Need for pneumococcal vaccination  - Pneumococcal polysaccharide vaccine 23-valent greater than or equal to 2yo subcutaneous/IM  6. Need for hepatitis C screening test  - Hepatitis C antibody  7. Hyperpigmented skin  lesion Anesthetized with 1% lidocaine with epinephrine and excised lesion for pathologic review with sterile scissors.  - Pathology; Future - Pathology  8. Essential (primary) hypertension Well controlled.  Continue current medications.        [name], MD  [facility]

## 2017-07-31 LAB — COMPREHENSIVE METABOLIC PANEL
ALT: 22 IU/L (ref 0–32)
AST: 23 IU/L (ref 0–40)
Albumin/Globulin Ratio: 1.5 (ref 1.2–2.2)
Albumin: 4.4 g/dL (ref 3.6–4.8)
Alkaline Phosphatase: 78 IU/L (ref 39–117)
BUN/Creatinine Ratio: 10 — ABNORMAL LOW (ref 12–28)
BUN: 9 mg/dL (ref 8–27)
Bilirubin Total: 0.3 mg/dL (ref 0.0–1.2)
CO2: 19 mmol/L — ABNORMAL LOW (ref 20–29)
Calcium: 9.6 mg/dL (ref 8.7–10.3)
Chloride: 103 mmol/L (ref 96–106)
Creatinine, Ser: 0.87 mg/dL (ref 0.57–1.00)
GFR calc Af Amer: 79 mL/min/{1.73_m2} (ref >59)
GFR calc non Af Amer: 69 mL/min/{1.73_m2} (ref >59)
Globulin, Total: 3 g/dL (ref 1.5–4.5)
Glucose: 183 mg/dL — ABNORMAL HIGH (ref 65–99)
Potassium: 5.1 mmol/L (ref 3.5–5.2)
Sodium: 140 mmol/L (ref 134–144)
Total Protein: 7.4 g/dL (ref 6.0–8.5)

## 2017-07-31 LAB — HEPATITIS C ANTIBODY: Hep C Virus Ab: <0.1 s/co ratio (ref 0.0–0.9)

## 2017-07-31 LAB — HEMOGLOBIN A1C
Est. average glucose Bld gHb Est-mCnc: 194 mg/dL
Hgb A1c MFr Bld: 8.4 % — ABNORMAL HIGH (ref 4.8–5.6)

## 2017-07-31 LAB — LIPID PANEL
Chol/HDL Ratio: 3.7 ratio (ref 0.0–4.4)
Cholesterol, Total: 171 mg/dL (ref 100–199)
HDL: 46 mg/dL (ref >39)
LDL Calculated: 96 mg/dL (ref 0–99)
Triglycerides: 147 mg/dL (ref 0–149)
VLDL Cholesterol Cal: 29 mg/dL (ref 5–40)

## 2017-08-02 ENCOUNTER — Telehealth: Payer: Self-pay

## 2017-08-02 NOTE — Telephone Encounter (Signed)
-----   Message from Birdie Sons, MD sent at 08/02/2017 12:05 PM EST ----- a1c is up to 8.4 %, needs to be under 6.5%. Cholesterol is a little high.  Recommend she add glipizide XL 2.5mg  once a day  And continue metformin 500mg  two twice daily, follow up for dm and lipids in 3 months.

## 2017-08-02 NOTE — Telephone Encounter (Signed)
OK. Just be sure to return in 3 months for recheck.

## 2017-08-02 NOTE — Telephone Encounter (Signed)
Advised patient of results. Patient admits to not being compliant with taking her Metformin. She reports that she is supposed to be taking (2) tablets twice daily and she has only been taking (1) tablet twice a day. She also admits to skipping her daily doses on some days. She would like to start back taking it the way it is prescribed before adding another medication.

## 2017-08-03 LAB — PATHOLOGY

## 2017-08-03 NOTE — Telephone Encounter (Signed)
Patient advised as below.  

## 2017-08-05 DIAGNOSIS — J301 Allergic rhinitis due to pollen: Secondary | ICD-10-CM | POA: Diagnosis not present

## 2017-08-05 DIAGNOSIS — J3089 Other allergic rhinitis: Secondary | ICD-10-CM | POA: Diagnosis not present

## 2017-08-10 DIAGNOSIS — J301 Allergic rhinitis due to pollen: Secondary | ICD-10-CM | POA: Diagnosis not present

## 2017-08-10 DIAGNOSIS — J3089 Other allergic rhinitis: Secondary | ICD-10-CM | POA: Diagnosis not present

## 2017-08-17 DIAGNOSIS — J301 Allergic rhinitis due to pollen: Secondary | ICD-10-CM | POA: Diagnosis not present

## 2017-08-17 DIAGNOSIS — J3089 Other allergic rhinitis: Secondary | ICD-10-CM | POA: Diagnosis not present

## 2017-08-23 ENCOUNTER — Other Ambulatory Visit: Payer: Self-pay | Admitting: Family Medicine

## 2017-08-26 DIAGNOSIS — J3089 Other allergic rhinitis: Secondary | ICD-10-CM | POA: Diagnosis not present

## 2017-08-26 DIAGNOSIS — J301 Allergic rhinitis due to pollen: Secondary | ICD-10-CM | POA: Diagnosis not present

## 2017-08-31 DIAGNOSIS — J3089 Other allergic rhinitis: Secondary | ICD-10-CM | POA: Diagnosis not present

## 2017-08-31 DIAGNOSIS — J301 Allergic rhinitis due to pollen: Secondary | ICD-10-CM | POA: Diagnosis not present

## 2017-09-06 ENCOUNTER — Other Ambulatory Visit: Payer: Self-pay | Admitting: Family Medicine

## 2017-09-09 DIAGNOSIS — J3089 Other allergic rhinitis: Secondary | ICD-10-CM | POA: Diagnosis not present

## 2017-09-09 DIAGNOSIS — J301 Allergic rhinitis due to pollen: Secondary | ICD-10-CM | POA: Diagnosis not present

## 2017-09-14 DIAGNOSIS — J301 Allergic rhinitis due to pollen: Secondary | ICD-10-CM | POA: Diagnosis not present

## 2017-09-14 DIAGNOSIS — J3089 Other allergic rhinitis: Secondary | ICD-10-CM | POA: Diagnosis not present

## 2017-09-21 DIAGNOSIS — J301 Allergic rhinitis due to pollen: Secondary | ICD-10-CM | POA: Diagnosis not present

## 2017-09-21 DIAGNOSIS — J3089 Other allergic rhinitis: Secondary | ICD-10-CM | POA: Diagnosis not present

## 2017-09-28 DIAGNOSIS — J301 Allergic rhinitis due to pollen: Secondary | ICD-10-CM | POA: Diagnosis not present

## 2017-09-28 DIAGNOSIS — J3089 Other allergic rhinitis: Secondary | ICD-10-CM | POA: Diagnosis not present

## 2017-10-04 ENCOUNTER — Ambulatory Visit: Payer: Medicare Other | Admitting: Family Medicine

## 2017-10-04 DIAGNOSIS — M7541 Impingement syndrome of right shoulder: Secondary | ICD-10-CM | POA: Diagnosis not present

## 2017-10-04 DIAGNOSIS — M62838 Other muscle spasm: Secondary | ICD-10-CM | POA: Diagnosis not present

## 2017-10-05 DIAGNOSIS — J301 Allergic rhinitis due to pollen: Secondary | ICD-10-CM | POA: Diagnosis not present

## 2017-10-05 DIAGNOSIS — J3089 Other allergic rhinitis: Secondary | ICD-10-CM | POA: Diagnosis not present

## 2017-10-14 DIAGNOSIS — J3089 Other allergic rhinitis: Secondary | ICD-10-CM | POA: Diagnosis not present

## 2017-10-14 DIAGNOSIS — J301 Allergic rhinitis due to pollen: Secondary | ICD-10-CM | POA: Diagnosis not present

## 2017-10-19 DIAGNOSIS — J301 Allergic rhinitis due to pollen: Secondary | ICD-10-CM | POA: Diagnosis not present

## 2017-10-19 DIAGNOSIS — J3089 Other allergic rhinitis: Secondary | ICD-10-CM | POA: Diagnosis not present

## 2017-11-02 DIAGNOSIS — J301 Allergic rhinitis due to pollen: Secondary | ICD-10-CM | POA: Diagnosis not present

## 2017-11-02 DIAGNOSIS — J3089 Other allergic rhinitis: Secondary | ICD-10-CM | POA: Diagnosis not present

## 2017-11-09 DIAGNOSIS — J3089 Other allergic rhinitis: Secondary | ICD-10-CM | POA: Diagnosis not present

## 2017-11-09 DIAGNOSIS — J301 Allergic rhinitis due to pollen: Secondary | ICD-10-CM | POA: Diagnosis not present

## 2017-11-23 DIAGNOSIS — J301 Allergic rhinitis due to pollen: Secondary | ICD-10-CM | POA: Diagnosis not present

## 2017-11-23 DIAGNOSIS — J3089 Other allergic rhinitis: Secondary | ICD-10-CM | POA: Diagnosis not present

## 2017-12-02 DIAGNOSIS — J3089 Other allergic rhinitis: Secondary | ICD-10-CM | POA: Diagnosis not present

## 2017-12-02 DIAGNOSIS — J301 Allergic rhinitis due to pollen: Secondary | ICD-10-CM | POA: Diagnosis not present

## 2017-12-07 DIAGNOSIS — J3089 Other allergic rhinitis: Secondary | ICD-10-CM | POA: Diagnosis not present

## 2017-12-07 DIAGNOSIS — J301 Allergic rhinitis due to pollen: Secondary | ICD-10-CM | POA: Diagnosis not present

## 2017-12-14 DIAGNOSIS — J301 Allergic rhinitis due to pollen: Secondary | ICD-10-CM | POA: Diagnosis not present

## 2017-12-14 DIAGNOSIS — J3089 Other allergic rhinitis: Secondary | ICD-10-CM | POA: Diagnosis not present

## 2017-12-15 ENCOUNTER — Encounter: Payer: Self-pay | Admitting: Family Medicine

## 2017-12-15 ENCOUNTER — Ambulatory Visit (INDEPENDENT_AMBULATORY_CARE_PROVIDER_SITE_OTHER): Payer: Medicare Other | Admitting: Family Medicine

## 2017-12-15 VITALS — BP 120/68 | HR 66 | Temp 98.7°F | Resp 16 | Wt 159.0 lb

## 2017-12-15 DIAGNOSIS — S86911S Strain of unspecified muscle(s) and tendon(s) at lower leg level, right leg, sequela: Secondary | ICD-10-CM | POA: Diagnosis not present

## 2017-12-15 DIAGNOSIS — E1169 Type 2 diabetes mellitus with other specified complication: Secondary | ICD-10-CM

## 2017-12-15 LAB — POCT GLYCOSYLATED HEMOGLOBIN (HGB A1C)
Est. average glucose Bld gHb Est-mCnc: 174
Hemoglobin A1C: 7.7 % — AB (ref 4.0–5.6)

## 2017-12-15 MED ORDER — NAPROXEN 500 MG PO TABS: 500.0000 mg | ORAL_TABLET | Freq: Two times a day (BID) | ORAL | 0 refills | Status: AC

## 2017-12-15 NOTE — Progress Notes (Signed)
Patient: Sharon Nelson Female    DOB: 09-16-1950   67 y.o.   MRN: [id number] Visit Date: 12/15/2017  Today's Provider: [name], MD   Chief Complaint  Patient presents with  . Knee Pain    x 2 weeks   Subjective:    Knee Pain   Incident onset: 3 weeks ago. There was no injury mechanism. The pain is present in the right knee. The quality of the pain is described as aching. The pain has been worsening since onset. Pertinent negatives include no numbness or tingling. The symptoms are aggravated by weight bearing. Treatments tried: ACE wrap. The treatment provided mild relief.  Patient states she has a knot in her right knee that has increased in size over the past couple of weeks. She states she stepped down hard onto knee about a month ago and it has been hurting ever since.  She had knee replacement around 20 years ago.   She is also due for follow up diabetes. Last a1c in January 8.4%. Has been more strict with diet and trying to get more exercise. Is taking metformin consistently.  Wt Readings from Last 3 Encounters:  12/15/17 159 lb (72.1 kg)  07/30/17 167 lb (75.8 kg)  07/15/17 162 lb (73.5 kg)       Allergies  Allergen Reactions  . Lisinopril Anaphylaxis and Swelling    WAS ON LIFE SUPPORT   . Iodinated Diagnostic Agents Rash    IV dye, red dye Other Reaction: feeling of heat  . Cinnamon Swelling    Other reaction(s): Swollen lips  . Peanut Oil     Other reaction(s): Swelling  . Penicillins Swelling    Other reaction(s): Swelling  . Sulfa Antibiotics Swelling    Other reaction(s): Swelling  . Verapamil Hcl Er     Swelling  . Milk Protein Rash     Current Outpatient Medications:  .  ACCU-CHEK AVIVA PLUS test strip, TEST once daily, Disp: 100 each, Rfl: 4 .  albuterol (PROAIR HFA) 108 (90 BASE) MCG/ACT inhaler, Inhale into the lungs every 4 (four) hours as needed. , Disp: , Rfl:  .  aspirin 81 MG tablet, Take 81 mg by mouth daily. , Disp: , Rfl:  .   BIOTIN PO, Take by mouth daily. Reported on 02/05/2016, Disp: , Rfl:  .  budesonide-formoterol (SYMBICORT) 160-4.5 MCG/ACT inhaler, Inhale into the lungs as needed. , Disp: , Rfl:  .  Cholecalciferol 2000 UNITS CAPS, Take by mouth daily. , Disp: , Rfl:  .  Cyanocobalamin (VITAMIN B 12 PO), Take by mouth daily., Disp: , Rfl:  .  EPINEPHrine 0.3 mg/0.3 mL IJ SOAJ injection, , Disp: , Rfl:  .  estradiol (ESTRACE) 0.5 MG tablet, take 1 tablet by mouth once daily, Disp: 30 tablet, Rfl: 5 .  gabapentin (NEURONTIN) 600 MG tablet, take 1 tablet by mouth three times a day, Disp: 90 tablet, Rfl: 4 .  levocetirizine (XYZAL) 5 MG tablet, Take 5 mg by mouth every evening. , Disp: , Rfl:  .  lovastatin (MEVACOR) 40 MG tablet, Take 1 tablet by mouth at bedtime, Disp: , Rfl:  .  metFORMIN (GLUCOPHAGE) 500 MG tablet, Take 2 tablets (1,000 mg total) by mouth 2 (two) times daily., Disp: 120 tablet, Rfl: 5 .  spironolactone (ALDACTONE) 100 MG tablet, take 1 tablet by mouth once daily for blood pressure and SWELLING, Disp: 30 tablet, Rfl: 11  Review of Systems  Constitutional: Negative for appetite  change, chills, fatigue and fever.  Respiratory: Negative for chest tightness and shortness of breath.   Cardiovascular: Negative for chest pain and palpitations.  Gastrointestinal: Negative for abdominal pain, nausea and vomiting.  Musculoskeletal: Positive for arthralgias (right knee) and joint swelling (right knee).  Neurological: Negative for dizziness, tingling, weakness and numbness.    Social History   Tobacco Use  . Smoking status: Former Smoker    Packs/day: 0.75    Years: 22.00    Pack years: 16.50    Types: Cigarettes    Last attempt to quit: 07/27/1985    Years since quitting: 32.4  . Smokeless tobacco: Never Used  . Tobacco comment: smoked as teenager  Substance Use Topics  . Alcohol use: No   Objective:   BP 120/68 (BP Location: Left Arm, Patient Position: Sitting, Cuff Size: Large)   Pulse 66    Temp 98.7 F (37.1 C) (Oral)   Resp 16   Wt 159 lb (72.1 kg)   SpO2 98% Comment: room air  BMI 31.05 kg/m     Physical Exam  General appearance: alert, well developed, well nourished, cooperative and in no distress Head: Normocephalic, without obvious abnormality, atraumatic Respiratory: Respirations even and unlabored, normal respiratory rate Extremities: tender and slightly swollen right anterior knee just below patella. Some pain on active extension.   Results for orders placed or performed in visit on 12/15/17  POCT HgB A1C  Result Value Ref Range   Hemoglobin A1C 7.7 (A) 4.0 - 5.6 %   HbA1c, POC (prediabetic range)  5.7 - 6.4 %   HbA1c, POC (controlled diabetic range)  0.0 - 7.0 %   Est. average glucose Bld gHb Est-mCnc 174        Assessment & Plan:     1. Strain of right knee, sequela Try oral NSAIDs and keep knee wrapped with elastic brace whenever ambulating.  - naproxen (NAPROSYN) 500 MG tablet; Take 1 tablet (500 mg total) by mouth 2 (two) times daily with a meal for 15 days.  Dispense: 30 tablet; Refill: 0 Will call back for orthopedic referral if not much better next week.   2. Type 2 diabetes mellitus with other specified complication, without long-term current use of insulin ([place]) Is doing well with diet with 8 pound weight lass since last visit. Continue current medications.  Continue working on weight loss and healthy diet.  - POCT HgB A1C  Return in about 4 months (around 04/17/2018).       [name], MD  [facility]

## 2017-12-15 NOTE — Patient Instructions (Signed)
   Continue using elastic knee wrap every day whenever you are on your feet.    Call for referral to an orthopedist if you are not doing better within a week

## 2017-12-21 DIAGNOSIS — J3089 Other allergic rhinitis: Secondary | ICD-10-CM | POA: Diagnosis not present

## 2017-12-21 DIAGNOSIS — J301 Allergic rhinitis due to pollen: Secondary | ICD-10-CM | POA: Diagnosis not present

## 2017-12-28 DIAGNOSIS — J301 Allergic rhinitis due to pollen: Secondary | ICD-10-CM | POA: Diagnosis not present

## 2017-12-28 DIAGNOSIS — J3089 Other allergic rhinitis: Secondary | ICD-10-CM | POA: Diagnosis not present

## 2018-01-04 DIAGNOSIS — J3089 Other allergic rhinitis: Secondary | ICD-10-CM | POA: Diagnosis not present

## 2018-01-04 DIAGNOSIS — J301 Allergic rhinitis due to pollen: Secondary | ICD-10-CM | POA: Diagnosis not present

## 2018-01-11 DIAGNOSIS — J3089 Other allergic rhinitis: Secondary | ICD-10-CM | POA: Diagnosis not present

## 2018-01-11 DIAGNOSIS — J301 Allergic rhinitis due to pollen: Secondary | ICD-10-CM | POA: Diagnosis not present

## 2018-01-20 DIAGNOSIS — J3089 Other allergic rhinitis: Secondary | ICD-10-CM | POA: Diagnosis not present

## 2018-01-20 DIAGNOSIS — E782 Mixed hyperlipidemia: Secondary | ICD-10-CM | POA: Diagnosis not present

## 2018-01-20 DIAGNOSIS — E119 Type 2 diabetes mellitus without complications: Secondary | ICD-10-CM | POA: Diagnosis not present

## 2018-01-20 DIAGNOSIS — I10 Essential (primary) hypertension: Secondary | ICD-10-CM | POA: Diagnosis not present

## 2018-01-20 DIAGNOSIS — J301 Allergic rhinitis due to pollen: Secondary | ICD-10-CM | POA: Diagnosis not present

## 2018-01-25 DIAGNOSIS — J301 Allergic rhinitis due to pollen: Secondary | ICD-10-CM | POA: Diagnosis not present

## 2018-01-25 DIAGNOSIS — J3089 Other allergic rhinitis: Secondary | ICD-10-CM | POA: Diagnosis not present

## 2018-02-08 DIAGNOSIS — J3089 Other allergic rhinitis: Secondary | ICD-10-CM | POA: Diagnosis not present

## 2018-02-08 DIAGNOSIS — J301 Allergic rhinitis due to pollen: Secondary | ICD-10-CM | POA: Diagnosis not present

## 2018-02-17 DIAGNOSIS — J3089 Other allergic rhinitis: Secondary | ICD-10-CM | POA: Diagnosis not present

## 2018-02-17 DIAGNOSIS — J301 Allergic rhinitis due to pollen: Secondary | ICD-10-CM | POA: Diagnosis not present

## 2018-02-22 DIAGNOSIS — J301 Allergic rhinitis due to pollen: Secondary | ICD-10-CM | POA: Diagnosis not present

## 2018-02-22 DIAGNOSIS — J3089 Other allergic rhinitis: Secondary | ICD-10-CM | POA: Diagnosis not present

## 2018-02-22 NOTE — Progress Notes (Signed)
Patient: Sharon Nelson Female    DOB: Dec 16, 1950   67 y.o.   MRN: [id number] Visit Date: 02/23/2018  Today's Provider: [name], MD   Chief Complaint  Patient presents with  . GI Problem   Subjective:    GI Problem  The primary symptoms include fatigue, abdominal pain and nausea. Primary symptoms do not include fever, weight loss, vomiting, diarrhea, melena, hematemesis, jaundice, hematochezia, dysuria, myalgias, arthralgias or rash. The illness began more than 7 days ago (several months). The onset was gradual. The problem has been gradually worsening.  The illness is also significant for bloating. The illness does not include chills, anorexia, dysphagia, odynophagia, constipation, tenesmus, back pain or itching. Associated medical issues comments: gallbladder removed.    Patient states for the last several months she has been having an upset stomach daily. Patient states she had her gallbladder removed around 2010. Patient states when she takes an antacid to relieve pain she will have diarrhea. Antiacid only helps upset stomach for a about 30 minutes. Patient states she will wake up with an upset stomach. Patient states usually when she eats her stomach will feel better for a while but then pain will come back. Other symptoms include bloated stomach, nausea, and fatigue. Has had no recent change in medications and not taking OTC NSAIDs.     Allergies  Allergen Reactions  . Lisinopril Anaphylaxis and Swelling    WAS ON LIFE SUPPORT   . Iodinated Diagnostic Agents Rash    IV dye, red dye Other Reaction: feeling of heat  . Cinnamon Swelling    Other reaction(s): Swollen lips  . Peanut Oil     Other reaction(s): Swelling  . Penicillins Swelling    Other reaction(s): Swelling  . Sulfa Antibiotics Swelling    Other reaction(s): Swelling  . Verapamil Hcl Er     Swelling  . Milk Protein Rash     Current Outpatient Medications:  .  ACCU-CHEK AVIVA PLUS test strip, TEST  once daily, Disp: 100 each, Rfl: 4 .  albuterol (PROAIR HFA) 108 (90 BASE) MCG/ACT inhaler, Inhale into the lungs every 4 (four) hours as needed. , Disp: , Rfl:  .  aspirin 81 MG tablet, Take 81 mg by mouth daily. , Disp: , Rfl:  .  BIOTIN PO, Take by mouth daily. Reported on 02/05/2016, Disp: , Rfl:  .  budesonide-formoterol (SYMBICORT) 160-4.5 MCG/ACT inhaler, Inhale into the lungs as needed. , Disp: , Rfl:  .  Cholecalciferol 2000 UNITS CAPS, Take by mouth daily. , Disp: , Rfl:  .  Cyanocobalamin (VITAMIN B 12 PO), Take by mouth daily., Disp: , Rfl:  .  EPINEPHrine 0.3 mg/0.3 mL IJ SOAJ injection, , Disp: , Rfl:  .  estradiol (ESTRACE) 0.5 MG tablet, take 1 tablet by mouth once daily, Disp: 30 tablet, Rfl: 5 .  gabapentin (NEURONTIN) 600 MG tablet, take 1 tablet by mouth three times a day, Disp: 90 tablet, Rfl: 4 .  levocetirizine (XYZAL) 5 MG tablet, Take 5 mg by mouth every evening. , Disp: , Rfl:  .  lovastatin (MEVACOR) 40 MG tablet, Take 1 tablet by mouth at bedtime, Disp: , Rfl:  .  metFORMIN (GLUCOPHAGE) 500 MG tablet, Take 2 tablets (1,000 mg total) by mouth 2 (two) times daily., Disp: 120 tablet, Rfl: 5 .  spironolactone (ALDACTONE) 100 MG tablet, take 1 tablet by mouth once daily for blood pressure and SWELLING, Disp: 30 tablet, Rfl: 11  Review of Systems  Constitutional: Positive for fatigue. Negative for appetite change, chills, fever and weight loss.  Respiratory: Negative for chest tightness and shortness of breath.   Cardiovascular: Negative for chest pain and palpitations.  Gastrointestinal: Positive for abdominal pain, bloating and nausea. Negative for anorexia, constipation, diarrhea, dysphagia, hematemesis, hematochezia, jaundice, melena and vomiting.  Genitourinary: Negative for dysuria.  Musculoskeletal: Negative for arthralgias, back pain and myalgias.  Skin: Negative for itching and rash.  Neurological: Negative for dizziness and weakness.    Social History    Tobacco Use  . Smoking status: Former Smoker    Packs/day: 0.75    Years: 22.00    Pack years: 16.50    Types: Cigarettes    Last attempt to quit: 07/27/1985    Years since quitting: 32.6  . Smokeless tobacco: Never Used  . Tobacco comment: smoked as teenager  Substance Use Topics  . Alcohol use: No   Objective:   BP (!) 152/78 (BP Location: Right Arm, Patient Position: Sitting, Cuff Size: Normal)   Pulse 74   Temp 98.1 F (36.7 C) (Oral)   Resp 16   Ht 5' (1.524 m)   Wt 165 lb (74.8 kg)   SpO2 99%   BMI 32.22 kg/m  Vitals:   02/23/18 0916  BP: (!) 152/78  Pulse: 74  Resp: 16  Temp: 98.1 F (36.7 C)  TempSrc: Oral  SpO2: 99%  Weight: 165 lb (74.8 kg)  Height: 5' (1.524 m)     Physical Exam  General Appearance:    Alert, cooperative, no distress  Eyes:    PERRL, conjunctiva/corneas clear, EOM's intact       Lungs:     Clear to auscultation bilaterally, respirations unlabored  Heart:    Regular rate and rhythm  Abdomen:   Mild epigastric tenderness. No rebound or guarding. No masses.         Assessment & Plan:     1. Generalized abdominal pain Patient Instructions   Reduce metformin to ONE tablet twice a day for the time being since metformin can upset your stomach.   - Amylase - CBC with Differential/Platelet - Comprehensive metabolic panel - H. pylori breath test  2. Bloating Consider abdominal u/s if labs are normal.        [name], MD  [facility]

## 2018-02-23 ENCOUNTER — Encounter: Payer: Self-pay | Admitting: Family Medicine

## 2018-02-23 ENCOUNTER — Ambulatory Visit (INDEPENDENT_AMBULATORY_CARE_PROVIDER_SITE_OTHER): Payer: Medicare Other | Admitting: Family Medicine

## 2018-02-23 VITALS — BP 152/78 | HR 74 | Temp 98.1°F | Resp 16 | Ht 60.0 in | Wt 165.0 lb

## 2018-02-23 DIAGNOSIS — R140 Abdominal distension (gaseous): Secondary | ICD-10-CM

## 2018-02-23 DIAGNOSIS — E1169 Type 2 diabetes mellitus with other specified complication: Secondary | ICD-10-CM | POA: Diagnosis not present

## 2018-02-23 DIAGNOSIS — R1084 Generalized abdominal pain: Secondary | ICD-10-CM | POA: Diagnosis not present

## 2018-02-23 LAB — POCT UA - MICROALBUMIN: Microalbumin Ur, POC: 20 mg/L

## 2018-02-23 NOTE — Addendum Note (Signed)
Addended by: Julieta Bellini on: 02/23/2018 10:00 AM   Modules accepted: Orders

## 2018-02-23 NOTE — Patient Instructions (Signed)
   Reduce metformin to ONE tablet twice a day for the time being since metformin can upset your stomach.

## 2018-02-24 LAB — CBC WITH DIFFERENTIAL/PLATELET
Basophils Absolute: 0 10*3/uL (ref 0.0–0.2)
Basos: 1 %
EOS (ABSOLUTE): 0.2 10*3/uL (ref 0.0–0.4)
Eos: 2 %
Hematocrit: 37.8 % (ref 34.0–46.6)
Hemoglobin: 11.8 g/dL (ref 11.1–15.9)
Immature Grans (Abs): 0 10*3/uL (ref 0.0–0.1)
Immature Granulocytes: 0 %
Lymphocytes Absolute: 1.9 10*3/uL (ref 0.7–3.1)
Lymphs: 26 %
MCH: 26.2 pg — ABNORMAL LOW (ref 26.6–33.0)
MCHC: 31.2 g/dL — ABNORMAL LOW (ref 31.5–35.7)
MCV: 84 fL (ref 79–97)
Monocytes Absolute: 0.4 10*3/uL (ref 0.1–0.9)
Monocytes: 5 %
Neutrophils Absolute: 4.8 10*3/uL (ref 1.4–7.0)
Neutrophils: 66 %
Platelets: 267 10*3/uL (ref 150–450)
RBC: 4.51 x10E6/uL (ref 3.77–5.28)
RDW: 14.4 % (ref 12.3–15.4)
WBC: 7.2 10*3/uL (ref 3.4–10.8)

## 2018-02-24 LAB — COMPREHENSIVE METABOLIC PANEL
ALT: 23 IU/L (ref 0–32)
AST: 25 IU/L (ref 0–40)
Albumin/Globulin Ratio: 1.4 (ref 1.2–2.2)
Albumin: 4.7 g/dL (ref 3.6–4.8)
Alkaline Phosphatase: 77 IU/L (ref 39–117)
BUN/Creatinine Ratio: 13 (ref 12–28)
BUN: 13 mg/dL (ref 8–27)
Bilirubin Total: <0.2 mg/dL (ref 0.0–1.2)
CO2: 20 mmol/L (ref 20–29)
Calcium: 9.4 mg/dL (ref 8.7–10.3)
Chloride: 102 mmol/L (ref 96–106)
Creatinine, Ser: 0.98 mg/dL (ref 0.57–1.00)
GFR calc Af Amer: 68 mL/min/{1.73_m2} (ref >59)
GFR calc non Af Amer: 59 mL/min/{1.73_m2} — ABNORMAL LOW (ref >59)
Globulin, Total: 3.3 g/dL (ref 1.5–4.5)
Glucose: 194 mg/dL — ABNORMAL HIGH (ref 65–99)
Potassium: 4.6 mmol/L (ref 3.5–5.2)
Sodium: 140 mmol/L (ref 134–144)
Total Protein: 8 g/dL (ref 6.0–8.5)

## 2018-02-24 LAB — AMYLASE: Amylase: 161 U/L — ABNORMAL HIGH (ref 31–124)

## 2018-02-25 ENCOUNTER — Telehealth: Payer: Self-pay | Admitting: *Deleted

## 2018-02-25 DIAGNOSIS — R1084 Generalized abdominal pain: Secondary | ICD-10-CM

## 2018-02-25 DIAGNOSIS — R140 Abdominal distension (gaseous): Secondary | ICD-10-CM

## 2018-02-25 LAB — H. PYLORI BREATH TEST: H pylori Breath Test: NEGATIVE

## 2018-02-25 NOTE — Telephone Encounter (Signed)
-----   Message from Birdie Sons, MD sent at 02/25/2018  7:59 AM EDT ----- H. Pylori test is negative. Mildly elevated pancreatic functions. Need abdominal ultrasound ordered for evaluation of abdominal pain and bloating.

## 2018-02-25 NOTE — Telephone Encounter (Signed)
Patient was notified of results. Expressed understanding. Order for US abdominal is in epic.

## 2018-03-01 DIAGNOSIS — J3089 Other allergic rhinitis: Secondary | ICD-10-CM | POA: Diagnosis not present

## 2018-03-01 DIAGNOSIS — J3081 Allergic rhinitis due to animal (cat) (dog) hair and dander: Secondary | ICD-10-CM | POA: Diagnosis not present

## 2018-03-01 DIAGNOSIS — J301 Allergic rhinitis due to pollen: Secondary | ICD-10-CM | POA: Diagnosis not present

## 2018-03-02 ENCOUNTER — Ambulatory Visit
Admission: RE | Admit: 2018-03-02 | Discharge: 2018-03-02 | Disposition: A | Discharge status: Home / Self Care | Payer: Medicare Other | Source: Ambulatory Visit | Attending: Family Medicine | Admitting: Family Medicine

## 2018-03-02 DIAGNOSIS — R932 Abnormal findings on diagnostic imaging of liver and biliary tract: Secondary | ICD-10-CM | POA: Diagnosis not present

## 2018-03-02 DIAGNOSIS — Z9049 Acquired absence of other specified parts of digestive tract: Secondary | ICD-10-CM | POA: Insufficient documentation

## 2018-03-02 DIAGNOSIS — K760 Fatty (change of) liver, not elsewhere classified: Secondary | ICD-10-CM | POA: Diagnosis not present

## 2018-03-02 DIAGNOSIS — R1084 Generalized abdominal pain: Secondary | ICD-10-CM | POA: Diagnosis not present

## 2018-03-02 DIAGNOSIS — R140 Abdominal distension (gaseous): Secondary | ICD-10-CM | POA: Diagnosis not present

## 2018-03-08 DIAGNOSIS — J301 Allergic rhinitis due to pollen: Secondary | ICD-10-CM | POA: Diagnosis not present

## 2018-03-08 DIAGNOSIS — J3089 Other allergic rhinitis: Secondary | ICD-10-CM | POA: Diagnosis not present

## 2018-03-09 ENCOUNTER — Encounter: Payer: Self-pay | Admitting: Family Medicine

## 2018-03-09 ENCOUNTER — Telehealth: Payer: Self-pay | Admitting: *Deleted

## 2018-03-09 DIAGNOSIS — K760 Fatty (change of) liver, not elsewhere classified: Secondary | ICD-10-CM | POA: Insufficient documentation

## 2018-03-09 DIAGNOSIS — R1084 Generalized abdominal pain: Secondary | ICD-10-CM

## 2018-03-09 NOTE — Telephone Encounter (Signed)
Patient was notified of results. Expressed understanding. Patient states she is still having stomach problems and would like to precede with GI referral. Referral in epic.

## 2018-03-09 NOTE — Telephone Encounter (Signed)
-----   Message from Birdie Sons, MD sent at 03/09/2018  1:17 PM EDT ----- No cause of stomach problems on ultrasound. If doing with lower dose of metformin then no additional evaluation needed, just stay on one tablet twice a day and follow up for diabetes in 3 months. If stomach is not doing better then refer GI for further evaluation.

## 2018-03-15 DIAGNOSIS — J301 Allergic rhinitis due to pollen: Secondary | ICD-10-CM | POA: Diagnosis not present

## 2018-03-15 DIAGNOSIS — J3089 Other allergic rhinitis: Secondary | ICD-10-CM | POA: Diagnosis not present

## 2018-03-22 DIAGNOSIS — J301 Allergic rhinitis due to pollen: Secondary | ICD-10-CM | POA: Diagnosis not present

## 2018-03-22 DIAGNOSIS — J3089 Other allergic rhinitis: Secondary | ICD-10-CM | POA: Diagnosis not present

## 2018-03-29 DIAGNOSIS — J301 Allergic rhinitis due to pollen: Secondary | ICD-10-CM | POA: Diagnosis not present

## 2018-03-29 DIAGNOSIS — J3089 Other allergic rhinitis: Secondary | ICD-10-CM | POA: Diagnosis not present

## 2018-03-30 ENCOUNTER — Other Ambulatory Visit: Payer: Self-pay | Admitting: Family Medicine

## 2018-04-14 DIAGNOSIS — J301 Allergic rhinitis due to pollen: Secondary | ICD-10-CM | POA: Diagnosis not present

## 2018-04-14 DIAGNOSIS — L209 Atopic dermatitis, unspecified: Secondary | ICD-10-CM | POA: Diagnosis not present

## 2018-04-14 DIAGNOSIS — J3089 Other allergic rhinitis: Secondary | ICD-10-CM | POA: Diagnosis not present

## 2018-04-14 DIAGNOSIS — J4530 Mild persistent asthma, uncomplicated: Secondary | ICD-10-CM | POA: Diagnosis not present

## 2018-04-18 ENCOUNTER — Encounter: Payer: Self-pay | Admitting: Family Medicine

## 2018-04-18 ENCOUNTER — Ambulatory Visit (INDEPENDENT_AMBULATORY_CARE_PROVIDER_SITE_OTHER): Payer: Medicare Other | Admitting: Family Medicine

## 2018-04-18 VITALS — BP 150/80 | HR 61 | Temp 97.8°F | Resp 16 | Wt 163.0 lb

## 2018-04-18 DIAGNOSIS — Z23 Encounter for immunization: Secondary | ICD-10-CM

## 2018-04-18 DIAGNOSIS — E1169 Type 2 diabetes mellitus with other specified complication: Secondary | ICD-10-CM | POA: Diagnosis not present

## 2018-04-18 DIAGNOSIS — I10 Essential (primary) hypertension: Secondary | ICD-10-CM

## 2018-04-18 DIAGNOSIS — K760 Fatty (change of) liver, not elsewhere classified: Secondary | ICD-10-CM | POA: Diagnosis not present

## 2018-04-18 LAB — POCT GLYCOSYLATED HEMOGLOBIN (HGB A1C)
Est. average glucose Bld gHb Est-mCnc: 197
Hemoglobin A1C: 8.5 % — AB (ref 4.0–5.6)

## 2018-04-18 MED ORDER — ACCU-CHEK AVIVA PLUS W/DEVICE KIT: PACK | 0 refills | Status: DC

## 2018-04-18 MED ORDER — METFORMIN HCL 500 MG PO TABS: 500.0000 mg | ORAL_TABLET | Freq: Every day | ORAL | 1 refills | Status: DC

## 2018-04-18 MED ORDER — ERTUGLIFLOZIN L-PYROGLUTAMICAC 5 MG PO TABS: 1.0000 | ORAL_TABLET | ORAL | 0 refills | Status: DC

## 2018-04-18 NOTE — Patient Instructions (Signed)
   Reduce metformin to ONE tablet ONCE every day

## 2018-04-18 NOTE — Progress Notes (Signed)
Patient: Sharon Nelson Female    DOB: 02-16-1951   67 y.o.   MRN: [id number] Visit Date: 04/18/2018  Today's Provider: [name], MD   Chief Complaint  Patient presents with  . Diabetes  . Hyperlipidemia  . Hypertension   Subjective:    HPI   Diabetes Mellitus Type II, Follow-up:   Lab Results  Component Value Date   HGBA1C 7.7 (A) 12/15/2017   HGBA1C 8.4 (H) 07/30/2017   HGBA1C 8.0 09/25/2016   Last seen for diabetes 4 months ago. Reducing metformin to 510m twice a day due to GI side effects which have not improved much.  Management since then includes; no changes. Advised to continue to work on weight loss and healthy diet. She reports good compliance with treatment. She is having side effects. (nausea and stomach cramping) Current symptoms include none and have been stable. Home blood sugar records: blood sugars are not being checked  Episodes of hypoglycemia? no   Current Insulin Regimen: none Most Recent Eye Exam: 1 year ago Weight trend: stable Prior visit with dietician: no Current diet: well balanced Current exercise: walking 3-4 days a week Wt Readings from Last 3 Encounters:  04/18/18 163 lb (73.9 kg)  02/23/18 165 lb (74.8 kg)  12/15/17 159 lb (72.1 kg)    ------------------------------------------------------------------------   Hypertension, follow-up:  BP Readings from Last 3 Encounters:  04/18/18 (!) 150/80  02/23/18 (!) 152/78  12/15/17 120/68    She was last seen for hypertension 8 months ago.  BP at that visit was 142/72. Management since that visit includes; no changes.She reports good compliance with treatment. She is not having side effects.  She is exercising. She is not adherent to low salt diet.   Outside blood pressures are not being checked. She is experiencing none.  Patient denies chest pain, chest pressure/discomfort, claudication, dyspnea, exertional chest pressure/discomfort, irregular heart beat,  near-syncope, orthopnea, palpitations, paroxysmal nocturnal dyspnea, syncope and tachypnea.   Cardiovascular risk factors include advanced age (older than 575for men, 662for women), diabetes mellitus, dyslipidemia and hypertension.  Use of agents associated with hypertension: NSAIDS.   ------------------------------------------------------------------------    Lipid/Cholesterol, Follow-up:   Last seen for this 8 months ago.  Management since that visit includes; labs checked, no changes.  Last Lipid Panel:    Component Value Date/Time   CHOL 171 07/30/2017 1125   TRIG 147 07/30/2017 1125   HDL 46 07/30/2017 1125   CHOLHDL 3.7 07/30/2017 1125   LDLCALC 96 07/30/2017 1125    She reports good compliance with treatment. She is not having side effects.   Wt Readings from Last 3 Encounters:  04/18/18 163 lb (73.9 kg)  02/23/18 165 lb (74.8 kg)  12/15/17 159 lb (72.1 kg)    ------------------------------------------------------------------------    Allergies  Allergen Reactions  . Lisinopril Anaphylaxis and Swelling    WAS ON LIFE SUPPORT   . Iodinated Diagnostic Agents Rash    IV dye, red dye Other Reaction: feeling of heat  . Cinnamon Swelling    Other reaction(s): Swollen lips  . Peanut Oil     Other reaction(s): Swelling  . Penicillins Swelling    Other reaction(s): Swelling  . Sulfa Antibiotics Swelling    Other reaction(s): Swelling  . Verapamil Hcl Er     Swelling  . Milk Protein Rash     Current Outpatient Medications:  .  ACCU-CHEK AVIVA PLUS test strip, TEST once daily, Disp: 100 each, Rfl:  4 .  albuterol (PROAIR HFA) 108 (90 BASE) MCG/ACT inhaler, Inhale into the lungs every 4 (four) hours as needed. , Disp: , Rfl:  .  aspirin 81 MG tablet, Take 81 mg by mouth daily. , Disp: , Rfl:  .  BIOTIN PO, Take by mouth daily. Reported on 02/05/2016, Disp: , Rfl:  .  budesonide-formoterol (SYMBICORT) 160-4.5 MCG/ACT inhaler, Inhale into the lungs as needed. ,  Disp: , Rfl:  .  Cholecalciferol 2000 UNITS CAPS, Take by mouth daily. , Disp: , Rfl:  .  Cyanocobalamin (VITAMIN B 12 PO), Take by mouth daily., Disp: , Rfl:  .  EPINEPHrine 0.3 mg/0.3 mL IJ SOAJ injection, , Disp: , Rfl:  .  estradiol (ESTRACE) 0.5 MG tablet, TAKE 1 TABLET BY MOUTH ONCE DAILY, Disp: 30 tablet, Rfl: 1 .  gabapentin (NEURONTIN) 600 MG tablet, take 1 tablet by mouth three times a day, Disp: 90 tablet, Rfl: 4 .  levocetirizine (XYZAL) 5 MG tablet, Take 5 mg by mouth every evening. , Disp: , Rfl:  .  lovastatin (MEVACOR) 40 MG tablet, Take 1 tablet by mouth at bedtime, Disp: , Rfl:  .  metFORMIN (GLUCOPHAGE) 500 MG tablet, Take 2 tablets (1,000 mg total) by mouth 2 (two) times daily., Disp: 120 tablet, Rfl: 5 .  spironolactone (ALDACTONE) 100 MG tablet, take 1 tablet by mouth once daily for blood pressure and SWELLING, Disp: 30 tablet, Rfl: 11  Review of Systems  Constitutional: Negative for appetite change, chills, fatigue and fever.  Respiratory: Negative for chest tightness and shortness of breath.   Cardiovascular: Negative for chest pain and palpitations.  Gastrointestinal: Negative for abdominal pain, nausea and vomiting.  Musculoskeletal: Positive for joint swelling (right knee).  Neurological: Negative for dizziness and weakness.    Social History   Tobacco Use  . Smoking status: Former Smoker    Packs/day: 0.75    Years: 22.00    Pack years: 16.50    Types: Cigarettes    Last attempt to quit: 07/27/1985    Years since quitting: 32.7  . Smokeless tobacco: Never Used  . Tobacco comment: smoked as teenager  Substance Use Topics  . Alcohol use: No   Objective:    Vitals:   04/18/18 0933 04/18/18 0935  BP: (!) 151/74 (!) 150/80  Pulse: 61   Resp: 16   Temp: 97.8 F (36.6 C)   TempSrc: Oral   SpO2: 98%   Weight: 163 lb (73.9 kg)      Physical Exam   General Appearance:    Alert, cooperative, no distress  Eyes:    PERRL, conjunctiva/corneas clear,  EOM's intact       Lungs:     Clear to auscultation bilaterally, respirations unlabored  Heart:    Regular rate and rhythm  Neurologic:   Awake, alert, oriented x 3. No apparent focal neurological           defect.       Results for orders placed or performed in visit on 04/18/18  POCT HgB A1C  Result Value Ref Range   Hemoglobin A1C 8.5 (A) 4.0 - 5.6 %   HbA1c POC (<> result, manual entry)     HbA1c, POC (prediabetic range)     HbA1c, POC (controlled diabetic range)     Est. average glucose Bld gHb Est-mCnc 197        Assessment & Plan:     1. Type 2 diabetes mellitus with other specified complication, without long-term current  use of insulin ([place]) Uncontrolled and having some GI side effects from metformin. She is scheduled to follow up with GI. Will reduce metformin to 1 tablet daily and add low dose-- Ertugliflozin L-PyroglutamicAc (STEGLATRO) 5 MG TABS; Take 1 tablet by mouth every morning.  Dispense: 42 tablet; Refill: 0  - Blood Glucose Monitoring Suppl (ACCU-CHEK AVIVA PLUS) w/Device KIT; Use to check blood sugar for type 2 diabetes.  Dispense: 1 kit; Refill: 0  2. Fatty liver Stable LFTs.   3. Essential (primary) hypertension BP up today, she has not been exercising as much as usual which she intends to improve. Expect some improvement with BP with addition of ertuglifozin.   4. Need for influenza vaccination  - Flu vaccine HIGH DOSE PF (Fluzone High dose)  Return in about 6 weeks (around 05/30/2018).       [name], MD  [facility]

## 2018-04-19 DIAGNOSIS — J301 Allergic rhinitis due to pollen: Secondary | ICD-10-CM | POA: Diagnosis not present

## 2018-04-19 DIAGNOSIS — J3089 Other allergic rhinitis: Secondary | ICD-10-CM | POA: Diagnosis not present

## 2018-04-26 ENCOUNTER — Ambulatory Visit (INDEPENDENT_AMBULATORY_CARE_PROVIDER_SITE_OTHER): Payer: Medicare Other | Admitting: Gastroenterology

## 2018-04-26 ENCOUNTER — Encounter: Payer: Self-pay | Admitting: Gastroenterology

## 2018-04-26 ENCOUNTER — Other Ambulatory Visit: Payer: Self-pay

## 2018-04-26 VITALS — BP 138/71 | HR 62 | Ht 60.0 in | Wt 159.4 lb

## 2018-04-26 DIAGNOSIS — R140 Abdominal distension (gaseous): Secondary | ICD-10-CM

## 2018-04-26 DIAGNOSIS — Z1211 Encounter for screening for malignant neoplasm of colon: Secondary | ICD-10-CM

## 2018-04-26 DIAGNOSIS — K529 Noninfective gastroenteritis and colitis, unspecified: Secondary | ICD-10-CM
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
## 2018-04-28 ENCOUNTER — Encounter: Payer: Self-pay | Admitting: *Deleted

## 2018-04-28 ENCOUNTER — Telehealth: Payer: Self-pay | Admitting: Family Medicine

## 2018-04-28 ENCOUNTER — Other Ambulatory Visit: Payer: Self-pay

## 2018-04-28 DIAGNOSIS — J3081 Allergic rhinitis due to animal (cat) (dog) hair and dander: Secondary | ICD-10-CM | POA: Diagnosis not present

## 2018-04-28 DIAGNOSIS — J3089 Other allergic rhinitis: Secondary | ICD-10-CM | POA: Diagnosis not present

## 2018-04-28 DIAGNOSIS — J301 Allergic rhinitis due to pollen: Secondary | ICD-10-CM | POA: Diagnosis not present

## 2018-04-28 NOTE — Telephone Encounter (Signed)
Pt's BS meter was not called in at the pharmacy. Pt states Dr. Caryn Section told her he would have new one called in since her old one stopped working.  Please advise.  Thanks, [employer]

## 2018-04-29 ENCOUNTER — Other Ambulatory Visit (HOSPITAL_COMMUNITY)
Admission: RE | Admit: 2018-04-29 | Discharge: 2018-04-29 | Disposition: A | Discharge status: Home / Self Care | Payer: Medicare Other | Source: Ambulatory Visit | Attending: Obstetrics & Gynecology | Admitting: Obstetrics & Gynecology

## 2018-04-29 ENCOUNTER — Encounter: Payer: Self-pay | Admitting: Obstetrics & Gynecology

## 2018-04-29 ENCOUNTER — Ambulatory Visit (INDEPENDENT_AMBULATORY_CARE_PROVIDER_SITE_OTHER): Payer: Medicare Other | Admitting: Obstetrics & Gynecology

## 2018-04-29 VITALS — BP 130/80 | Ht 60.0 in | Wt 158.0 lb

## 2018-04-29 DIAGNOSIS — Z1211 Encounter for screening for malignant neoplasm of colon: Secondary | ICD-10-CM

## 2018-04-29 DIAGNOSIS — Z124 Encounter for screening for malignant neoplasm of cervix: Secondary | ICD-10-CM | POA: Diagnosis not present

## 2018-04-29 DIAGNOSIS — Z1272 Encounter for screening for malignant neoplasm of vagina: Secondary | ICD-10-CM | POA: Diagnosis not present

## 2018-04-29 DIAGNOSIS — Z1239 Encounter for other screening for malignant neoplasm of breast: Secondary | ICD-10-CM

## 2018-04-29 DIAGNOSIS — B3731 Acute candidiasis of vulva and vagina: Secondary | ICD-10-CM

## 2018-04-29 DIAGNOSIS — B373 Candidiasis of vulva and vagina: Secondary | ICD-10-CM | POA: Diagnosis not present

## 2018-04-29 DIAGNOSIS — Z0000 Encounter for general adult medical examination without abnormal findings: Secondary | ICD-10-CM

## 2018-04-29 MED ORDER — FLUCONAZOLE 150 MG PO TABS: 150.0000 mg | ORAL_TABLET | Freq: Once | ORAL | 1 refills | Status: AC
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
## 2018-04-29 NOTE — Telephone Encounter (Signed)
Per pharmacy meter is ready to pick up. Patient was advised.

## 2018-04-29 NOTE — Telephone Encounter (Signed)
accu-check aviva plus was ordered and sent to [facility] on 04-18-2018

## 2018-04-29 NOTE — Patient Instructions (Addendum)
PAP every 5 years Mammogram every other year, due 2020    Call [phone] to schedule at [facility] Colonoscopy every 10 years Labs yearly (with PCP)  Fluconazole tablets What is this medicine? FLUCONAZOLE (floo KON na zole) is an antifungal medicine. It is used to treat certain kinds of fungal or yeast infections. This medicine may be used for other purposes; ask your health care provider or pharmacist if you have questions. COMMON BRAND NAME(S): Diflucan What should I tell my health care provider before I take this medicine? They need to know if you have any of these conditions: -history of irregular heart beat -kidney disease -an unusual or allergic reaction to fluconazole, other azole antifungals, medicines, foods, dyes, or preservatives -pregnant or trying to get pregnant -breast-feeding How should I use this medicine? Take this medicine by mouth. Follow the directions on the prescription label. Do not take your medicine more often than directed. Talk to your pediatrician regarding the use of this medicine in children. Special care may be needed. This medicine has been used in children as young as 68 months of age. Overdosage: If you think you have taken too much of this medicine contact a poison control center or emergency room at once. NOTE: This medicine is only for you. Do not share this medicine with others. What if I miss a dose? If you miss a dose, take it as soon as you can. If it is almost time for your next dose, take only that dose. Do not take double or extra doses. What may interact with this medicine? Do not take this medicine with any of the following medications: -astemizole -certain medicines for irregular heart beat like dofetilide, dronedarone, quinidine -cisapride -erythromycin -lomitapide -other medicines that prolong the QT interval (cause an abnormal heart rhythm) -pimozide -terfenadine -thioridazine -tolvaptan -ziprasidone This medicine may also  interact with the following medications: -antiviral medicines for HIV or AIDS -birth control pills -certain antibiotics like rifabutin, rifampin -certain medicines for blood pressure like amlodipine, isradipine, felodipine, hydrochlorothiazide, losartan, nifedipine -certain medicines for cancer like cyclophosphamide, vinblastine, vincristine -certain medicines for cholesterol like atorvastatin, lovastatin, fluvastatin, simvastatin -certain medicines for depression, anxiety, or psychotic disturbances like amitriptyline, midazolam, nortriptyline, triazolam -certain medicines for diabetes like glipizide, glyburide, tolbutamide -certain medicines for pain like alfentanil, fentanyl, methadone -certain medicines for seizures like carbamazepine, phenytoin -certain medicines that treat or prevent blood clots like warfarin -halofantrine -medicines that lower your chance of fighting infection like cyclosporine, prednisone, tacrolimus -NSAIDS, medicines for pain and inflammation, like celecoxib, diclofenac, flurbiprofen, ibuprofen, meloxicam, naproxen -other medicines for fungal infections -sirolimus -theophylline -tofacitinib This list may not describe all possible interactions. Give your health care provider a list of all the medicines, herbs, non-prescription drugs, or dietary supplements you use. Also tell them if you smoke, drink alcohol, or use illegal drugs. Some items may interact with your medicine. What should I watch for while using this medicine? Visit your doctor or health care professional for regular checkups. If you are taking this medicine for a long time you may need blood work. Tell your doctor if your symptoms do not improve. Some fungal infections need many weeks or months of treatment to cure. Alcohol can increase possible damage to your liver. Avoid alcoholic drinks. If you have a vaginal infection, do not have sex until you have finished your treatment. You can wear a sanitary  napkin. Do not use tampons. Wear freshly washed cotton, not synthetic, panties. What side effects may I notice from receiving this medicine?  Side effects that you should report to your doctor or health care professional as soon as possible: -allergic reactions like skin rash or itching, hives, swelling of the lips, mouth, tongue, or throat -dark urine -feeling dizzy or faint -irregular heartbeat or chest pain -redness, blistering, peeling or loosening of the skin, including inside the mouth -trouble breathing -unusual bruising or bleeding -vomiting -yellowing of the eyes or skin Side effects that usually do not require medical attention (report to your doctor or health care professional if they continue or are bothersome): -changes in how food tastes -diarrhea -headache -stomach upset or nausea This list may not describe all possible side effects. Call your doctor for medical advice about side effects. You may report side effects to FDA at 1-800-FDA-1088. Where should I keep my medicine? Keep out of the reach of children. Store at room temperature below 30 degrees C (86 degrees F). Throw away any medicine after the expiration date. NOTE: This sheet is a summary. It may not cover all possible information. If you have questions about this medicine, talk to your doctor, pharmacist, or health care provider.  2018 Elsevier/[employer] Standard (2013-02-18 19:37:38)

## 2018-04-29 NOTE — Telephone Encounter (Signed)
Please review. Ok to order glucometer?

## 2018-05-02 LAB — CYTOLOGY - PAP
Bacterial vaginitis: NEGATIVE
Candida vaginitis: POSITIVE — AB
Diagnosis: NEGATIVE
HPV: NOT DETECTED

## 2018-05-03 NOTE — Discharge Instructions (Signed)
General Anesthesia, Adult, Care After °These instructions provide you with information about caring for yourself after your procedure. Your health care provider may also give you more specific instructions. Your treatment has been planned according to current medical practices, but problems sometimes occur. Call your health care provider if you have any problems or questions after your procedure. °What can I expect after the procedure? °After the procedure, it is common to have: °· Vomiting. °· A sore throat. °· Mental slowness. ° °It is common to feel: °· Nauseous. °· Cold or shivery. °· Sleepy. °· Tired. °· Sore or achy, even in parts of your body where you did not have surgery. ° °Follow these instructions at home: °For at least 24 hours after the procedure: °· Do not: °? Participate in activities where you could fall or become injured. °? Drive. °? Use heavy machinery. °? Drink alcohol. °? Take sleeping pills or medicines that cause drowsiness. °? Make important decisions or sign legal documents. °? Take care of children on your own. °· Rest. °Eating and drinking °· If you vomit, drink water, juice, or soup when you can drink without vomiting. °· Drink enough fluid to keep your urine clear or pale yellow. °· Make sure you have little or no nausea before eating solid foods. °· Follow the diet recommended by your health care provider. °General instructions °· Have a responsible adult stay with you until you are awake and alert. °· Return to your normal activities as told by your health care provider. Ask your health care provider what activities are safe for you. °· Take over-the-counter and prescription medicines only as told by your health care provider. °· If you smoke, do not smoke without supervision. °· Keep all follow-up visits as told by your health care provider. This is important. °Contact a health care provider if: °· You continue to have nausea or vomiting at home, and medicines are not helpful. °· You  cannot drink fluids or start eating again. °· You cannot urinate after 8-12 hours. °· You develop a skin rash. °· You have fever. °· You have increasing redness at the site of your procedure. °Get help right away if: °· You have difficulty breathing. °· You have chest pain. °· You have unexpected bleeding. °· You feel that you are having a life-threatening or urgent problem. °This information is not intended to replace advice given to you by your health care provider. Make sure you discuss any questions you have with your health care provider. °Document Released: 10/19/2000 Document Revised: 12/16/2015 Document Reviewed: 06/27/2015 °Elsevier Interactive Patient Education © 2018 [employer] ° °

## 2018-05-04 ENCOUNTER — Ambulatory Visit: Payer: Medicare Other | Admitting: Anesthesiology

## 2018-05-04 ENCOUNTER — Encounter
Admission: RE | Disposition: A | Discharge status: Home / Self Care | Payer: Self-pay | Source: Ambulatory Visit | Attending: Gastroenterology

## 2018-05-04 ENCOUNTER — Ambulatory Visit
Admission: RE | Admit: 2018-05-04 | Discharge: 2018-05-04 | Disposition: A | Discharge status: Home / Self Care | Payer: Medicare Other | Source: Ambulatory Visit | Attending: Gastroenterology | Admitting: Gastroenterology

## 2018-05-04 DIAGNOSIS — Z7982 Long term (current) use of aspirin: Secondary | ICD-10-CM | POA: Diagnosis not present

## 2018-05-04 DIAGNOSIS — Z79899 Other long term (current) drug therapy: Secondary | ICD-10-CM | POA: Insufficient documentation

## 2018-05-04 DIAGNOSIS — K449 Diaphragmatic hernia without obstruction or gangrene: Secondary | ICD-10-CM | POA: Insufficient documentation

## 2018-05-04 DIAGNOSIS — K2950 Unspecified chronic gastritis without bleeding: Secondary | ICD-10-CM | POA: Insufficient documentation

## 2018-05-04 DIAGNOSIS — E119 Type 2 diabetes mellitus without complications: Secondary | ICD-10-CM | POA: Diagnosis not present

## 2018-05-04 DIAGNOSIS — R1013 Epigastric pain: Secondary | ICD-10-CM | POA: Diagnosis not present

## 2018-05-04 DIAGNOSIS — K621 Rectal polyp: Secondary | ICD-10-CM | POA: Diagnosis not present

## 2018-05-04 DIAGNOSIS — Z86010 Personal history of colonic polyps: Secondary | ICD-10-CM | POA: Diagnosis not present

## 2018-05-04 DIAGNOSIS — D120 Benign neoplasm of cecum: Secondary | ICD-10-CM

## 2018-05-04 DIAGNOSIS — D123 Benign neoplasm of transverse colon: Secondary | ICD-10-CM | POA: Diagnosis not present

## 2018-05-04 DIAGNOSIS — Z87891 Personal history of nicotine dependence: Secondary | ICD-10-CM | POA: Insufficient documentation

## 2018-05-04 DIAGNOSIS — D124 Benign neoplasm of descending colon: Secondary | ICD-10-CM | POA: Insufficient documentation

## 2018-05-04 DIAGNOSIS — Z7984 Long term (current) use of oral hypoglycemic drugs: Secondary | ICD-10-CM | POA: Diagnosis not present

## 2018-05-04 DIAGNOSIS — Z1211 Encounter for screening for malignant neoplasm of colon: Secondary | ICD-10-CM | POA: Diagnosis not present

## 2018-05-04 DIAGNOSIS — K635 Polyp of colon: Secondary | ICD-10-CM | POA: Diagnosis not present

## 2018-05-04 DIAGNOSIS — J45909 Unspecified asthma, uncomplicated: Secondary | ICD-10-CM | POA: Insufficient documentation

## 2018-05-04 DIAGNOSIS — I10 Essential (primary) hypertension: Secondary | ICD-10-CM | POA: Insufficient documentation

## 2018-05-04 DIAGNOSIS — K2930 Chronic superficial gastritis without bleeding: Secondary | ICD-10-CM | POA: Diagnosis not present

## 2018-05-04 HISTORY — PX: POLYPECTOMY: SHX5525

## 2018-05-04 HISTORY — DX: Cerebral aneurysm, nonruptured: I67.1

## 2018-05-04 HISTORY — PX: BIOPSY: SHX5522

## 2018-05-04 HISTORY — PX: ESOPHAGOGASTRODUODENOSCOPY (EGD) WITH PROPOFOL: SHX5813

## 2018-05-04 HISTORY — PX: COLONOSCOPY WITH PROPOFOL: SHX5780

## 2018-05-04 LAB — GLUCOSE, CAPILLARY
Glucose-Capillary: 119 mg/dL — ABNORMAL HIGH (ref 70–99)
Glucose-Capillary: 112 mg/dL — ABNORMAL HIGH (ref 70–99)

## 2018-05-04 SURGERY — COLONOSCOPY WITH PROPOFOL
Anesthesia: General | Site: Throat

## 2018-05-04 MED ORDER — PROPOFOL 10 MG/ML IV BOLUS
INTRAVENOUS | Status: DC | PRN
  Administered 2018-05-04 (×2): 20 mg via INTRAVENOUS
  Administered 2018-05-04: 30 mg via INTRAVENOUS
  Administered 2018-05-04: 20 mg via INTRAVENOUS
  Administered 2018-05-04 (×2): 30 mg via INTRAVENOUS
  Administered 2018-05-04: 20 mg via INTRAVENOUS
  Administered 2018-05-04: 10 mg via INTRAVENOUS
  Administered 2018-05-04: 50 mg via INTRAVENOUS
  Administered 2018-05-04 (×2): 20 mg via INTRAVENOUS
  Administered 2018-05-04: 50 mg via INTRAVENOUS
  Administered 2018-05-04 (×2): 20 mg via INTRAVENOUS
  Administered 2018-05-04: 100 mg via INTRAVENOUS

## 2018-05-04 MED ORDER — LACTATED RINGERS IV SOLN
INTRAVENOUS | Status: DC
  Administered 2018-05-04: 08:00:00 via INTRAVENOUS

## 2018-05-04 MED ORDER — STERILE WATER FOR IRRIGATION IR SOLN
Status: DC | PRN
  Administered 2018-05-04: 09:00:00

## 2018-05-04 MED ORDER — LIDOCAINE HCL (CARDIAC) PF 100 MG/5ML IV SOSY
PREFILLED_SYRINGE | INTRAVENOUS | Status: DC | PRN
  Administered 2018-05-04: 40 mg via INTRAVENOUS

## 2018-05-04 SURGICAL SUPPLY — 11 items
BLOCK BITE 60FR ADLT L/F GRN (MISCELLANEOUS) ×3 IMPLANT
CANISTER SUCT 1200ML W/VALVE (MISCELLANEOUS) ×3 IMPLANT
COLOWRAP REGULAR (MISCELLANEOUS) ×3
COMPRESSION COLOWRAP REGULAR (MISCELLANEOUS) ×2 IMPLANT
FORCEPS BIOP RAD 4 LRG CAP 4 (CUTTING FORCEPS) ×3 IMPLANT
GOWN CVR UNV OPN BCK APRN NK (MISCELLANEOUS) ×4 IMPLANT
GOWN ISOL THUMB LOOP REG UNIV (MISCELLANEOUS) ×2
KIT ENDO PROCEDURE OLY (KITS) ×3 IMPLANT
SNARE COLD EXACTO (MISCELLANEOUS) ×3 IMPLANT
TRAP ETRAP POLY (MISCELLANEOUS) ×3 IMPLANT
WATER STERILE IRR 250ML POUR (IV SOLUTION) ×3 IMPLANT

## 2018-05-04 NOTE — Op Note (Signed)
[facility] Gastroenterology Patient Name: Sharon Nelson Procedure Date: 05/04/2018 9:06 AM MRN: [id number] Account #: [id number] Date of Birth: 1950-11-07 Admit Type: Outpatient Age: 67 Room: [facility] OR ROOM 01 Gender: Female Note Status: Finalized Procedure:            Upper GI endoscopy Indications:          Dyspepsia Providers:            Lin Landsman MD, MD Referring MD:         Kirstie Peri. Caryn Section, MD (Referring MD) Medicines:            Monitored Anesthesia Care Complications:        No immediate complications. Estimated blood loss: None. Procedure:            Pre-Anesthesia Assessment:                       - Prior to the procedure, a History and Physical was                        performed, and patient medications and allergies were                        reviewed. The patient is competent. The risks and                        benefits of the procedure and the sedation options and                        risks were discussed with the patient. All questions                        were answered and informed consent was obtained.                        Patient identification and proposed procedure were                        verified by the physician, the nurse, the                        anesthesiologist, the anesthetist and the technician in                        the pre-procedure area in the procedure room in the                        endoscopy suite. Mental Status Examination: alert and                        oriented. Airway Examination: normal oropharyngeal                        airway and neck mobility. Respiratory Examination:                        clear to auscultation. CV Examination: normal.                        Prophylactic Antibiotics: The patient does not require  prophylactic antibiotics. Prior Anticoagulants: The                        patient has taken no previous anticoagulant or                        antiplatelet  agents. ASA Grade Assessment: II - A                        patient with mild systemic disease. After reviewing the                        risks and benefits, the patient was deemed in                        satisfactory condition to undergo the procedure. The                        anesthesia plan was to use monitored anesthesia care                        (MAC). Immediately prior to administration of                        medications, the patient was re-assessed for adequacy                        to receive sedatives. The heart rate, respiratory rate,                        oxygen saturations, blood pressure, adequacy of                        pulmonary ventilation, and response to care were                        monitored throughout the procedure. The physical status                        of the patient was re-assessed after the procedure.                       After obtaining informed consent, the endoscope was                        passed under direct vision. Throughout the procedure,                        the patient's blood pressure, pulse, and oxygen                        saturations were monitored continuously. The was                        introduced through the mouth, and advanced to the                        second part of duodenum. The upper GI endoscopy was  accomplished without difficulty. The patient tolerated                        the procedure well. Findings:      The duodenal bulb and second portion of the duodenum were normal.      A small hiatal hernia was present.      The entire examined stomach was normal. Biopsies were taken with a cold       forceps for Helicobacter pylori testing.      The cardia and gastric fundus were normal on retroflexion.      Esophagogastric landmarks were identified: the gastroesophageal junction       was found at 35 cm from the incisors.      The gastroesophageal junction and examined esophagus were  normal. Impression:           - Normal duodenal bulb and second portion of the                        duodenum.                       - Small hiatal hernia.                       - Normal stomach. Biopsied.                       - Esophagogastric landmarks identified.                       - Normal gastroesophageal junction and esophagus. Recommendation:       - Await pathology results.                       - Proceed with colonoscopy as scheduled                       See colonoscopy report Procedure Code(s):    --- Professional ---                       [phone], Esophagogastroduodenoscopy, flexible, transoral;                        with biopsy, single or multiple Diagnosis Code(s):    --- Professional ---                       K44.9, Diaphragmatic hernia without obstruction or                        gangrene                       R10.13, Epigastric pain CPT copyright 2018 American Medical Association. All rights reserved. The codes documented in this report are preliminary and upon coder review may  be revised to meet current compliance requirements. Dr. Ulyess Mort Lin Landsman MD, MD 05/04/2018 9:24:12 AM This report has been signed electronically. Number of Addenda: 0 Note Initiated On: 05/04/2018 9:06 AM      [facility]

## 2018-05-04 NOTE — Anesthesia Preprocedure Evaluation (Signed)
Anesthesia Evaluation  Patient identified by MRN, date of birth, ID band Patient awake    Reviewed: Allergy & Precautions, H&P , NPO status , Patient's Chart, lab work & pertinent test results  Airway Mallampati: II  TM Distance: >3 FB Neck ROM: full    Dental no notable dental hx.    Pulmonary asthma , former smoker,    Pulmonary exam normal breath sounds clear to auscultation       Cardiovascular hypertension, On Medications Normal cardiovascular exam     Neuro/Psych  Headaches,    GI/Hepatic negative GI ROS, Neg liver ROS,   Endo/Other  diabetes, Well Controlled, Type 2  Renal/GU negative Renal ROS     Musculoskeletal   Abdominal   Peds  Hematology negative hematology ROS (+)   Anesthesia Other Findings   Reproductive/Obstetrics                             Anesthesia Physical Anesthesia Plan  ASA: II  Anesthesia Plan: General   Post-op Pain Management:    Induction:   PONV Risk Score and Plan:   Airway Management Planned:   Additional Equipment:   Intra-op Plan:   Post-operative Plan:   Informed Consent: I have reviewed the patients History and Physical, chart, labs and discussed the procedure including the risks, benefits and alternatives for the proposed anesthesia with the patient or authorized representative who has indicated his/her understanding and acceptance.     Plan Discussed with:   Anesthesia Plan Comments:         Anesthesia Quick Evaluation

## 2018-05-04 NOTE — Op Note (Addendum)
[facility] Gastroenterology Patient Name: Sharon Nelson Procedure Date: 05/04/2018 9:05 AM MRN: [id number] Account #: [id number] Date of Birth: 1950/10/06 Admit Type: Outpatient Age: 67 Room: [facility] OR ROOM 01 Gender: Female Note Status: Finalized Procedure:            Colonoscopy Indications:          Surveillance: Personal history of adenomatous polyps on                        last colonoscopy > 5 years ago, Last colonoscopy:                        December 2007 Providers:            Lin Landsman MD, MD Medicines:            Monitored Anesthesia Care Complications:        No immediate complications. Estimated blood loss: None. Procedure:            Pre-Anesthesia Assessment:                       - Prior to the procedure, a History and Physical was                        performed, and patient medications and allergies were                        reviewed. The patient is competent. The risks and                        benefits of the procedure and the sedation options and                        risks were discussed with the patient. All questions                        were answered and informed consent was obtained.                        Patient identification and proposed procedure were                        verified by the physician, the nurse, the                        anesthesiologist, the anesthetist and the technician in                        the pre-procedure area in the procedure room in the                        endoscopy suite. Mental Status Examination: alert and                        oriented. Airway Examination: normal oropharyngeal                        airway and neck mobility. Respiratory Examination:  clear to auscultation. CV Examination: normal.                        Prophylactic Antibiotics: The patient does not require                        prophylactic antibiotics. Prior Anticoagulants: The          patient has taken no previous anticoagulant or                        antiplatelet agents. ASA Grade Assessment: II - A                        patient with mild systemic disease. After reviewing the                        risks and benefits, the patient was deemed in                        satisfactory condition to undergo the procedure. The                        anesthesia plan was to use monitored anesthesia care                        (MAC). Immediately prior to administration of                        medications, the patient was re-assessed for adequacy                        to receive sedatives. The heart rate, respiratory rate,                        oxygen saturations, blood pressure, adequacy of                        pulmonary ventilation, and response to care were                        monitored throughout the procedure. The physical status                        of the patient was re-assessed after the procedure.                       After obtaining informed consent, the colonoscope was                        passed under direct vision. Throughout the procedure,                        the patient's blood pressure, pulse, and oxygen                        saturations were monitored continuously. The was                        introduced through the anus and advanced to the the  cecum, identified by appendiceal orifice and ileocecal                        valve. The colonoscopy was performed without                        difficulty. The patient tolerated the procedure well.                        The quality of the bowel preparation was evaluated                        using the BBPS [facility] Bowel Preparation Scale) with                        scores of: Right Colon = 3, Transverse Colon = 3 and                        Left Colon = 3 (entire mucosa seen well with no                        residual staining, small fragments of stool or opaque                         liquid). The total BBPS score equals 9. Findings:      The perianal and digital rectal examinations were normal. Pertinent       negatives include normal sphincter tone and no palpable rectal lesions.      A diminutive polyp was found in the cecum. The polyp was sessile. The       polyp was removed with a cold biopsy forceps. Resection and retrieval       were complete.      Six sessile polyps were found in the rectum, 1, descending colon, 4, and       transverse colon, 1. The polyps were 4 to 5 mm in size. These polyps       were removed with a cold snare. Resection and retrieval were complete.      The retroflexed view of the distal rectum and anal verge was normal and       showed no anal or rectal abnormalities.      Normal mucosa was found in the entire colon. Impression:           - One diminutive polyp in the cecum, removed with a                        cold biopsy forceps. Resected and retrieved.                       - Six 4 to 5 mm polyps in the rectum, in the descending                        colon and in the transverse colon, removed with a cold                        snare. Resected and retrieved.                       - The distal rectum  and anal verge are normal on                        retroflexion view. Recommendation:       - Discharge patient to home (with escort).                       - Resume previous diet today.                       - Continue present medications.                       - Await pathology results.                       - Repeat colonoscopy in 3 - 5 years for surveillance of                        multiple polyps.                       - Return to my office as previously scheduled. Procedure Code(s):    --- Professional ---                       [id number], Colonoscopy, flexible; with removal of tumor(s),                        polyp(s), or other lesion(s) by snare technique                       45380, 76, Colonoscopy, flexible; with  biopsy, single                        or multiple Diagnosis Code(s):    --- Professional ---                       Z86.010, Personal history of colonic polyps                       D12.0, Benign neoplasm of cecum                       K62.1, Rectal polyp                       D12.4, Benign neoplasm of descending colon                       D12.3, Benign neoplasm of transverse colon (hepatic                        flexure or splenic flexure) CPT copyright 2018 American Medical Association. All rights reserved. The codes documented in this report are preliminary and upon coder review may  be revised to meet current compliance requirements. Dr. Ulyess Mort Lin Landsman MD, MD 05/04/2018 10:01:19 AM This report has been signed electronically. Number of Addenda: 0 Note Initiated On: 05/04/2018 9:05 AM Scope Withdrawal Time: 0 hours 25 minutes 19 seconds  Total Procedure Duration: 0 hours 28 minutes 43 seconds       [facility]

## 2018-05-04 NOTE — Transfer of Care (Signed)
Immediate Anesthesia Transfer of Care Note  Patient: Sharon Nelson  Procedure(s) Performed: COLONOSCOPY WITH PROPOFOL (N/A Rectum) ESOPHAGOGASTRODUODENOSCOPY (EGD) WITH PROPOFOL (N/A Throat) BIOPSY (N/A Throat) POLYPECTOMY (N/A Rectum)  Patient Location: PACU  Anesthesia Type: General  Level of Consciousness: awake, alert  and patient cooperative  Airway and Oxygen Therapy: Patient Spontanous Breathing and Patient connected to supplemental oxygen  Post-op Assessment: Post-op Vital signs reviewed, Patient's Cardiovascular Status Stable, Respiratory Function Stable, Patent Airway and No signs of Nausea or vomiting  Post-op Vital Signs: Reviewed and stable  Complications: No apparent anesthesia complications

## 2018-05-04 NOTE — Anesthesia Procedure Notes (Signed)
Performed by: Cola Gane, CRNA Pre-anesthesia Checklist: Patient identified, Emergency Drugs available, Suction available, Timeout performed and Patient being monitored Patient Re-evaluated:Patient Re-evaluated prior to induction Oxygen Delivery Method: Nasal cannula Placement Confirmation: positive ETCO2       
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
## 2018-05-04 NOTE — Anesthesia Postprocedure Evaluation (Signed)
Anesthesia Post Note  Patient: Sharon Nelson  Procedure(s) Performed: COLONOSCOPY WITH PROPOFOL (N/A Rectum) ESOPHAGOGASTRODUODENOSCOPY (EGD) WITH PROPOFOL (N/A Throat) BIOPSY (N/A Throat) POLYPECTOMY (N/A Rectum)  Patient location during evaluation: PACU Anesthesia Type: General Level of consciousness: awake and alert Pain management: pain level controlled Vital Signs Assessment: post-procedure vital signs reviewed and stable Respiratory status: spontaneous breathing Cardiovascular status: blood pressure returned to baseline Anesthetic complications: no    Jaci Standard, III,  Arasely Akkerman D

## 2018-05-05 ENCOUNTER — Encounter: Payer: Self-pay | Admitting: Gastroenterology

## 2018-05-06 ENCOUNTER — Encounter: Payer: Self-pay | Admitting: Gastroenterology

## 2018-05-10 ENCOUNTER — Other Ambulatory Visit: Payer: Self-pay | Admitting: Family Medicine

## 2018-05-10 DIAGNOSIS — J3089 Other allergic rhinitis: Secondary | ICD-10-CM | POA: Diagnosis not present

## 2018-05-10 DIAGNOSIS — J301 Allergic rhinitis due to pollen: Secondary | ICD-10-CM | POA: Diagnosis not present

## 2018-05-12 ENCOUNTER — Encounter: Payer: Self-pay | Admitting: Gastroenterology

## 2018-05-30 ENCOUNTER — Ambulatory Visit (INDEPENDENT_AMBULATORY_CARE_PROVIDER_SITE_OTHER): Payer: Medicare Other | Admitting: Family Medicine

## 2018-05-30 ENCOUNTER — Encounter: Payer: Self-pay | Admitting: Family Medicine

## 2018-05-30 VITALS — BP 122/76 | HR 64 | Temp 98.2°F | Resp 16 | Wt 159.0 lb

## 2018-05-30 DIAGNOSIS — E1169 Type 2 diabetes mellitus with other specified complication: Secondary | ICD-10-CM | POA: Diagnosis not present

## 2018-05-30 DIAGNOSIS — R748 Abnormal levels of other serum enzymes: Secondary | ICD-10-CM | POA: Diagnosis not present

## 2018-05-30 NOTE — Progress Notes (Signed)
Patient: Sharon Nelson Female    DOB: Feb 26, 1951   67 y.o.   MRN: [id number] Visit Date: 05/30/2018  Today's Provider: [name], MD   Chief Complaint  Patient presents with  . Diabetes  . Hypertension   Subjective:    HPI    Diabetes Mellitus Type II, Follow-up:   Lab Results  Component Value Date   HGBA1C 8.5 (A) 04/18/2018   HGBA1C 7.7 (A) 12/15/2017   HGBA1C 8.4 (H) 07/30/2017   Last seen for diabetes 6 weeks ago.  Management since then includes Decreasing Metformin to 1 daily and started Steglatro 81m daily. She reports excellent compliance with treatment. She is having side effects. Pt is concern that she is having an allergic reaction to STXU CorpShe had welps for several days after starting medication, she started taking an antihistamine and welps have resolved, but still itchy.  Current symptoms include none and have been stable. Home blood sugar records: Pt states she does not check her blood sugar often.  But when she does her surgar is in the Mid 100's.   Episodes of hypoglycemia? no   Current Insulin Regimen: None  Weight trend: stable Prior visit with dietician: no Current diet: in general, a "healthy" diet   Current exercise: walking  ------------------------------------------------------------------------   Hypertension, follow-up:  BP Readings from Last 3 Encounters:  05/30/18 122/76  05/04/18 (!) 142/73  04/29/18 130/80    She was last seen for hypertension 6 weeks ago.  BP at that visit was 150/80. Management since that visit includes work on increasing exercise. She reports excellent compliance with treatment. She is not having side effects.  She is exercising. She is adherent to low salt diet.   Outside blood pressures are pt is not checking her BP at home. She is experiencing none.  Patient denies chest pain, exertional chest pressure/discomfort, fatigue, lower extremity edema and palpitations.   Cardiovascular risk  factors include advanced age (older than 537for men, 638for women), diabetes mellitus, dyslipidemia and hypertension.  Use of agents associated with hypertension: none.   ------------------------------------------------------------------------    Wt Readings from Last 3 Encounters:  05/30/18 159 lb (72.1 kg)  05/04/18 157 lb (71.2 kg)  04/29/18 158 lb (71.7 kg)    ------------------------------------------------------------------------       Allergies  Allergen Reactions  . Lisinopril Anaphylaxis and Swelling    WAS ON LIFE SUPPORT   . Iodinated Diagnostic Agents Rash    IV dye, red dye Other Reaction: feeling of heat  . Cinnamon Swelling    Other reaction(s): Swollen lips  . Peanut Oil Swelling  . Penicillins Swelling  . Sulfa Antibiotics Swelling  . Verapamil Hcl Er Swelling  . Milk Protein Rash     Current Outpatient Medications:  .  ACCU-CHEK AVIVA PLUS test strip, TEST once daily, Disp: 100 each, Rfl: 4 .  ADVAIR HFA 115-21 MCG/ACT inhaler, , Disp: , Rfl:  .  albuterol (PROAIR HFA) 108 (90 BASE) MCG/ACT inhaler, Inhale into the lungs every 4 (four) hours as needed. , Disp: , Rfl:  .  aspirin 81 MG tablet, Take 81 mg by mouth daily. , Disp: , Rfl:  .  Blood Glucose Monitoring Suppl (ACCU-CHEK AVIVA PLUS) w/Device KIT, Use to check blood sugar for type 2 diabetes., Disp: 1 kit, Rfl: 0 .  budesonide-formoterol (SYMBICORT) 160-4.5 MCG/ACT inhaler, Inhale into the lungs as needed. , Disp: , Rfl:  .  Cholecalciferol 2000 UNITS CAPS, Take by mouth daily. ,  Disp: , Rfl:  .  Cyanocobalamin (VITAMIN B 12 PO), Take by mouth daily., Disp: , Rfl:  .  EPINEPHrine 0.3 mg/0.3 mL IJ SOAJ injection, , Disp: , Rfl:  .  Ertugliflozin L-PyroglutamicAc (STEGLATRO) 5 MG TABS, Take 1 tablet by mouth every morning., Disp: 42 tablet, Rfl: 0 .  estradiol (ESTRACE) 0.5 MG tablet, TAKE 1 TABLET BY MOUTH ONCE DAILY, Disp: 30 tablet, Rfl: 1 .  gabapentin (NEURONTIN) 100 MG capsule, TAKE 1  CAPSULE BY MOUTH THREE TIMES DAILY, Disp: 90 capsule, Rfl: 4 .  gabapentin (NEURONTIN) 600 MG tablet, TAKE 1 TABLET BY MOUTH THREE TIMES DAILY, Disp: 90 tablet, Rfl: 4 .  levocetirizine (XYZAL) 5 MG tablet, Take 5 mg by mouth every evening. , Disp: , Rfl:  .  lovastatin (MEVACOR) 40 MG tablet, Take 1 tablet by mouth at bedtime, Disp: , Rfl:  .  metFORMIN (GLUCOPHAGE) 500 MG tablet, Take 1 tablet (500 mg total) by mouth daily., Disp: 1 tablet, Rfl: 1 .  spironolactone (ALDACTONE) 100 MG tablet, take 1 tablet by mouth once daily for blood pressure and SWELLING, Disp: 30 tablet, Rfl: 11 .  BIOTIN PO, Take by mouth daily. Reported on 02/05/2016, Disp: , Rfl:   Review of Systems  Constitutional: Negative.   Respiratory: Negative.   Cardiovascular: Negative.   Gastrointestinal: Positive for diarrhea (Pt being followed by GI. ). Negative for abdominal distention, abdominal pain, anal bleeding, blood in stool, constipation, nausea, rectal pain and vomiting.  Endocrine: Negative.   Neurological: Negative for dizziness, light-headedness and headaches.    Social History   Tobacco Use  . Smoking status: Former Smoker    Packs/day: 0.75    Years: 22.00    Pack years: 16.50    Types: Cigarettes    Last attempt to quit: 07/27/1985    Years since quitting: 32.8  . Smokeless tobacco: Never Used  . Tobacco comment: smoked as teenager  Substance Use Topics  . Alcohol use: No   Objective:   BP 122/76 (BP Location: Right Arm, Patient Position: Sitting, Cuff Size: Large)   Pulse 64   Temp 98.2 F (36.8 C) (Oral)   Resp 16   Wt 159 lb (72.1 kg)   BMI 31.05 kg/m     Physical Exam   General Appearance:    Alert, cooperative, no distress  Eyes:    PERRL, conjunctiva/corneas clear, EOM's intact       Lungs:     Clear to auscultation bilaterally, respirations unlabored  Heart:    Regular rate and rhythm  Neurologic:   Awake, alert, oriented x 3. No apparent focal neurological           defect.             Assessment & Plan:     1. Type 2 diabetes mellitus with other specified complication, without long-term current use of insulin (HCC) Sugars better on Steglatro, but having some itching. Consider change to [place] is kidney functions and electrolytes are normal.  - Renal function panel  2. Elevated amylase recheck- Amylase       [name], MD  [facility]

## 2018-05-30 NOTE — Patient Instructions (Addendum)
   Stop taking Steglatro until further notice.

## 2018-05-31 ENCOUNTER — Ambulatory Visit (INDEPENDENT_AMBULATORY_CARE_PROVIDER_SITE_OTHER): Payer: Medicare Other | Admitting: Gastroenterology

## 2018-05-31 ENCOUNTER — Encounter: Payer: Self-pay | Admitting: Gastroenterology

## 2018-05-31 ENCOUNTER — Telehealth: Payer: Self-pay

## 2018-05-31 ENCOUNTER — Other Ambulatory Visit: Payer: Self-pay | Admitting: Obstetrics & Gynecology

## 2018-05-31 VITALS — BP 124/72 | Ht 60.0 in | Wt 157.2 lb

## 2018-05-31 DIAGNOSIS — L209 Atopic dermatitis, unspecified: Secondary | ICD-10-CM | POA: Diagnosis not present

## 2018-05-31 DIAGNOSIS — L501 Idiopathic urticaria: Secondary | ICD-10-CM | POA: Diagnosis not present

## 2018-05-31 DIAGNOSIS — K580 Irritable bowel syndrome with diarrhea: Secondary | ICD-10-CM | POA: Diagnosis not present

## 2018-05-31 DIAGNOSIS — J3089 Other allergic rhinitis: Secondary | ICD-10-CM | POA: Diagnosis not present

## 2018-05-31 DIAGNOSIS — D126 Benign neoplasm of colon, unspecified: Secondary | ICD-10-CM

## 2018-05-31 DIAGNOSIS — J4530 Mild persistent asthma, uncomplicated: Secondary | ICD-10-CM | POA: Diagnosis not present

## 2018-05-31 LAB — RENAL FUNCTION PANEL
Albumin: 4.7 g/dL (ref 3.6–4.8)
BUN/Creatinine Ratio: 13 (ref 12–28)
BUN: 15 mg/dL (ref 8–27)
CO2: 17 mmol/L — ABNORMAL LOW (ref 20–29)
Calcium: 10.4 mg/dL — ABNORMAL HIGH (ref 8.7–10.3)
Chloride: 99 mmol/L (ref 96–106)
Creatinine, Ser: 1.12 mg/dL — ABNORMAL HIGH (ref 0.57–1.00)
GFR calc Af Amer: 58 mL/min/{1.73_m2} — ABNORMAL LOW (ref >59)
GFR calc non Af Amer: 50 mL/min/{1.73_m2} — ABNORMAL LOW (ref >59)
Glucose: 136 mg/dL — ABNORMAL HIGH (ref 65–99)
Phosphorus: 3.7 mg/dL (ref 2.5–4.5)
Potassium: 4.6 mmol/L (ref 3.5–5.2)
Sodium: 139 mmol/L (ref 134–144)

## 2018-05-31 LAB — AMYLASE: Amylase: 145 U/L — ABNORMAL HIGH (ref 31–124)

## 2018-05-31 MED ORDER — AMITRIPTYLINE HCL 25 MG PO TABS: 25.0000 mg | ORAL_TABLET | Freq: Every day | ORAL | 0 refills | Status: DC

## 2018-05-31 MED ORDER — TERCONAZOLE 0.4 % VA CREA: 1.0000 | TOPICAL_CREAM | Freq: Every day | VAGINAL | 0 refills | Status: DC
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
## 2018-05-31 NOTE — Telephone Encounter (Signed)
Will call in alternative medicine to see if helps; vaginal Terazol nightly for 7 nights

## 2018-05-31 NOTE — Telephone Encounter (Signed)
Pt aware.

## 2018-05-31 NOTE — Telephone Encounter (Signed)
Pt was seen 10/4 for yeast inf; given one pill; it did not work.  Pt is worse now than when she was seen.  Does she need to come back in or what to do?  [phone] or [phone]

## 2018-06-01 ENCOUNTER — Other Ambulatory Visit: Payer: Self-pay | Admitting: Family Medicine

## 2018-06-07 DIAGNOSIS — J301 Allergic rhinitis due to pollen: Secondary | ICD-10-CM | POA: Diagnosis not present

## 2018-06-07 DIAGNOSIS — J3089 Other allergic rhinitis: Secondary | ICD-10-CM | POA: Diagnosis not present

## 2018-06-07 LAB — SPECIMEN STATUS REPORT

## 2018-06-07 LAB — LIPASE: Lipase: 54 U/L (ref 14–72)

## 2018-06-08 ENCOUNTER — Telehealth: Payer: Self-pay | Admitting: Family Medicine

## 2018-06-08 NOTE — Telephone Encounter (Signed)
LMTCB 06/08/2018  Thanks,   -Mickel Baas

## 2018-06-08 NOTE — Telephone Encounter (Signed)
Please check with patient and see if itching has gone away since stopping steglatro. If so, then change to [place] 5mg  once a day #30, rf x 2.

## 2018-06-09 NOTE — Telephone Encounter (Signed)
LVMTRC 

## 2018-06-13 NOTE — Telephone Encounter (Signed)
Pt returning call

## 2018-06-13 NOTE — Telephone Encounter (Signed)
Patient returned call on 06/10/2018 and left a message with after hours service requesting a call back.

## 2018-06-13 NOTE — Telephone Encounter (Signed)
LMTCB 06/13/2018  Thanks,   -Mickel Baas

## 2018-06-14 MED ORDER — DAPAGLIFLOZIN PROPANEDIOL 5 MG PO TABS: 5.0000 mg | ORAL_TABLET | Freq: Every day | ORAL | 2 refills | Status: DC

## 2018-06-14 NOTE — Telephone Encounter (Signed)
Ms. Revuelta reports her itching has resolved now.  She would like [place] 5mg  set to [employer].    Thanks,   -Mickel Baas

## 2018-06-21 DIAGNOSIS — J3089 Other allergic rhinitis: Secondary | ICD-10-CM | POA: Diagnosis not present

## 2018-06-21 DIAGNOSIS — J301 Allergic rhinitis due to pollen: Secondary | ICD-10-CM | POA: Diagnosis not present

## 2018-06-30 DIAGNOSIS — J3089 Other allergic rhinitis: Secondary | ICD-10-CM | POA: Diagnosis not present

## 2018-06-30 DIAGNOSIS — J301 Allergic rhinitis due to pollen: Secondary | ICD-10-CM | POA: Diagnosis not present

## 2018-07-05 DIAGNOSIS — J3089 Other allergic rhinitis: Secondary | ICD-10-CM | POA: Diagnosis not present

## 2018-07-05 DIAGNOSIS — J301 Allergic rhinitis due to pollen: Secondary | ICD-10-CM | POA: Diagnosis not present

## 2018-07-07 ENCOUNTER — Telehealth: Payer: Self-pay | Admitting: Family Medicine

## 2018-07-07 DIAGNOSIS — E1169 Type 2 diabetes mellitus with other specified complication: Secondary | ICD-10-CM

## 2018-07-07 NOTE — Telephone Encounter (Signed)
Pt is now having a rash that itches on back and side from the dapagliflozin propanediol (FARXIGA) 5 MG TABS tablet.  Please advise.  Thanks, [employer]

## 2018-07-08 MED ORDER — PIOGLITAZONE HCL 15 MG PO TABS: 15.0000 mg | ORAL_TABLET | Freq: Every day | ORAL | 3 refills | Status: DC

## 2018-07-08 NOTE — Telephone Encounter (Signed)
Patient was advised. Medication send to pharmacy.

## 2018-07-08 NOTE — Telephone Encounter (Signed)
Ok, stop Museum/gallery curator. Will need to try different class of medications for diabetes. Try pioglitazone. 15mg  once a day, #30, rf x 3. Wait till rash goes away before starting new medication.  Schedule follow up for diabetes in 3 months.

## 2018-07-29 DIAGNOSIS — J3089 Other allergic rhinitis: Secondary | ICD-10-CM | POA: Diagnosis not present

## 2018-07-29 DIAGNOSIS — J301 Allergic rhinitis due to pollen: Secondary | ICD-10-CM | POA: Diagnosis not present

## 2018-08-02 ENCOUNTER — Ambulatory Visit (INDEPENDENT_AMBULATORY_CARE_PROVIDER_SITE_OTHER): Payer: Medicare Other | Admitting: Gastroenterology

## 2018-08-02 ENCOUNTER — Encounter: Payer: Self-pay | Admitting: Gastroenterology

## 2018-08-02 VITALS — BP 154/81 | HR 80 | Resp 17 | Ht 60.0 in | Wt 165.6 lb

## 2018-08-02 DIAGNOSIS — R600 Localized edema: Secondary | ICD-10-CM | POA: Diagnosis not present

## 2018-08-02 DIAGNOSIS — K580 Irritable bowel syndrome with diarrhea: Secondary | ICD-10-CM

## 2018-08-02 MED ORDER — AMITRIPTYLINE HCL 25 MG PO TABS: 25.0000 mg | ORAL_TABLET | Freq: Every day | ORAL | 2 refills | Status: DC

## 2018-08-02 MED ORDER — FUROSEMIDE 20 MG PO TABS: 20.0000 mg | ORAL_TABLET | Freq: Every day | ORAL | 0 refills | Status: DC
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
## 2018-08-03 ENCOUNTER — Ambulatory Visit: Payer: Medicare Other

## 2018-08-04 DIAGNOSIS — J3089 Other allergic rhinitis: Secondary | ICD-10-CM | POA: Diagnosis not present

## 2018-08-04 DIAGNOSIS — J301 Allergic rhinitis due to pollen: Secondary | ICD-10-CM | POA: Diagnosis not present

## 2018-08-10 ENCOUNTER — Ambulatory Visit (INDEPENDENT_AMBULATORY_CARE_PROVIDER_SITE_OTHER): Payer: Medicare Other

## 2018-08-10 VITALS — BP 128/54 | HR 73 | Temp 98.7°F | Ht 60.0 in | Wt 163.2 lb

## 2018-08-10 DIAGNOSIS — E2839 Other primary ovarian failure: Secondary | ICD-10-CM

## 2018-08-10 DIAGNOSIS — Z0000 Encounter for general adult medical examination without abnormal findings: Secondary | ICD-10-CM
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
## 2018-08-10 NOTE — Patient Instructions (Addendum)
Sharon Nelson , Thank you for taking time to come for your Medicare Wellness Visit. I appreciate your ongoing commitment to your health goals. Please review the following plan we discussed and let me know if I can assist you in the future.   Screening recommendations/referrals: Colonoscopy: Up to date, due 04/2023 Mammogram: Up to date, due 04/2027 Bone Density: Ordered today, pt aware office will contact her to schedule apt. Recommended yearly ophthalmology/optometry visit for glaucoma screening and checkup Recommended yearly dental visit for hygiene and checkup  Vaccinations: Influenza vaccine: Up to date Pneumococcal vaccine: Completed series Tdap vaccine: Up to date, due 04/2021 Shingles vaccine: Pt declines today.     Advanced directives: Please bring a copy of your POA (Power of Attorney) and/or Living Will to your next appointment.   Conditions/risks identified: Obesity- Continue trying to walk 3 days a week for at least 30 minutes at a time.   Next appointment: 09/08/18 @ 9:00 AM with Dr Caryn Section.    Preventive Care 47 Years and Older, Female Preventive care refers to lifestyle choices and visits with your health care provider that can promote health and wellness. What does preventive care include?  A yearly physical exam. This is also called an annual well check.  Dental exams once or twice a year.  Routine eye exams. Ask your health care provider how often you should have your eyes checked.  Personal lifestyle choices, including:  Daily care of your teeth and gums.  Regular physical activity.  Eating a healthy diet.  Avoiding tobacco and drug use.  Limiting alcohol use.  Practicing safe sex.  Taking low-dose aspirin every day.  Taking vitamin and mineral supplements as recommended by your health care provider. What happens during an annual well check? The services and screenings done by your health care provider during your annual well check will depend on your  age, overall health, lifestyle risk factors, and family history of disease. Counseling  Your health care provider may ask you questions about your:  Alcohol use.  Tobacco use.  Drug use.  Emotional well-being.  Home and relationship well-being.  Sexual activity.  Eating habits.  History of falls.  Memory and ability to understand (cognition).  Work and work Statistician.  Reproductive health. Screening  You may have the following tests or measurements:  Height, weight, and BMI.  Blood pressure.  Lipid and cholesterol levels. These may be checked every 5 years, or more frequently if you are over 29 years old.  Skin check.  Lung cancer screening. You may have this screening every year starting at age 55 if you have a 30-pack-year history of smoking and currently smoke or have quit within the past 15 years.  Fecal occult blood test (FOBT) of the stool. You may have this test every year starting at age 50.  Flexible sigmoidoscopy or colonoscopy. You may have a sigmoidoscopy every 5 years or a colonoscopy every 10 years starting at age 71.  Hepatitis C blood test.  Hepatitis B blood test.  Sexually transmitted disease (STD) testing.  Diabetes screening. This is done by checking your blood sugar (glucose) after you have not eaten for a while (fasting). You may have this done every 1-3 years.  Bone density scan. This is done to screen for osteoporosis. You may have this done starting at age 32.  Mammogram. This may be done every 1-2 years. Talk to your health care provider about how often you should have regular mammograms. Talk with your health care provider  about your test results, treatment options, and if necessary, the need for more tests. Vaccines  Your health care provider may recommend certain vaccines, such as:  Influenza vaccine. This is recommended every year.  Tetanus, diphtheria, and acellular pertussis (Tdap, Td) vaccine. You may need a Td booster  every 10 years.  Zoster vaccine. You may need this after age 38.  Pneumococcal 13-valent conjugate (PCV13) vaccine. One dose is recommended after age 35.  Pneumococcal polysaccharide (PPSV23) vaccine. One dose is recommended after age 67. Talk to your health care provider about which screenings and vaccines you need and how often you need them. This information is not intended to replace advice given to you by your health care provider. Make sure you discuss any questions you have with your health care provider. Document Released: 08/09/2015 Document Revised: 04/01/2016 Document Reviewed: 05/14/2015 Elsevier Interactive Patient Education  2017 [place] Prevention in the Home Falls can cause injuries. They can happen to people of all ages. There are many things you can do to make your home safe and to help prevent falls. What can I do on the outside of my home?  Regularly fix the edges of walkways and driveways and fix any cracks.  Remove anything that might make you trip as you walk through a door, such as a raised step or threshold.  Trim any bushes or trees on the path to your home.  Use bright outdoor lighting.  Clear any walking paths of anything that might make someone trip, such as rocks or tools.  Regularly check to see if handrails are loose or broken. Make sure that both sides of any steps have handrails.  Any raised decks and porches should have guardrails on the edges.  Have any leaves, snow, or ice cleared regularly.  Use sand or salt on walking paths during winter.  Clean up any spills in your garage right away. This includes oil or grease spills. What can I do in the bathroom?  Use night lights.  Install grab bars by the toilet and in the tub and shower. Do not use towel bars as grab bars.  Use non-skid mats or decals in the tub or shower.  If you need to sit down in the shower, use a plastic, non-slip stool.  Keep the floor dry. Clean up any  water that spills on the floor as soon as it happens.  Remove soap buildup in the tub or shower regularly.  Attach bath mats securely with double-sided non-slip rug tape.  Do not have throw rugs and other things on the floor that can make you trip. What can I do in the bedroom?  Use night lights.  Make sure that you have a light by your bed that is easy to reach.  Do not use any sheets or blankets that are too big for your bed. They should not hang down onto the floor.  Have a firm chair that has side arms. You can use this for support while you get dressed.  Do not have throw rugs and other things on the floor that can make you trip. What can I do in the kitchen?  Clean up any spills right away.  Avoid walking on wet floors.  Keep items that you use a lot in easy-to-reach places.  If you need to reach something above you, use a strong step stool that has a grab bar.  Keep electrical cords out of the way.  Do not use floor polish or  wax that makes floors slippery. If you must use wax, use non-skid floor wax.  Do not have throw rugs and other things on the floor that can make you trip. What can I do with my stairs?  Do not leave any items on the stairs.  Make sure that there are handrails on both sides of the stairs and use them. Fix handrails that are broken or loose. Make sure that handrails are as long as the stairways.  Check any carpeting to make sure that it is firmly attached to the stairs. Fix any carpet that is loose or worn.  Avoid having throw rugs at the top or bottom of the stairs. If you do have throw rugs, attach them to the floor with carpet tape.  Make sure that you have a light switch at the top of the stairs and the bottom of the stairs. If you do not have them, ask someone to add them for you. What else can I do to help prevent falls?  Wear shoes that:  Do not have high heels.  Have rubber bottoms.  Are comfortable and fit you well.  Are closed  at the toe. Do not wear sandals.  If you use a stepladder:  Make sure that it is fully opened. Do not climb a closed stepladder.  Make sure that both sides of the stepladder are locked into place.  Ask someone to hold it for you, if possible.  Clearly mark and make sure that you can see:  Any grab bars or handrails.  First and last steps.  Where the edge of each step is.  Use tools that help you move around (mobility aids) if they are needed. These include:  Canes.  Walkers.  Scooters.  Crutches.  Turn on the lights when you go into a dark area. Replace any light bulbs as soon as they burn out.  Set up your furniture so you have a clear path. Avoid moving your furniture around.  If any of your floors are uneven, fix them.  If there are any pets around you, be aware of where they are.  Review your medicines with your doctor. Some medicines can make you feel dizzy. This can increase your chance of falling. Ask your doctor what other things that you can do to help prevent falls. This information is not intended to replace advice given to you by your health care provider. Make sure you discuss any questions you have with your health care provider. Document Released: 05/09/2009 Document Revised: 12/19/2015 Document Reviewed: 08/17/2014 Elsevier Interactive Patient Education  2017 [employer].

## 2018-08-11 DIAGNOSIS — J301 Allergic rhinitis due to pollen: Secondary | ICD-10-CM | POA: Diagnosis not present

## 2018-08-11 DIAGNOSIS — J3089 Other allergic rhinitis: Secondary | ICD-10-CM | POA: Diagnosis not present

## 2018-08-13 ENCOUNTER — Other Ambulatory Visit: Payer: Self-pay | Admitting: Family Medicine

## 2018-08-13 IMAGING — US US ABDOMEN COMPLETE
1 series · 14 of 25 positions shown · non-contrast
Comparison: No recent studies in PACs

CLINICAL DATA: Generalized abdominal pain and bloating

EXAM:
ABDOMEN ULTRASOUND COMPLETE

[Series 1: us abdomen complete · 0.24mm/px · 14 of 79 slices shown]
[im 1/79]
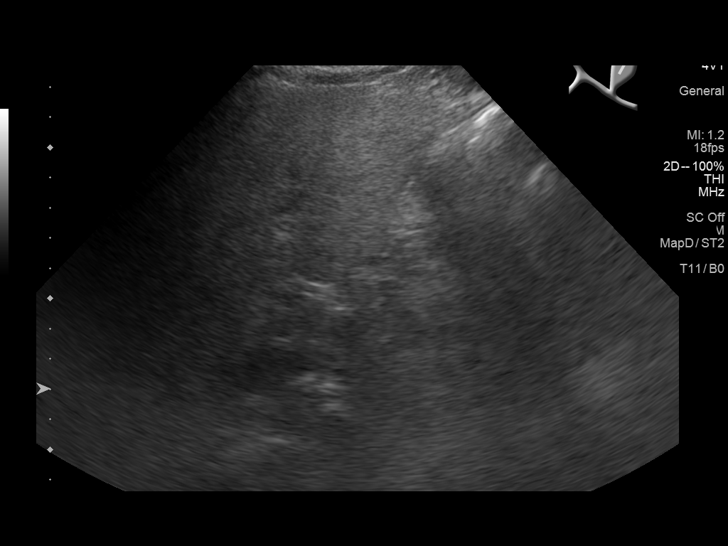
[im 7/79]
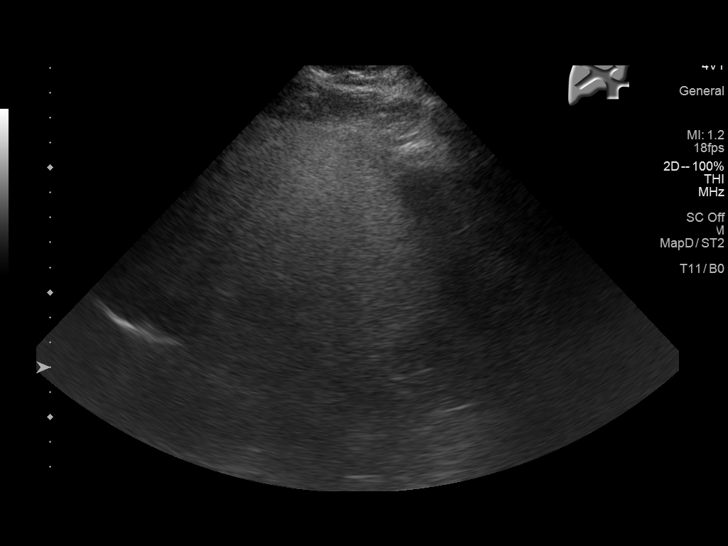
[im 14/79]
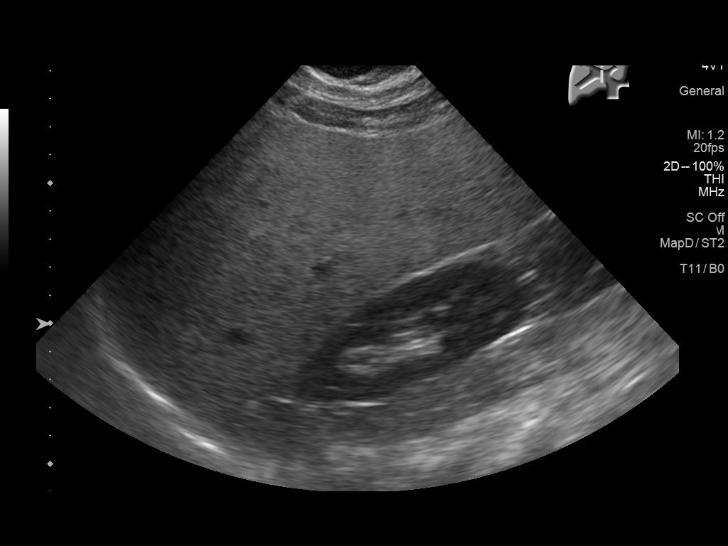
[im 20/79]
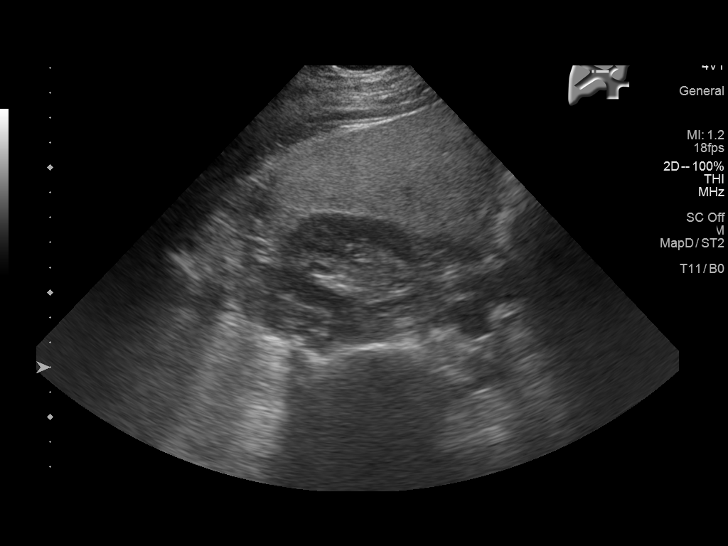
[im 27/79]
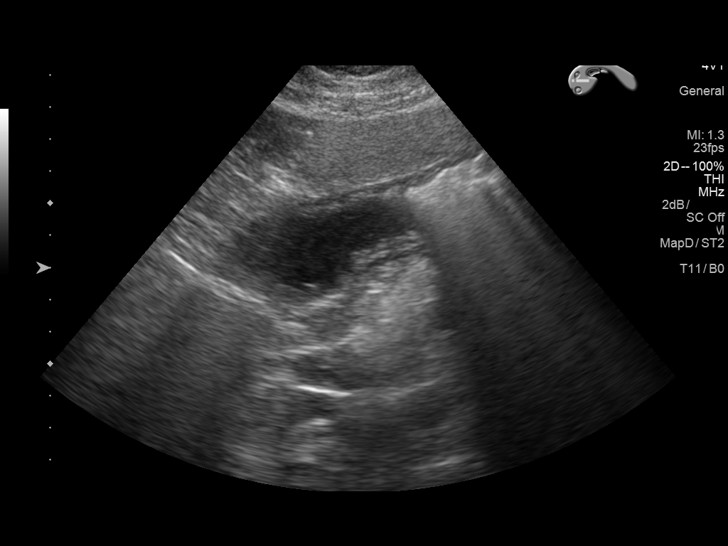
[im 30/79]
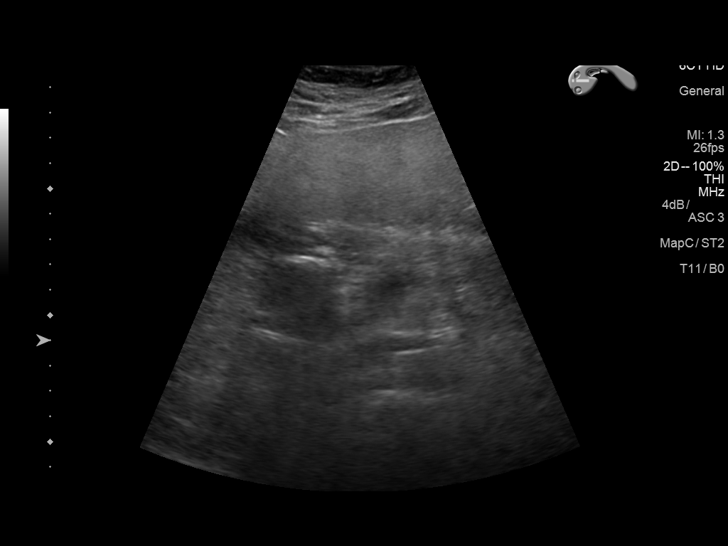
[im 36/79]
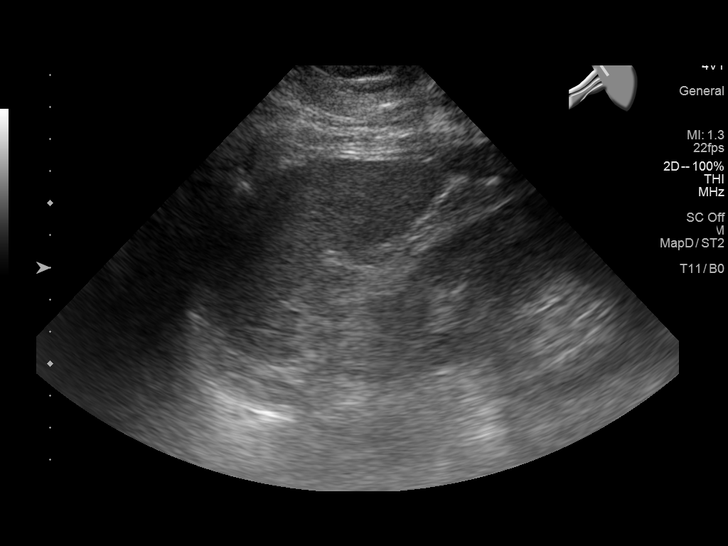
[im 43/79]
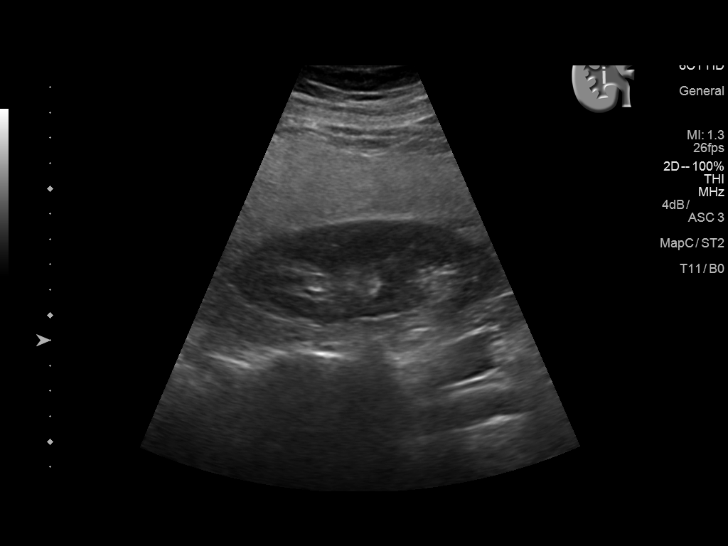
[im 49/79]
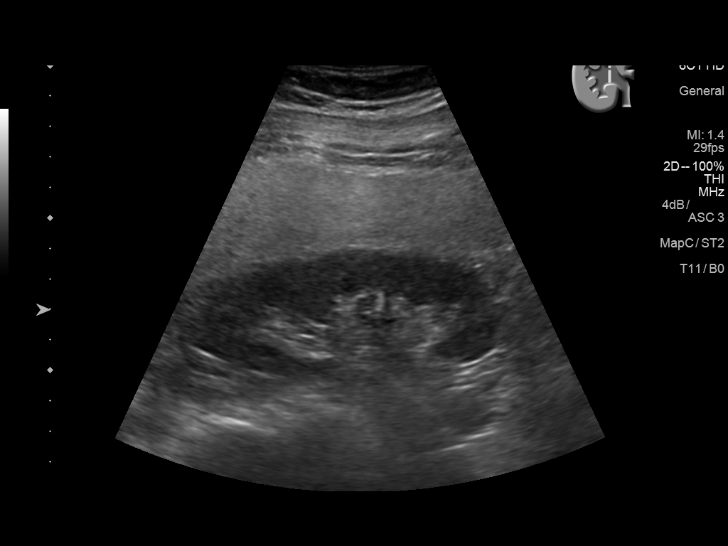
[im 53/79]
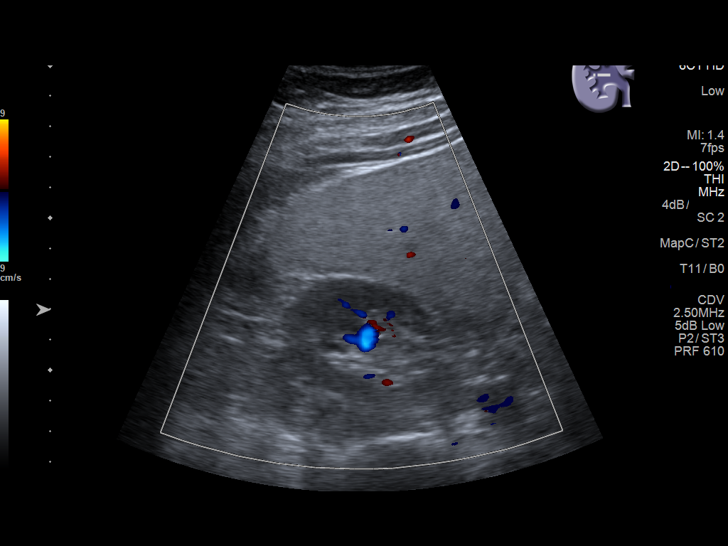
[im 59/79]
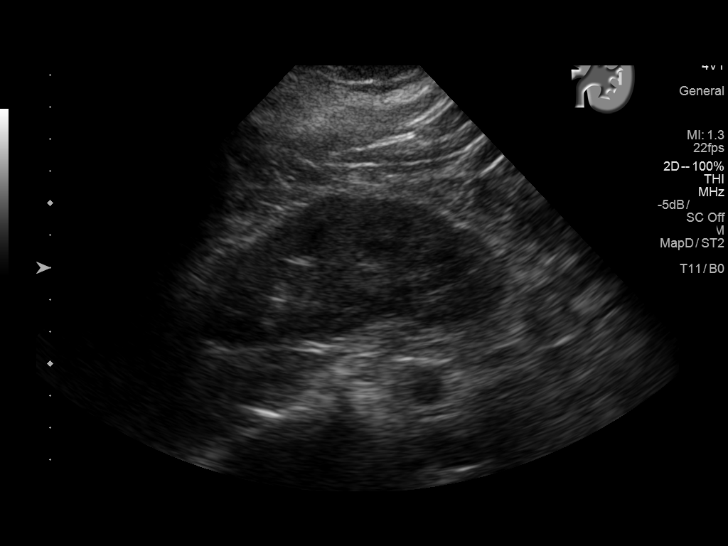
[im 66/79]
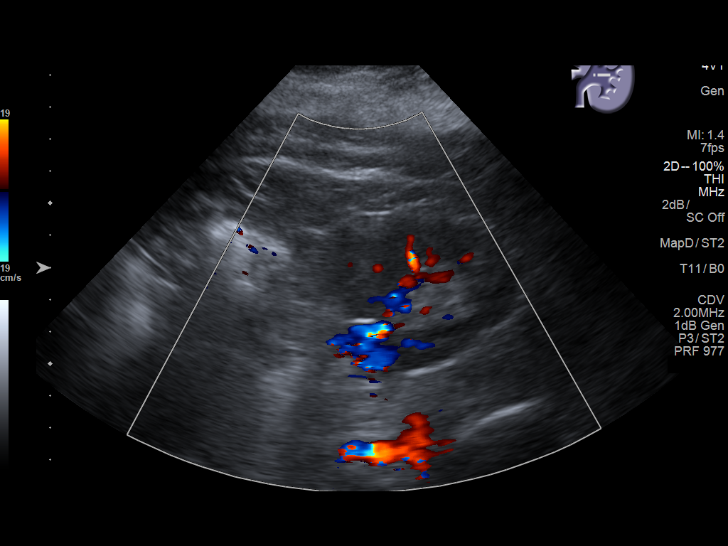
[im 72/79]
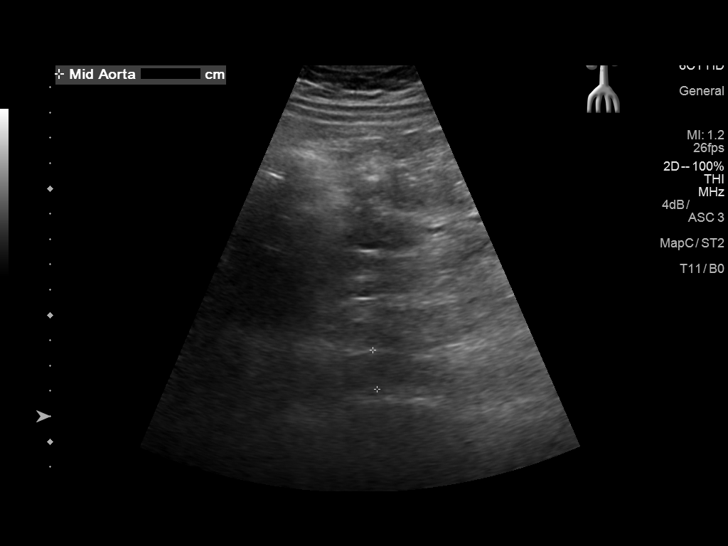
[im 79/79]
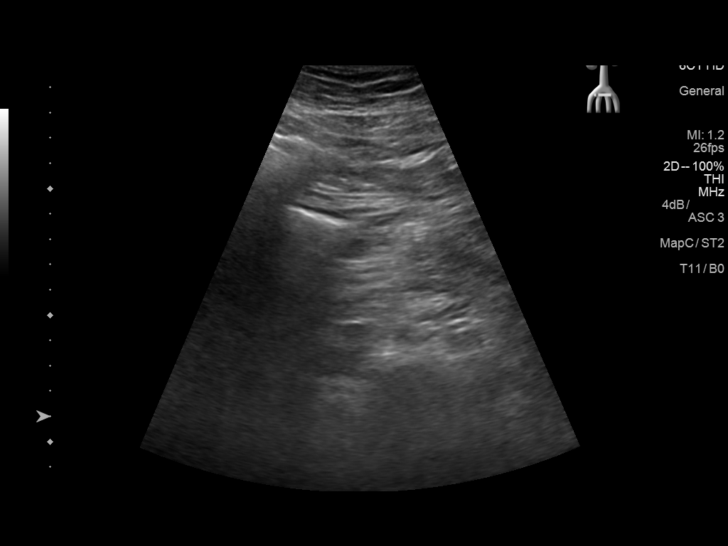

[14 of 25 positions shown; findings below may reference images not displayed]

FINDINGS: Gallbladder: The gallbladder is surgically absent.

Common bile duct: Diameter: 3 mm

Liver: The hepatic echotexture is increased diffusely. The surface
contour remains smooth. There is no focal mass nor ductal dilation.
Portal vein is patent on color Doppler imaging with normal direction
of blood flow towards the liver.

IVC: No abnormality visualized.

Pancreas: There is limited visualization of the pancreatic head and
tail. The pancreatic body is grossly normal.

Spleen: Size and appearance within normal limits.

Right Kidney: Length: 10.9 cm. Echogenicity within normal limits. No
mass or hydronephrosis visualized.

Left Kidney: Length: 9.9 cm. Echogenicity within normal limits. No
mass or hydronephrosis visualized.

Abdominal aorta: No aneurysm visualized.

Other findings: There is no ascites.
IMPRESSION: Increased hepatic echotexture compatible with fatty infiltrative
change. Previous cholecystectomy. Limited visualization of the
pancreas.

No acute intra-abdominal abnormality is observed.

## 2018-08-16 DIAGNOSIS — J3089 Other allergic rhinitis: Secondary | ICD-10-CM | POA: Diagnosis not present

## 2018-08-16 DIAGNOSIS — J301 Allergic rhinitis due to pollen: Secondary | ICD-10-CM | POA: Diagnosis not present

## 2018-08-16 DIAGNOSIS — J3081 Allergic rhinitis due to animal (cat) (dog) hair and dander: Secondary | ICD-10-CM | POA: Diagnosis not present

## 2018-08-17 ENCOUNTER — Telehealth: Payer: Self-pay | Admitting: Gastroenterology

## 2018-08-17 NOTE — Telephone Encounter (Signed)
Patient called and would like a refill on Furosemide 20 mg Called into [facility] on [address]. Just enough until she sees her primary on 09-08-2018.

## 2018-08-22 ENCOUNTER — Telehealth: Payer: Self-pay | Admitting: Family Medicine

## 2018-08-22 NOTE — Telephone Encounter (Signed)
Patient is requesting a refill on Furosemide 20 mg. Dr. Marius Ditch gave her the initial pres. For this but she wanted Dr. Caryn Section to follow this.   [facility] [address].

## 2018-08-23 ENCOUNTER — Other Ambulatory Visit: Payer: Self-pay | Admitting: Gastroenterology

## 2018-08-23 DIAGNOSIS — J3089 Other allergic rhinitis: Secondary | ICD-10-CM | POA: Diagnosis not present

## 2018-08-23 DIAGNOSIS — J301 Allergic rhinitis due to pollen: Secondary | ICD-10-CM | POA: Diagnosis not present

## 2018-08-23 MED ORDER — FUROSEMIDE 20 MG PO TABS: 20.0000 mg | ORAL_TABLET | Freq: Every day | ORAL | 2 refills | Status: DC | PRN

## 2018-08-24 NOTE — Telephone Encounter (Signed)
Medication has been refilled by pt's PCP

## 2018-09-01 ENCOUNTER — Other Ambulatory Visit: Payer: Self-pay | Admitting: Family Medicine

## 2018-09-02 DIAGNOSIS — J301 Allergic rhinitis due to pollen: Secondary | ICD-10-CM | POA: Diagnosis not present

## 2018-09-02 DIAGNOSIS — J3089 Other allergic rhinitis: Secondary | ICD-10-CM | POA: Diagnosis not present

## 2018-09-08 ENCOUNTER — Encounter: Payer: Self-pay | Admitting: Family Medicine

## 2018-09-08 ENCOUNTER — Ambulatory Visit (INDEPENDENT_AMBULATORY_CARE_PROVIDER_SITE_OTHER): Payer: Medicare Other | Admitting: Family Medicine

## 2018-09-08 VITALS — BP 120/60 | HR 72 | Temp 98.6°F | Resp 16 | Ht 60.0 in | Wt 167.0 lb

## 2018-09-08 DIAGNOSIS — E1169 Type 2 diabetes mellitus with other specified complication: Secondary | ICD-10-CM

## 2018-09-08 DIAGNOSIS — E559 Vitamin D deficiency, unspecified: Secondary | ICD-10-CM

## 2018-09-08 DIAGNOSIS — E7800 Pure hypercholesterolemia, unspecified: Secondary | ICD-10-CM

## 2018-09-08 DIAGNOSIS — J301 Allergic rhinitis due to pollen: Secondary | ICD-10-CM | POA: Diagnosis not present

## 2018-09-08 DIAGNOSIS — I10 Essential (primary) hypertension: Secondary | ICD-10-CM

## 2018-09-08 DIAGNOSIS — J3089 Other allergic rhinitis: Secondary | ICD-10-CM | POA: Diagnosis not present

## 2018-09-08 DIAGNOSIS — M85859 Other specified disorders of bone density and structure, unspecified thigh: Secondary | ICD-10-CM

## 2018-09-08 MED ORDER — DULAGLUTIDE 0.75 MG/0.5ML ~~LOC~~ SOAJ: 0.7500 mg | SUBCUTANEOUS | 2 refills | Status: DC

## 2018-09-08 NOTE — Progress Notes (Signed)
Patient: Sharon Nelson Female    DOB: 03-Aug-1950   68 y.o.   MRN: [id number] Visit Date: 09/08/2018  Today's Provider: [name], MD   Chief Complaint  Patient presents with  . Diabetes  . Hypertension  . Hyperlipidemia   Subjective:     HPI  Diabetes Mellitus Type II, Follow-up:   Lab Results  Component Value Date   HGBA1C 8.5 (A) 04/18/2018   HGBA1C 7.7 (A) 12/15/2017   HGBA1C 8.4 (H) 07/30/2017   Last seen for diabetes 3 months ago.  Management during that visit includes [facility] due to itching. Since that visit, Wilder Glade was discontinued and patient was started on Pioglitazone 53m due to allergic reaction.  She reports good compliance with treatment. She is not having side effects.  Current symptoms include none and have been stable. Home blood sugar records: fasting range: 140-157  Episodes of hypoglycemia? no   Current Insulin Regimen: none Most Recent Eye Exam: <1 year ago Weight trend: increasing steadily Prior visit with dietician: no Current diet: well balanced Current exercise: walking  ------------------------------------------------------------------------   Hypertension, follow-up:  BP Readings from Last 3 Encounters:  09/08/18 120/60  08/10/18 (!) 128/54  08/02/18 (!) 154/81    She was last seen for hypertension 4 months ago.  BP at that visit was 150/80. Management since that visit includes counseling patient to work on increasing exercise.She reports good compliance with treatment. She is not having side effects.  She is exercising 10 minutes daily. She is not adherent to low salt diet.   Outside blood pressures are not being checked. She is experiencing lower extremity edema.  Patient denies chest pain, chest pressure/discomfort, claudication, dyspnea, exertional chest pressure/discomfort, fatigue, irregular heart beat, near-syncope, orthopnea, palpitations, paroxysmal nocturnal dyspnea, syncope and  tachypnea.   Cardiovascular risk factors include advanced age (older than 594for men, 684for women), diabetes mellitus, dyslipidemia and hypertension.  Use of agents associated with hypertension: NSAIDS.   ------------------------------------------------------------------------    Lipid/Cholesterol, Follow-up:   Last seen for this 4 months ago.  Management since that visit includes no changes.  Last Lipid Panel:    Component Value Date/Time   CHOL 171 07/30/2017 1125   TRIG 147 07/30/2017 1125   HDL 46 07/30/2017 1125   CHOLHDL 3.7 07/30/2017 1125   LDLCALC 96 07/30/2017 1125    She reports good compliance with treatment. Patient states she stopped taking Pioglitazone because she broke out in hives. She is currently taking Lovastatin.  She is having side effects.   Wt Readings from Last 3 Encounters:  09/08/18 167 lb (75.8 kg)  08/10/18 163 lb 3.2 oz (74 kg)  08/02/18 165 lb 9.6 oz (75.1 kg)    ------------------------------------------------------------------------  Allergies  Allergen Reactions  . Lisinopril Anaphylaxis and Swelling    WAS ON LIFE SUPPORT   . Iodinated Diagnostic Agents Rash    IV dye, red dye Other Reaction: feeling of heat  . Cinnamon Swelling    Other reaction(s): Swollen lips  . Ertugliflozin Itching  . Peanut Oil Swelling  . Penicillins Swelling  . Sulfa Antibiotics Swelling  . Verapamil Hcl Er Swelling  . Amitriptyline Rash  . Milk Protein Rash  . Pioglitazone Rash  . Sglt2 Inhibitors Rash    Same reaction to FWilder Gladeand Steglatro     Current Outpatient Medications:  .  ACCU-CHEK AVIVA PLUS test strip, USE TO TEST ONCE DAILY, Disp: 100 each, Rfl: 4 .  ADVAIR HFA 115-21 MCG/ACT inhaler, , Disp: , Rfl:  .  albuterol (PROAIR HFA) 108 (90 BASE) MCG/ACT inhaler, Inhale into the lungs every 4 (four) hours as needed. , Disp: , Rfl:  .  aspirin 81 MG tablet, Take 81 mg by mouth daily. , Disp: , Rfl:  .  BIOTIN PO, Take by mouth daily.  Reported on 02/05/2016, Disp: , Rfl:  .  Blood Glucose Monitoring Suppl (ACCU-CHEK AVIVA PLUS) w/Device KIT, Use to check blood sugar for type 2 diabetes., Disp: 1 kit, Rfl: 0 .  budesonide-formoterol (SYMBICORT) 160-4.5 MCG/ACT inhaler, Inhale into the lungs as needed. , Disp: , Rfl:  .  Cholecalciferol 2000 UNITS CAPS, Take by mouth daily. , Disp: , Rfl:  .  Cyanocobalamin (VITAMIN B 12 PO), Take by mouth daily., Disp: , Rfl:  .  EPINEPHrine 0.3 mg/0.3 mL IJ SOAJ injection, , Disp: , Rfl:  .  estradiol (ESTRACE) 0.5 MG tablet, TAKE 1 TABLET BY MOUTH EVERY DAY, Disp: 30 tablet, Rfl: 11 .  gabapentin (NEURONTIN) 100 MG capsule, TAKE 1 CAPSULE BY MOUTH THREE TIMES DAILY, Disp: 90 capsule, Rfl: 4 .  gabapentin (NEURONTIN) 600 MG tablet, TAKE 1 TABLET BY MOUTH THREE TIMES DAILY, Disp: 90 tablet, Rfl: 4 .  levocetirizine (XYZAL) 5 MG tablet, Take 5 mg by mouth every evening. , Disp: , Rfl:  .  lovastatin (MEVACOR) 40 MG tablet, Take 1 tablet by mouth at bedtime, Disp: , Rfl:  .  metFORMIN (GLUCOPHAGE) 500 MG tablet, Take 1 tablet (500 mg total) by mouth daily., Disp: 1 tablet, Rfl: 1 (NOT TAKING) .  spironolactone (ALDACTONE) 100 MG tablet, TAKE 1 TABLET BY MOUTH ONCE DAILY FOR BLOOD PRESSURE AND SWELLING, Disp: 30 tablet, Rfl: 11 .  terconazole (TERAZOL 7) 0.4 % vaginal cream, Place 1 applicator vaginally at bedtime., Disp: 45 g, Rfl: 0 .  amitriptyline (ELAVIL) 25 MG tablet, Take 1 tablet (25 mg total) by mouth at bedtime., Disp: 30 tablet, Rfl: 2 .  furosemide (LASIX) 20 MG tablet, Take 1 tablet (20 mg total) by mouth daily as needed for edema. (Patient not taking: Reported on 09/08/2018), Disp: 30 tablet, Rfl: 2 .  pioglitazone (ACTOS) 15 MG tablet, Take 1 tablet (15 mg total) by mouth daily. (Patient not taking: Reported on 09/08/2018), Disp: 30 tablet, Rfl: 3  Review of Systems  Constitutional: Negative for chills, fatigue and fever.  HENT: Negative for congestion, ear pain, rhinorrhea, sneezing  and sore throat.   Eyes: Negative.  Negative for pain and redness.  Respiratory: Negative for cough, shortness of breath and wheezing.   Cardiovascular: Positive for leg swelling. Negative for chest pain.  Gastrointestinal: Positive for constipation. Negative for abdominal pain, blood in stool, diarrhea and nausea.  Endocrine: Negative for polydipsia and polyphagia.  Genitourinary: Negative.  Negative for dysuria, flank pain, hematuria, pelvic pain, vaginal bleeding and vaginal discharge.  Musculoskeletal: Positive for arthralgias, back pain, joint swelling, neck pain and neck stiffness. Negative for gait problem.  Skin: Positive for rash.  Allergic/Immunologic: Positive for environmental allergies and food allergies.  Neurological: Positive for headaches. Negative for dizziness, tremors, seizures, weakness, light-headedness and numbness.  Hematological: Negative for adenopathy.  Psychiatric/Behavioral: Negative.  Negative for behavioral problems, confusion and dysphoric mood. The patient is not nervous/anxious and is not hyperactive.     Social History   Tobacco Use  . Smoking status: Former Smoker    Packs/day: 0.75    Years: 22.00    Pack years: 16.50  Types: Cigarettes    Last attempt to quit: 07/27/1985    Years since quitting: 33.1  . Smokeless tobacco: Never Used  . Tobacco comment: smoked as teenager  Substance Use Topics  . Alcohol use: No      Objective:   BP 120/60 (BP Location: Left Arm, Patient Position: Sitting, Cuff Size: Large)   Pulse 72   Temp 98.6 F (37 C) (Oral)   Resp 16   Ht 5' (1.524 m)   Wt 167 lb (75.8 kg)   SpO2 96% Comment: room air  BMI 32.61 kg/m    Physical Exam   General Appearance:    Alert, cooperative, no distress  Eyes:    PERRL, conjunctiva/corneas clear, EOM's intact       Lungs:     Clear to auscultation bilaterally, respirations unlabored  Heart:    Regular rate and rhythm  Neurologic:   Awake, alert, oriented x 3. No  apparent focal neurological           defect.          Assessment & Plan    1. Type 2 diabetes mellitus with other specified complication, without long-term current use of insulin (HCC) intolerant to pioglitazone and SGLT-1 inhibitors. Uncontrolled by monotherapy with metformin, which she has not discontinued. She is very interested in trying weekly injectable medication. Counseled on potential adverse effects of GLP1 antagonists and she wishes to proceed. Given instructions on injection of 9.92EQ Trulicity today with 3 sample pens and written prescription.   - Dulaglutide (TRULICITY) 6.83 MH/9.6QI SOPN; Inject 0.75 mg into the skin once a week.  Dispense: 4 pen; Refill: 2 - Comprehensive metabolic panel  2. Essential (primary) hypertension Well controlled.  Continue current medications.   - Comprehensive metabolic panel  3. Vitamin D deficiency check- VITAMIN D 25 Hydroxy (Vit-D Deficiency, Fractures)  4. Hypercholesteremia She is tolerating lovastatin well with no adverse effects.   - Lipid panel  5. Osteopenia of hip, unspecified laterality Is scheduled for follow up BMD.      [name], MD  [facility]

## 2018-09-08 NOTE — Patient Instructions (Addendum)
.   Please review the attached list of medications and notify my office if there are any errors.   . Please bring all of your medications to every appointment so we can make sure that our medication list is the same as yours.    Start Trulicity, one injection every 7 days

## 2018-09-09 ENCOUNTER — Telehealth: Payer: Self-pay

## 2018-09-09 LAB — COMPREHENSIVE METABOLIC PANEL
ALT: 15 IU/L (ref 0–32)
AST: 14 IU/L (ref 0–40)
Albumin/Globulin Ratio: 1.4 (ref 1.2–2.2)
Albumin: 4.3 g/dL (ref 3.8–4.8)
Alkaline Phosphatase: 74 IU/L (ref 39–117)
BUN/Creatinine Ratio: 13 (ref 12–28)
BUN: 13 mg/dL (ref 8–27)
Bilirubin Total: <0.2 mg/dL (ref 0.0–1.2)
CO2: 25 mmol/L (ref 20–29)
Calcium: 9.8 mg/dL (ref 8.7–10.3)
Chloride: 99 mmol/L (ref 96–106)
Creatinine, Ser: 1.02 mg/dL — ABNORMAL HIGH (ref 0.57–1.00)
GFR calc Af Amer: 65 mL/min/{1.73_m2} (ref >59)
GFR calc non Af Amer: 56 mL/min/{1.73_m2} — ABNORMAL LOW (ref >59)
Globulin, Total: 3 g/dL (ref 1.5–4.5)
Glucose: 155 mg/dL — ABNORMAL HIGH (ref 65–99)
Potassium: 4.5 mmol/L (ref 3.5–5.2)
Sodium: 139 mmol/L (ref 134–144)
Total Protein: 7.3 g/dL (ref 6.0–8.5)

## 2018-09-09 LAB — VITAMIN D 25 HYDROXY (VIT D DEFICIENCY, FRACTURES): Vit D, 25-Hydroxy: 52.6 ng/mL (ref 30.0–100.0)

## 2018-09-09 LAB — LIPID PANEL
Chol/HDL Ratio: 4.2 ratio (ref 0.0–4.4)
Cholesterol, Total: 198 mg/dL (ref 100–199)
HDL: 47 mg/dL (ref >39)
LDL Calculated: 117 mg/dL — ABNORMAL HIGH (ref 0–99)
Triglycerides: 168 mg/dL — ABNORMAL HIGH (ref 0–149)
VLDL Cholesterol Cal: 34 mg/dL (ref 5–40)

## 2018-09-09 LAB — HEMOGLOBIN A1C
Est. average glucose Bld gHb Est-mCnc: 203 mg/dL
Hgb A1c MFr Bld: 8.7 % — ABNORMAL HIGH (ref 4.8–5.6)

## 2018-09-09 NOTE — Telephone Encounter (Signed)
Pt advised.   Thanks,   -Laura  

## 2018-09-09 NOTE — Telephone Encounter (Signed)
-----   Message from Birdie Sons, MD sent at 09/09/2018  7:53 AM EST ----- a1c is up to 8.7. go ahead and start Trulicity samples that were given. Call if any problems with samples or filling prescription  Cholesterol is up from 171 to 198. Need to avoid saturated fats such as red meats. Continue current dose of lovastatin. Follow up in may as scheduled.

## 2018-09-13 DIAGNOSIS — J301 Allergic rhinitis due to pollen: Secondary | ICD-10-CM | POA: Diagnosis not present

## 2018-09-13 DIAGNOSIS — J3089 Other allergic rhinitis: Secondary | ICD-10-CM | POA: Diagnosis not present

## 2018-09-22 DIAGNOSIS — J301 Allergic rhinitis due to pollen: Secondary | ICD-10-CM | POA: Diagnosis not present

## 2018-09-22 DIAGNOSIS — J3089 Other allergic rhinitis: Secondary | ICD-10-CM | POA: Diagnosis not present

## 2018-09-26 DIAGNOSIS — E119 Type 2 diabetes mellitus without complications: Secondary | ICD-10-CM | POA: Diagnosis not present

## 2018-09-26 LAB — HM DIABETES EYE EXAM

## 2018-09-27 DIAGNOSIS — J301 Allergic rhinitis due to pollen: Secondary | ICD-10-CM | POA: Diagnosis not present

## 2018-09-27 DIAGNOSIS — J3089 Other allergic rhinitis: Secondary | ICD-10-CM | POA: Diagnosis not present

## 2018-09-29 ENCOUNTER — Ambulatory Visit
Admission: RE | Admit: 2018-09-29 | Discharge: 2018-09-29 | Disposition: A | Discharge status: Home / Self Care | Payer: Medicare Other | Source: Ambulatory Visit | Attending: Family Medicine | Admitting: Family Medicine

## 2018-09-29 DIAGNOSIS — M85851 Other specified disorders of bone density and structure, right thigh: Secondary | ICD-10-CM | POA: Diagnosis not present

## 2018-09-29 DIAGNOSIS — E2839 Other primary ovarian failure: Secondary | ICD-10-CM | POA: Diagnosis not present

## 2018-09-30 ENCOUNTER — Telehealth: Payer: Self-pay

## 2018-09-30 NOTE — Telephone Encounter (Signed)
Pt advised.   Thanks,   -Laura  

## 2018-09-30 NOTE — Telephone Encounter (Signed)
-----   Message from Birdie Sons, MD sent at 09/29/2018  5:41 PM EST ----- BMD is stable. Repeat bone density test Q3 years

## 2018-10-04 ENCOUNTER — Encounter: Payer: Self-pay | Admitting: Family Medicine

## 2018-10-06 DIAGNOSIS — J301 Allergic rhinitis due to pollen: Secondary | ICD-10-CM | POA: Diagnosis not present

## 2018-10-06 DIAGNOSIS — J3089 Other allergic rhinitis: Secondary | ICD-10-CM | POA: Diagnosis not present

## 2018-10-14 DIAGNOSIS — J301 Allergic rhinitis due to pollen: Secondary | ICD-10-CM | POA: Diagnosis not present

## 2018-10-14 DIAGNOSIS — J3089 Other allergic rhinitis: Secondary | ICD-10-CM | POA: Diagnosis not present

## 2018-10-25 DIAGNOSIS — J3081 Allergic rhinitis due to animal (cat) (dog) hair and dander: Secondary | ICD-10-CM | POA: Diagnosis not present

## 2018-10-25 DIAGNOSIS — J3089 Other allergic rhinitis: Secondary | ICD-10-CM | POA: Diagnosis not present

## 2018-10-25 DIAGNOSIS — J301 Allergic rhinitis due to pollen: Secondary | ICD-10-CM | POA: Diagnosis not present

## 2018-11-01 ENCOUNTER — Ambulatory Visit: Payer: Medicare Other | Admitting: Gastroenterology

## 2018-11-01 DIAGNOSIS — J3089 Other allergic rhinitis: Secondary | ICD-10-CM | POA: Diagnosis not present

## 2018-11-01 DIAGNOSIS — J301 Allergic rhinitis due to pollen: Secondary | ICD-10-CM | POA: Diagnosis not present

## 2018-11-08 DIAGNOSIS — J301 Allergic rhinitis due to pollen: Secondary | ICD-10-CM | POA: Diagnosis not present

## 2018-11-08 DIAGNOSIS — J3089 Other allergic rhinitis: Secondary | ICD-10-CM | POA: Diagnosis not present

## 2018-11-17 DIAGNOSIS — J3089 Other allergic rhinitis: Secondary | ICD-10-CM | POA: Diagnosis not present

## 2018-11-17 DIAGNOSIS — J3081 Allergic rhinitis due to animal (cat) (dog) hair and dander: Secondary | ICD-10-CM | POA: Diagnosis not present

## 2018-11-17 DIAGNOSIS — J301 Allergic rhinitis due to pollen: Secondary | ICD-10-CM | POA: Diagnosis not present

## 2018-11-18 ENCOUNTER — Other Ambulatory Visit: Payer: Self-pay

## 2018-11-18 ENCOUNTER — Telehealth: Payer: Self-pay | Admitting: Family Medicine

## 2018-11-18 ENCOUNTER — Ambulatory Visit (INDEPENDENT_AMBULATORY_CARE_PROVIDER_SITE_OTHER): Payer: Medicare Other | Admitting: Gastroenterology

## 2018-11-18 DIAGNOSIS — R0789 Other chest pain: Secondary | ICD-10-CM | POA: Diagnosis not present

## 2018-11-18 NOTE — Telephone Encounter (Signed)
-----   Message from Lin Landsman, MD sent at 11/18/2018 11:11 AM EDT ----- Regarding: chest pain Dr Caryn Section,   I'm seeing her now, she has chest pain for 60month., worsening and SOB. Asked her to go to ER, she said, she is taking care of 3 children at her home and has to make arrangements before she can go. She calls it as dull deep pain, very uncomfortable feeling. I see that she had stress test year ado, normal. But, with her diabetes not well controlled, I'm worried about cardiac cause. Once, that's is ruled, I will manage her GI issues  Thanks much Rohini Vanga

## 2018-11-18 NOTE — Telephone Encounter (Signed)
Patient had virtual visit with Dr. Marius Ditch this morning regarding chest pain. Please advise if she is still having chest pain she should go to ER, otherwise she can schedule office visit to have evaluation. She will need an EKG so she needs to be seen in office.  If she is not already taking a medication for refux she can try Prilosec OTC until she is seen. Thanks.
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
## 2018-11-18 NOTE — Telephone Encounter (Signed)
Spoke with pt.  She states she is still having the chest pain.  I advised her that you agreed with Dr. Marius Ditch and that she should go to the ER.  I suggested she have the kids picked up and have someone take her to the ER.  She agreed and stated she will do that.   Thanks,   -Mickel Baas

## 2018-11-19 ENCOUNTER — Other Ambulatory Visit: Payer: Self-pay

## 2018-11-19 ENCOUNTER — Emergency Department
Admission: EM | Admit: 2018-11-19 | Discharge: 2018-11-19 | Disposition: A | Discharge status: Home / Self Care | Payer: Medicare Other | Attending: Emergency Medicine | Admitting: Emergency Medicine

## 2018-11-19 ENCOUNTER — Emergency Department: Payer: Medicare Other

## 2018-11-19 DIAGNOSIS — I10 Essential (primary) hypertension: Secondary | ICD-10-CM | POA: Insufficient documentation

## 2018-11-19 DIAGNOSIS — R079 Chest pain, unspecified: Secondary | ICD-10-CM | POA: Diagnosis not present

## 2018-11-19 DIAGNOSIS — Z79899 Other long term (current) drug therapy: Secondary | ICD-10-CM | POA: Diagnosis not present

## 2018-11-19 DIAGNOSIS — Z7982 Long term (current) use of aspirin: Secondary | ICD-10-CM | POA: Diagnosis not present

## 2018-11-19 DIAGNOSIS — Z7984 Long term (current) use of oral hypoglycemic drugs: Secondary | ICD-10-CM | POA: Diagnosis not present

## 2018-11-19 DIAGNOSIS — J45909 Unspecified asthma, uncomplicated: Secondary | ICD-10-CM | POA: Insufficient documentation

## 2018-11-19 DIAGNOSIS — Z87891 Personal history of nicotine dependence: Secondary | ICD-10-CM | POA: Insufficient documentation

## 2018-11-19 DIAGNOSIS — E119 Type 2 diabetes mellitus without complications: Secondary | ICD-10-CM | POA: Diagnosis not present

## 2018-11-19 LAB — BASIC METABOLIC PANEL
Anion gap: 7 (ref 5–15)
BUN: 9 mg/dL (ref 8–23)
CO2: 25 mmol/L (ref 22–32)
Calcium: 8.8 mg/dL — ABNORMAL LOW (ref 8.9–10.3)
Chloride: 104 mmol/L (ref 98–111)
Creatinine, Ser: 0.91 mg/dL (ref 0.44–1.00)
GFR calc Af Amer: >60 mL/min (ref >60)
GFR calc non Af Amer: >60 mL/min (ref >60)
Glucose, Bld: 189 mg/dL — ABNORMAL HIGH (ref 70–99)
Potassium: 4.5 mmol/L (ref 3.5–5.1)
Sodium: 136 mmol/L (ref 135–145)

## 2018-11-19 LAB — CBC
HCT: 37.5 % (ref 36.0–46.0)
Hemoglobin: 11.7 g/dL — ABNORMAL LOW (ref 12.0–15.0)
MCH: 27 pg (ref 26.0–34.0)
MCHC: 31.2 g/dL (ref 30.0–36.0)
MCV: 86.6 fL (ref 80.0–100.0)
Platelets: 254 10*3/uL (ref 150–400)
RBC: 4.33 MIL/uL (ref 3.87–5.11)
RDW: 14.5 % (ref 11.5–15.5)
WBC: 7.4 10*3/uL (ref 4.0–10.5)
nRBC: 0 % (ref 0.0–0.2)

## 2018-11-19 LAB — TROPONIN I: Troponin I: <0.03 ng/mL (ref <0.03)

## 2018-11-19 MED ORDER — OMEPRAZOLE 40 MG PO CPDR: 40.0000 mg | DELAYED_RELEASE_CAPSULE | Freq: Every day | ORAL | 1 refills | Status: DC

## 2018-11-19 NOTE — ED Triage Notes (Signed)
Pt A&O, ambulatory. No distress noted. States chest pressure x 2 weeks. States on L side. "it escalated last week." denies any other symptoms.

## 2018-11-19 NOTE — ED Notes (Signed)
Pt states that pressure comes and goes, describes it as "heart burn, but without the burning". Currently not experiencing pain at this time.

## 2018-11-19 NOTE — Discharge Instructions (Signed)
Please seek medical attention for any high fevers, chest pain, shortness of breath, change in behavior, persistent vomiting, bloody stool or any other new or concerning symptoms.  
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
## 2018-11-24 DIAGNOSIS — J301 Allergic rhinitis due to pollen: Secondary | ICD-10-CM | POA: Diagnosis not present

## 2018-11-24 DIAGNOSIS — J3089 Other allergic rhinitis: Secondary | ICD-10-CM | POA: Diagnosis not present

## 2018-11-29 ENCOUNTER — Ambulatory Visit (INDEPENDENT_AMBULATORY_CARE_PROVIDER_SITE_OTHER): Payer: Medicare Other | Admitting: Gastroenterology

## 2018-11-29 DIAGNOSIS — K5904 Chronic idiopathic constipation: Secondary | ICD-10-CM

## 2018-11-29 DIAGNOSIS — R1013 Epigastric pain: Secondary | ICD-10-CM
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
## 2018-11-30 ENCOUNTER — Telehealth: Payer: Self-pay | Admitting: Gastroenterology

## 2018-11-30 ENCOUNTER — Other Ambulatory Visit: Payer: Self-pay

## 2018-11-30 MED ORDER — LUBIPROSTONE 24 MCG PO CAPS: 24.0000 ug | ORAL_CAPSULE | Freq: Two times a day (BID) | ORAL | 0 refills | Status: AC

## 2018-11-30 NOTE — Telephone Encounter (Signed)
Prescription has been sent to pharmacy, pt has been notified and verbalized understanding  

## 2018-11-30 NOTE — Telephone Encounter (Signed)
Patient called in stating she was to call back with the name & mg of medication. Amitiza 24 mcq called into the [facility] on [address] in [place].

## 2018-12-06 ENCOUNTER — Ambulatory Visit: Payer: Medicare Other | Admitting: Gastroenterology

## 2018-12-06 DIAGNOSIS — J301 Allergic rhinitis due to pollen: Secondary | ICD-10-CM | POA: Diagnosis not present

## 2018-12-06 DIAGNOSIS — J3089 Other allergic rhinitis: Secondary | ICD-10-CM | POA: Diagnosis not present

## 2018-12-13 DIAGNOSIS — J3089 Other allergic rhinitis: Secondary | ICD-10-CM | POA: Diagnosis not present

## 2018-12-13 DIAGNOSIS — J301 Allergic rhinitis due to pollen: Secondary | ICD-10-CM | POA: Diagnosis not present

## 2018-12-16 DIAGNOSIS — J3081 Allergic rhinitis due to animal (cat) (dog) hair and dander: Secondary | ICD-10-CM | POA: Diagnosis not present

## 2018-12-16 DIAGNOSIS — J301 Allergic rhinitis due to pollen: Secondary | ICD-10-CM | POA: Diagnosis not present

## 2018-12-16 DIAGNOSIS — J3089 Other allergic rhinitis: Secondary | ICD-10-CM | POA: Diagnosis not present

## 2018-12-20 ENCOUNTER — Other Ambulatory Visit: Payer: Self-pay

## 2018-12-20 ENCOUNTER — Encounter: Payer: Self-pay | Admitting: Family Medicine

## 2018-12-20 ENCOUNTER — Ambulatory Visit (INDEPENDENT_AMBULATORY_CARE_PROVIDER_SITE_OTHER): Payer: Medicare Other | Admitting: Family Medicine

## 2018-12-20 VITALS — BP 127/72 | HR 71 | Temp 98.4°F | Resp 16 | Wt 157.0 lb

## 2018-12-20 DIAGNOSIS — E1169 Type 2 diabetes mellitus with other specified complication: Secondary | ICD-10-CM | POA: Diagnosis not present

## 2018-12-20 DIAGNOSIS — R413 Other amnesia: Secondary | ICD-10-CM | POA: Diagnosis not present

## 2018-12-20 LAB — POCT GLYCOSYLATED HEMOGLOBIN (HGB A1C)
Est. average glucose Bld gHb Est-mCnc: 169
Hemoglobin A1C: 7.5 % — AB (ref 4.0–5.6)

## 2018-12-20 MED ORDER — DONEPEZIL HCL 5 MG PO TABS: 5.0000 mg | ORAL_TABLET | Freq: Every day | ORAL | 3 refills | Status: DC

## 2018-12-20 MED ORDER — SEMAGLUTIDE(0.25 OR 0.5MG/DOS) 2 MG/1.5ML ~~LOC~~ SOPN: 0.5000 mg | PEN_INJECTOR | SUBCUTANEOUS | 5 refills | Status: DC

## 2018-12-20 NOTE — Progress Notes (Signed)
Patient: Sharon Nelson Female    DOB: 19-Mar-1951   68 y.o.   MRN: [id number] Visit Date: 12/20/2018  Today's Provider: [name], MD   Chief Complaint  Patient presents with  . Diabetes  . Hyperlipidemia   Subjective:     HPI  Diabetes Mellitus Type II, Follow-up:   Lab Results  Component Value Date   HGBA1C 8.7 (H) 09/08/2018   HGBA1C 8.5 (A) 04/18/2018   HGBA1C 7.7 (A) 12/15/2017    Last seen for diabetes 3 months ago.  Management since then includes starting samples of Trulicity. She reports good compliance with treatment. She is having side effects.  Patient reports feeling nauseous after using Trulicity.  Has also been having itching the last few weeks.   Home blood sugar records: fasting range: 137-200  Episodes of hypoglycemia? no   Current Insulin Regimen: Trulicity Most Recent Eye Exam:  09/26/2018 Weight trend: fluctuating a bit Prior visit with dietician: No Current exercise: walking Current diet habits: regular diet  Pertinent Labs:    Component Value Date/Time   CHOL 198 09/08/2018 1012   TRIG 168 (H) 09/08/2018 1012   HDL 47 09/08/2018 1012   LDLCALC 117 (H) 09/08/2018 1012   CREATININE 0.91 11/19/2018 0836   CREATININE 1.04 02/12/2014 0353    Wt Readings from Last 3 Encounters:  12/20/18 157 lb (71.2 kg)  11/19/18 170 lb (77.1 kg)  09/08/18 167 lb (75.8 kg)    ------------------------------------------------------------------------  Lipid/Cholesterol, Follow-up:   Last seen for this3 months ago.  Management changes since that visit include none. . Last Lipid Panel:    Component Value Date/Time   CHOL 198 09/08/2018 1012   TRIG 168 (H) 09/08/2018 1012   HDL 47 09/08/2018 1012   CHOLHDL 4.2 09/08/2018 1012   LDLCALC 117 (H) 09/08/2018 1012    Risk factors for vascular disease include diabetes mellitus and hypercholesterolemia  She reports good compliance with treatment. She is not having side effects.  Current  symptoms include none and have been stable. Weight trend: fluctuating a bit Prior visit with dietician: no Current diet: regular diet Current exercise: walking  Wt Readings from Last 3 Encounters:  12/20/18 157 lb (71.2 kg)  11/19/18 170 lb (77.1 kg)  09/08/18 167 lb (75.8 kg)    -------------------------------------------------------------------  She also reports she is getting more forgetful. Often forgets to take her evening medications. She was thinking about trying Prevagen.    Allergies  Allergen Reactions  . Lisinopril Anaphylaxis and Swelling    WAS ON LIFE SUPPORT   . Iodinated Diagnostic Agents Rash    IV dye, red dye Other Reaction: feeling of heat  . Cinnamon Swelling    Other reaction(s): Swollen lips  . Ertugliflozin Itching  . Omeprazole     Broke out into rash  . Peanut Oil Swelling  . Penicillins Swelling  . Sulfa Antibiotics Swelling  . Verapamil Hcl Er Swelling  . Amitriptyline Rash  . Milk Protein Rash  . Pioglitazone Rash  . Sglt2 Inhibitors Rash    Same reaction to Wilder Glade and Steglatro     Current Outpatient Medications:  .  ACCU-CHEK AVIVA PLUS test strip, USE TO TEST ONCE DAILY, Disp: 100 each, Rfl: 4 .  ADVAIR HFA 115-21 MCG/ACT inhaler, , Disp: , Rfl:  .  albuterol (PROAIR HFA) 108 (90 BASE) MCG/ACT inhaler, Inhale into the lungs every 4 (four) hours as needed. , Disp: , Rfl:  .  aspirin 81 MG  tablet, Take 81 mg by mouth daily. , Disp: , Rfl:  .  BIOTIN PO, Take by mouth daily. Reported on 02/05/2016, Disp: , Rfl:  .  Blood Glucose Monitoring Suppl (ACCU-CHEK AVIVA PLUS) w/Device KIT, Use to check blood sugar for type 2 diabetes., Disp: 1 kit, Rfl: 0 .  Cholecalciferol 2000 UNITS CAPS, Take by mouth daily. , Disp: , Rfl:  .  Dulaglutide (TRULICITY) 1.61 WR/6.0AV SOPN, Inject 0.75 mg into the skin once a week., Disp: 4 pen, Rfl: 2 .  EPINEPHrine 0.3 mg/0.3 mL IJ SOAJ injection, , Disp: , Rfl:  .  estradiol (ESTRACE) 0.5 MG tablet, TAKE 1  TABLET BY MOUTH EVERY DAY, Disp: 30 tablet, Rfl: 11 .  furosemide (LASIX) 20 MG tablet, Take 1 tablet (20 mg total) by mouth daily as needed for edema., Disp: 30 tablet, Rfl: 2 .  gabapentin (NEURONTIN) 600 MG tablet, TAKE 1 TABLET BY MOUTH THREE TIMES DAILY, Disp: 90 tablet, Rfl: 4 .  levocetirizine (XYZAL) 5 MG tablet, Take 5 mg by mouth every evening. , Disp: , Rfl:  .  lovastatin (MEVACOR) 40 MG tablet, Take 1 tablet by mouth at bedtime, Disp: , Rfl:  .  lubiprostone (AMITIZA) 24 MCG capsule, Take 1 capsule (24 mcg total) by mouth 2 (two) times daily with a meal for 30 days., Disp: 60 capsule, Rfl: 0 .  mometasone (ELOCON) 0.1 % cream, APP EXT AA BID, Disp: , Rfl:  .  spironolactone (ALDACTONE) 100 MG tablet, TAKE 1 TABLET BY MOUTH ONCE DAILY FOR BLOOD PRESSURE AND SWELLING, Disp: 30 tablet, Rfl: 11 .  Cyanocobalamin (VITAMIN B 12 PO), Take by mouth daily., Disp: , Rfl:   Review of Systems  Constitutional: Negative for appetite change, chills, fatigue and fever.  Respiratory: Negative for chest tightness and shortness of breath.   Cardiovascular: Negative for chest pain and palpitations.  Gastrointestinal: Negative for abdominal pain, nausea and vomiting.  Skin: Positive for rash.  Neurological: Negative for dizziness and weakness.    Social History   Tobacco Use  . Smoking status: Former Smoker    Packs/day: 0.75    Years: 22.00    Pack years: 16.50    Types: Cigarettes    Last attempt to quit: 07/27/1985    Years since quitting: 33.4  . Smokeless tobacco: Never Used  . Tobacco comment: smoked as teenager  Substance Use Topics  . Alcohol use: No      Objective:   BP 127/72 (BP Location: Right Arm, Patient Position: Sitting, Cuff Size: Large)   Pulse 71   Temp 98.4 F (36.9 C) (Oral)   Resp 16   Wt 157 lb (71.2 kg)   SpO2 96% Comment: room air  BMI 30.66 kg/m  Vitals:   12/20/18 0953  BP: 127/72  Pulse: 71  Resp: 16  Temp: 98.4 F (36.9 C)  TempSrc: Oral  SpO2:  96%  Weight: 157 lb (71.2 kg)     Physical Exam   General Appearance:    Alert, cooperative, no distress  Eyes:    PERRL, conjunctiva/corneas clear, EOM's intact       Lungs:     Clear to auscultation bilaterally, respirations unlabored  Heart:    Regular rate and rhythm  Neurologic:   Awake, alert, oriented x 3. No apparent focal neurological           defect.       Results for orders placed or performed in visit on 12/20/18  POCT HgB A1C  Result Value Ref Range   Hemoglobin A1C 7.5 (A) 4.0 - 5.6 %   HbA1c POC (<> result, manual entry)     HbA1c, POC (prediabetic range)     HbA1c, POC (controlled diabetic range)     Est. average glucose Bld gHb Est-mCnc 169         Assessment & Plan    1. Type 2 diabetes mellitus with other specified complication, without long-term current use of insulin (HCC) A1c much better on Trulicity, but is causing a few days of nausea each week, unclear if this is related to itching, which he has had intermittently in the past. Will try changing to Ozempic 0.70m weekly.  - POCT HgB A1C  2. Memory difficulties Try donepezil 591mhs.   Follow up 3 months.     DoLelon HuhMD  [facility]

## 2018-12-20 NOTE — Patient Instructions (Signed)
.   Please review the attached list of medications and notify my office if there are any errors.   . Please bring all of your medications to every appointment so we can make sure that our medication list is the same as yours.   

## 2018-12-22 DIAGNOSIS — J3089 Other allergic rhinitis: Secondary | ICD-10-CM | POA: Diagnosis not present

## 2018-12-22 DIAGNOSIS — J301 Allergic rhinitis due to pollen: Secondary | ICD-10-CM | POA: Diagnosis not present

## 2018-12-27 DIAGNOSIS — J3081 Allergic rhinitis due to animal (cat) (dog) hair and dander: Secondary | ICD-10-CM | POA: Diagnosis not present

## 2018-12-27 DIAGNOSIS — J3089 Other allergic rhinitis: Secondary | ICD-10-CM | POA: Diagnosis not present

## 2018-12-27 DIAGNOSIS — J301 Allergic rhinitis due to pollen: Secondary | ICD-10-CM | POA: Diagnosis not present

## 2019-01-03 DIAGNOSIS — J301 Allergic rhinitis due to pollen: Secondary | ICD-10-CM | POA: Diagnosis not present

## 2019-01-03 DIAGNOSIS — J3089 Other allergic rhinitis: Secondary | ICD-10-CM | POA: Diagnosis not present

## 2019-01-12 DIAGNOSIS — J301 Allergic rhinitis due to pollen: Secondary | ICD-10-CM | POA: Diagnosis not present

## 2019-01-12 DIAGNOSIS — J3089 Other allergic rhinitis: Secondary | ICD-10-CM | POA: Diagnosis not present

## 2019-01-16 DIAGNOSIS — I10 Essential (primary) hypertension: Secondary | ICD-10-CM | POA: Diagnosis not present

## 2019-01-16 DIAGNOSIS — R0789 Other chest pain: Secondary | ICD-10-CM | POA: Diagnosis not present

## 2019-01-16 DIAGNOSIS — E782 Mixed hyperlipidemia: Secondary | ICD-10-CM | POA: Diagnosis not present

## 2019-01-16 DIAGNOSIS — E119 Type 2 diabetes mellitus without complications: Secondary | ICD-10-CM | POA: Diagnosis not present

## 2019-01-17 DIAGNOSIS — J3089 Other allergic rhinitis: Secondary | ICD-10-CM | POA: Diagnosis not present

## 2019-01-17 DIAGNOSIS — J301 Allergic rhinitis due to pollen: Secondary | ICD-10-CM | POA: Diagnosis not present

## 2019-01-19 DIAGNOSIS — J3089 Other allergic rhinitis: Secondary | ICD-10-CM | POA: Diagnosis not present

## 2019-01-19 DIAGNOSIS — J301 Allergic rhinitis due to pollen: Secondary | ICD-10-CM | POA: Diagnosis not present

## 2019-01-24 DIAGNOSIS — J3089 Other allergic rhinitis: Secondary | ICD-10-CM | POA: Diagnosis not present

## 2019-01-24 DIAGNOSIS — J301 Allergic rhinitis due to pollen: Secondary | ICD-10-CM | POA: Diagnosis not present

## 2019-01-26 DIAGNOSIS — J301 Allergic rhinitis due to pollen: Secondary | ICD-10-CM | POA: Diagnosis not present

## 2019-01-26 DIAGNOSIS — J3089 Other allergic rhinitis: Secondary | ICD-10-CM | POA: Diagnosis not present

## 2019-01-26 DIAGNOSIS — J3081 Allergic rhinitis due to animal (cat) (dog) hair and dander: Secondary | ICD-10-CM | POA: Diagnosis not present

## 2019-02-02 DIAGNOSIS — J3089 Other allergic rhinitis: Secondary | ICD-10-CM | POA: Diagnosis not present

## 2019-02-02 DIAGNOSIS — J301 Allergic rhinitis due to pollen: Secondary | ICD-10-CM | POA: Diagnosis not present

## 2019-02-07 DIAGNOSIS — J3089 Other allergic rhinitis: Secondary | ICD-10-CM | POA: Diagnosis not present

## 2019-02-07 DIAGNOSIS — J301 Allergic rhinitis due to pollen: Secondary | ICD-10-CM | POA: Diagnosis not present

## 2019-02-14 ENCOUNTER — Other Ambulatory Visit: Payer: Self-pay | Admitting: Family Medicine

## 2019-02-14 DIAGNOSIS — J301 Allergic rhinitis due to pollen: Secondary | ICD-10-CM | POA: Diagnosis not present

## 2019-02-14 DIAGNOSIS — J3089 Other allergic rhinitis: Secondary | ICD-10-CM | POA: Diagnosis not present

## 2019-02-21 DIAGNOSIS — J301 Allergic rhinitis due to pollen: Secondary | ICD-10-CM | POA: Diagnosis not present

## 2019-02-21 DIAGNOSIS — J3089 Other allergic rhinitis: Secondary | ICD-10-CM | POA: Diagnosis not present

## 2019-02-23 ENCOUNTER — Telehealth: Payer: Self-pay | Admitting: Family Medicine

## 2019-02-23 DIAGNOSIS — M25511 Pain in right shoulder: Secondary | ICD-10-CM

## 2019-02-23 NOTE — Telephone Encounter (Signed)
Patient called stating that her R shoulder pain has come back with a vengeance and it's hurting really bad. Had the same issue in 2016.   She wants to see Dr. Mack Guise again ASAP.

## 2019-02-28 DIAGNOSIS — J301 Allergic rhinitis due to pollen: Secondary | ICD-10-CM | POA: Diagnosis not present

## 2019-02-28 DIAGNOSIS — J3089 Other allergic rhinitis: Secondary | ICD-10-CM | POA: Diagnosis not present

## 2019-03-02 ENCOUNTER — Ambulatory Visit: Payer: Medicare Other | Admitting: Gastroenterology

## 2019-03-03 ENCOUNTER — Ambulatory Visit: Payer: Medicare Other | Admitting: Gastroenterology

## 2019-03-07 ENCOUNTER — Telehealth: Payer: Self-pay | Admitting: Family Medicine

## 2019-03-07 ENCOUNTER — Other Ambulatory Visit: Payer: Self-pay

## 2019-03-07 ENCOUNTER — Encounter: Payer: Self-pay | Admitting: Gastroenterology

## 2019-03-07 ENCOUNTER — Ambulatory Visit (INDEPENDENT_AMBULATORY_CARE_PROVIDER_SITE_OTHER): Payer: Medicare Other | Admitting: Gastroenterology

## 2019-03-07 VITALS — BP 125/70 | HR 67 | Temp 97.1°F | Resp 16 | Ht 60.0 in | Wt 161.0 lb

## 2019-03-07 DIAGNOSIS — L501 Idiopathic urticaria: Secondary | ICD-10-CM | POA: Diagnosis not present

## 2019-03-07 DIAGNOSIS — J4530 Mild persistent asthma, uncomplicated: Secondary | ICD-10-CM | POA: Diagnosis not present

## 2019-03-07 DIAGNOSIS — K219 Gastro-esophageal reflux disease without esophagitis: Secondary | ICD-10-CM

## 2019-03-07 DIAGNOSIS — J3089 Other allergic rhinitis: Secondary | ICD-10-CM | POA: Diagnosis not present

## 2019-03-07 DIAGNOSIS — K5904 Chronic idiopathic constipation: Secondary | ICD-10-CM | POA: Diagnosis not present

## 2019-03-07 DIAGNOSIS — L209 Atopic dermatitis, unspecified: Secondary | ICD-10-CM | POA: Diagnosis not present

## 2019-03-07 DIAGNOSIS — E1169 Type 2 diabetes mellitus with other specified complication: Secondary | ICD-10-CM

## 2019-03-07 MED ORDER — SITAGLIPTIN PHOSPHATE 50 MG PO TABS: 50.0000 mg | ORAL_TABLET | Freq: Every day | ORAL | 3 refills | Status: DC

## 2019-03-07 NOTE — Telephone Encounter (Signed)
Stop Ozempic and try Januvia 50 mg daily

## 2019-03-07 NOTE — Telephone Encounter (Signed)
Please review. Dr. Caryn Section patient.

## 2019-03-07 NOTE — Telephone Encounter (Signed)
Pt called regarding her Metforman caused GI problems had to change to Trulicity.  Trulicity caused skin rash and pt was recently switched to Semaglutide,0.25 or 0.5MG /DOS, (OZEMPIC, 0.25 OR 0.5 MG/DOSE,) 2 MG/1.5ML SOPN. Pt now is having skin rash as well.  Pt needing a call back to discuss what she needs to do next or next step.  Please advise.  Thanks, [employer]

## 2019-03-07 NOTE — Telephone Encounter (Signed)
Advised patient as below. Medication was sent into the pharmacy.  

## 2019-03-07 NOTE — Progress Notes (Signed)
Sharon Darby, MD [address]  [place]  [place], [place] [address]  Main: [phone]  Fax: [phone] Pager: [phone]   Primary Care Physician: Birdie Sons, MD  Primary Gastroenterologist:  Dr. Cephas Nelson  Chief Complaint  Patient presents with  . Follow-up    HPI: Sharon Nelson is a 68 y.o. female is here for follow-up of GERD and constipation.  She is taking Amitiza every 2 days.  She does report heartburn daily.  She is currently not on PPI.  She has been eating very little throughout the day and having full meals(sort of heavy meals per patient) for dinner around 7 or 8 PM.  And, she notices heartburn right after she eats.  She also feels bloated.  Her bowels are irregular.  She realizes that she need to change her eating habits.  Otherwise, she does not have any concerns today.  Her hemoglobin A1c is improving.  Current Outpatient Medications  Medication Sig Dispense Refill  . ACCU-CHEK AVIVA PLUS test strip USE TO TEST ONCE DAILY 100 each 4  . ADVAIR HFA 115-21 MCG/ACT inhaler     . albuterol (PROAIR HFA) 108 (90 BASE) MCG/ACT inhaler Inhale into the lungs every 4 (four) hours as needed.     Marland Kitchen aspirin 81 MG tablet Take 81 mg by mouth daily.     Marland Kitchen BIOTIN PO Take by mouth daily. Reported on 02/05/2016    . Blood Glucose Monitoring Suppl (ACCU-CHEK AVIVA PLUS) w/Device KIT Use to check blood sugar for type 2 diabetes. 1 kit 0  . Cyanocobalamin (VITAMIN B 12 PO) Take by mouth daily.    Marland Kitchen donepezil (ARICEPT) 5 MG tablet Take 1 tablet (5 mg total) by mouth at bedtime. 30 tablet 3  . EPINEPHrine 0.3 mg/0.3 mL IJ SOAJ injection     . estradiol (ESTRACE) 0.5 MG tablet TAKE 1 TABLET BY MOUTH EVERY DAY 30 tablet 11  . furosemide (LASIX) 20 MG tablet TAKE 1 TABLET(20 MG) BY MOUTH DAILY AS NEEDED FOR SWELLING 30 tablet 5  . gabapentin (NEURONTIN) 600 MG tablet TAKE 1 TABLET BY MOUTH THREE TIMES DAILY 90 tablet 4  . levocetirizine (XYZAL) 5 MG tablet Take 5 mg by  mouth every evening.     . lovastatin (MEVACOR) 40 MG tablet Take 1 tablet by mouth at bedtime    . lubiprostone (AMITIZA) 24 MCG capsule TK 1 C PO BID    . mometasone (ELOCON) 0.1 % cream APP EXT AA BID    . Semaglutide,0.25 or 0.5MG/DOS, (OZEMPIC, 0.25 OR 0.5 MG/DOSE,) 2 MG/1.5ML SOPN Inject 0.5 mg into the skin once a week. 4 pen 5  . spironolactone (ALDACTONE) 100 MG tablet TAKE 1 TABLET BY MOUTH ONCE DAILY FOR BLOOD PRESSURE AND SWELLING 30 tablet 11  . Aspirin Buf,CaCarb-MgCarb-MgO, 81 MG TABS Take by mouth.    . Cholecalciferol 2000 UNITS CAPS Take by mouth daily.      No current facility-administered medications for this visit.     Allergies as of 03/07/2019 - Review Complete 03/07/2019  Allergen Reaction Noted  . Lisinopril Anaphylaxis and Swelling 01/25/2015  . Iodinated diagnostic agents Rash 01/25/2015  . Cinnamon Swelling 03/29/2015  . Ertugliflozin Itching 06/14/2018  . Omeprazole  11/29/2018  . Peanut oil Swelling 03/29/2015  . Penicillins Swelling 01/25/2015  . Sulfa antibiotics Swelling 01/25/2015  . Verapamil hcl er Swelling 03/29/2015  . Amitriptyline Rash 09/08/2018  . Milk protein Rash 03/29/2015  . Pioglitazone Rash 09/08/2018  .  Sglt2 inhibitors Rash 07/08/2018    NSAIDs: None  Antiplts/Anticoagulants/Anti thrombotics: None  GI Procedures: She had a colonoscopy at [facility] more than 5 years ago and she was told that she has polyps, never had an EGD EGD and colonoscopy 05/04/2018 - Normal duodenal bulb and second portion of the duodenum. - Small hiatal hernia. - Normal stomach. Biopsied. - Esophagogastric landmarks identified. - Normal gastroesophageal junction and esophagus. - One diminutive polyp in the cecum, removed with a cold biopsy forceps. Resected and retrieved. - Six 4 to 5 mm polyps in the rectum, in the descending colon and in the transverse colon, removed with a cold snare. Resected and retrieved. - The distal rectum and anal verge  are normal on retroflexion view.    ROS:  General: Negative for anorexia, weight loss, fever, chills, fatigue, weakness. ENT: Negative for hoarseness, difficulty swallowing , nasal congestion. CV: Negative for chest pain, angina, palpitations, dyspnea on exertion, peripheral edema.  Respiratory: Negative for dyspnea at rest, dyspnea on exertion, cough, sputum, wheezing.  GI: See history of present illness. GU:  Negative for dysuria, hematuria, urinary incontinence, urinary frequency, nocturnal urination.  Endo: Negative for unusual weight change.    Physical Examination:   BP 125/70 (BP Location: Left Arm, Patient Position: Sitting, Cuff Size: Normal)   Pulse 67   Temp (!) 97.1 F (36.2 C)   Resp 16   Ht 5' (1.524 m)   Wt 161 lb (73 kg)   BMI 31.44 kg/m   General: Well-nourished, well-developed in no acute distress.  Eyes: No icterus. Conjunctivae pink. Mouth: Oropharyngeal mucosa moist and pink , no lesions erythema or exudate. Lungs: Clear to auscultation bilaterally. Non-labored. Heart: Regular rate and rhythm, no murmurs rubs or gallops.  Abdomen: Bowel sounds are normal, nontender, nondistended, no hepatosplenomegaly or masses, no hernia , no rebound or guarding.   Extremities: No lower extremity edema. No clubbing or deformities. Neuro: Alert and oriented x 3.  Grossly intact. Skin: Warm and dry, no jaundice.   Psych: Alert and cooperative, normal mood and affect.   Imaging Studies: No results found.  Assessment and Plan:   Sharon Nelson is a 68 y.o. female diabetes, overweight seen for follow-up of GERD and constipation.  Discussed with her about importance of antireflux measures and healthy lifestyle in order to control her symptoms.  She acknowledges the same and will try to work on it.  Advised her to take Amitiza daily  Follow up as needed   Dr Sherri Sear, MD

## 2019-03-08 DIAGNOSIS — M7501 Adhesive capsulitis of right shoulder: Secondary | ICD-10-CM | POA: Diagnosis not present

## 2019-03-14 DIAGNOSIS — J301 Allergic rhinitis due to pollen: Secondary | ICD-10-CM | POA: Diagnosis not present

## 2019-03-14 DIAGNOSIS — J3089 Other allergic rhinitis: Secondary | ICD-10-CM | POA: Diagnosis not present

## 2019-03-21 DIAGNOSIS — J301 Allergic rhinitis due to pollen: Secondary | ICD-10-CM | POA: Diagnosis not present

## 2019-03-21 DIAGNOSIS — J3089 Other allergic rhinitis: Secondary | ICD-10-CM | POA: Diagnosis not present

## 2019-03-27 ENCOUNTER — Encounter: Payer: Self-pay | Admitting: Family Medicine

## 2019-03-27 ENCOUNTER — Other Ambulatory Visit: Payer: Self-pay

## 2019-03-27 ENCOUNTER — Ambulatory Visit (INDEPENDENT_AMBULATORY_CARE_PROVIDER_SITE_OTHER): Payer: Medicare Other | Admitting: Family Medicine

## 2019-03-27 VITALS — BP 128/60 | HR 66 | Temp 96.9°F | Resp 16 | Wt 160.0 lb

## 2019-03-27 DIAGNOSIS — M542 Cervicalgia: Secondary | ICD-10-CM | POA: Diagnosis not present

## 2019-03-27 DIAGNOSIS — E1169 Type 2 diabetes mellitus with other specified complication: Secondary | ICD-10-CM | POA: Diagnosis not present

## 2019-03-27 DIAGNOSIS — Z23 Encounter for immunization: Secondary | ICD-10-CM

## 2019-03-27 DIAGNOSIS — G43019 Migraine without aura, intractable, without status migrainosus: Secondary | ICD-10-CM

## 2019-03-27 DIAGNOSIS — G500 Trigeminal neuralgia: Secondary | ICD-10-CM | POA: Diagnosis not present

## 2019-03-27 LAB — POCT GLYCOSYLATED HEMOGLOBIN (HGB A1C)
Est. average glucose Bld gHb Est-mCnc: 203
Hemoglobin A1C: 8.7 % — AB (ref 4.0–5.6)

## 2019-03-27 MED ORDER — GABAPENTIN 600 MG PO TABS: 600.0000 mg | ORAL_TABLET | Freq: Every day | ORAL | 0 refills | Status: DC

## 2019-03-27 MED ORDER — JANUMET XR 50-1000 MG PO TB24: 1.0000 | ORAL_TABLET | Freq: Every day | ORAL | 0 refills | Status: DC

## 2019-03-27 NOTE — Progress Notes (Signed)
Patient: Sharon Nelson Female    DOB: 1951-04-11   68 y.o.   MRN: [id number] Visit Date: 03/27/2019  Today's Provider: [name], MD   Chief Complaint  Patient presents with  . Follow-up   Subjective:     HPI  Hypertension, follow-up:  BP Readings from Last 3 Encounters:  03/27/19 128/60  03/07/19 125/70  12/20/18 127/72    She was last seen for hypertension 6 months ago.  BP at that visit was 120/60. Management since that visit includes no changes. She reports good compliance with treatment. She is not having side effects.  She is exercising. She is not adherent to low salt diet.   Outside blood pressures are not being checked. She is experiencing none.  Patient denies chest pain, chest pressure/discomfort, claudication, dyspnea, exertional chest pressure/discomfort, fatigue, irregular heart beat, lower extremity edema, near-syncope, orthopnea, palpitations, paroxysmal nocturnal dyspnea, syncope and tachypnea.   Cardiovascular risk factors include advanced age (older than 73 for men, 42 for women), diabetes mellitus and hypertension.  Use of agents associated with hypertension: NSAIDS.     Weight trend: fluctuating a bit Wt Readings from Last 3 Encounters:  03/27/19 160 lb (72.6 kg)  03/07/19 161 lb (73 kg)  12/20/18 157 lb (71.2 kg)    Current diet: well balanced  ------------------------------------------------------------------------  Diabetes Mellitus Type II, Follow-up:   Lab Results  Component Value Date   HGBA1C 7.5 (A) 12/20/2018   HGBA1C 8.7 (H) 09/08/2018   HGBA1C 8.5 (A) 04/18/2018    Last seen for diabetes 3 months ago.  Management since then includes changing from Ozempic to Januvia due to itching and rash. Rash has improved but not resolved. No other adverse effects since starting Januvia, but sugars are running higher.  She reports good compliance with treatment. She is not having side effects.  Current symptoms include none and  have been stable. Home blood sugar records: fasting range: 170-180  Episodes of hypoglycemia? no   Current insulin regiment: Is not on insulin Most Recent Eye Exam: UTD Weight trend: fluctuating a bit Prior visit with dietician: No Current exercise: walking Current diet habits: well balanced  Pertinent Labs:    Component Value Date/Time   CHOL 198 09/08/2018 1012   TRIG 168 (H) 09/08/2018 1012   HDL 47 09/08/2018 1012   LDLCALC 117 (H) 09/08/2018 1012   CREATININE 0.91 11/19/2018 0836   CREATININE 1.04 02/12/2014 0353    Wt Readings from Last 3 Encounters:  03/27/19 160 lb (72.6 kg)  03/07/19 161 lb (73 kg)  12/20/18 157 lb (71.2 kg)    ------------------------------------------------------------------------  Follow up for Memory difficulties:  The patient was last seen for this 3 months ago. Changes made at last visit include starting a trial of Donepezil 21m at night.  She reports good compliance with treatment. She feels that condition is Improved. She is not having side effects.   ------------------------------------------------------------------------------------  Allergies  Allergen Reactions  . Lisinopril Anaphylaxis and Swelling    WAS ON LIFE SUPPORT   . Iodinated Diagnostic Agents Rash    IV dye, red dye Other Reaction: feeling of heat  . Cinnamon Swelling    Other reaction(s): Swollen lips  . Ertugliflozin Itching  . Omeprazole     Broke out into rash  . Peanut Oil Swelling  . Penicillins Swelling  . Sulfa Antibiotics Swelling  . Verapamil Hcl Er Swelling  . Amitriptyline Rash  . Milk Protein Rash  . Pioglitazone Rash  .  Sglt2 Inhibitors Rash    Same reaction to Wilder Glade and Steglatro     Current Outpatient Medications:  .  ACCU-CHEK AVIVA PLUS test strip, USE TO TEST ONCE DAILY, Disp: 100 each, Rfl: 4 .  ADVAIR HFA 115-21 MCG/ACT inhaler, , Disp: , Rfl:  .  albuterol (PROAIR HFA) 108 (90 BASE) MCG/ACT inhaler, Inhale into the lungs every  4 (four) hours as needed. , Disp: , Rfl:  .  aspirin 81 MG tablet, Take 81 mg by mouth daily. , Disp: , Rfl:  .  Blood Glucose Monitoring Suppl (ACCU-CHEK AVIVA PLUS) w/Device KIT, Use to check blood sugar for type 2 diabetes., Disp: 1 kit, Rfl: 0 .  Cholecalciferol 2000 UNITS CAPS, Take by mouth daily. , Disp: , Rfl:  .  donepezil (ARICEPT) 5 MG tablet, Take 1 tablet (5 mg total) by mouth at bedtime., Disp: 30 tablet, Rfl: 3 .  EPINEPHrine 0.3 mg/0.3 mL IJ SOAJ injection, , Disp: , Rfl:  .  estradiol (ESTRACE) 0.5 MG tablet, TAKE 1 TABLET BY MOUTH EVERY DAY, Disp: 30 tablet, Rfl: 11 .  furosemide (LASIX) 20 MG tablet, TAKE 1 TABLET(20 MG) BY MOUTH DAILY AS NEEDED FOR SWELLING, Disp: 30 tablet, Rfl: 5 .  gabapentin (NEURONTIN) 600 MG tablet, TAKE 1 TABLET BY MOUTH THREE TIMES DAILY, Disp: 90 tablet, Rfl: 4 .  levocetirizine (XYZAL) 5 MG tablet, Take 5 mg by mouth every evening. , Disp: , Rfl:  .  lovastatin (MEVACOR) 40 MG tablet, Take 1 tablet by mouth at bedtime, Disp: , Rfl:  .  lubiprostone (AMITIZA) 24 MCG capsule, TK 1 C PO BID, Disp: , Rfl:  .  mometasone (ELOCON) 0.1 % cream, APP EXT AA BID, Disp: , Rfl:  .  sitaGLIPtin (JANUVIA) 50 MG tablet, Take 1 tablet (50 mg total) by mouth daily., Disp: 30 tablet, Rfl: 3 .  spironolactone (ALDACTONE) 100 MG tablet, TAKE 1 TABLET BY MOUTH ONCE DAILY FOR BLOOD PRESSURE AND SWELLING, Disp: 30 tablet, Rfl: 11 .  BIOTIN PO, Take by mouth daily. Reported on 02/05/2016, Disp: , Rfl:  .  Cyanocobalamin (VITAMIN B 12 PO), Take by mouth daily., Disp: , Rfl:   Review of Systems  Constitutional: Negative for appetite change, chills, fatigue and fever.  Respiratory: Negative for chest tightness and shortness of breath.   Cardiovascular: Negative for chest pain and palpitations.  Gastrointestinal: Negative for abdominal pain, nausea and vomiting.  Neurological: Negative for dizziness and weakness.    Social History   Tobacco Use  . Smoking status: Former  Smoker    Packs/day: 0.75    Years: 22.00    Pack years: 16.50    Types: Cigarettes    Quit date: 07/27/1985    Years since quitting: 33.6  . Smokeless tobacco: Never Used  . Tobacco comment: smoked as teenager  Substance Use Topics  . Alcohol use: No      Objective:   BP 128/60 (BP Location: Left Arm, Patient Position: Sitting, Cuff Size: Large)   Pulse 66   Temp (!) 96.9 F (36.1 C) (Temporal)   Resp 16   Wt 160 lb (72.6 kg)   SpO2 98% Comment: room air  BMI 31.25 kg/m  Vitals:   03/27/19 1005  BP: 128/60  Pulse: 66  Resp: 16  Temp: (!) 96.9 F (36.1 C)  TempSrc: Temporal  SpO2: 98%  Weight: 160 lb (72.6 kg)  Body mass index is 31.25 kg/m.   Physical Exam   General Appearance:  Alert, cooperative, no distress  Eyes:    PERRL, conjunctiva/corneas clear, EOM's intact       Lungs:     Clear to auscultation bilaterally, respirations unlabored  Heart:    Normal heart rate. Normal rhythm.  2/6  MS:   All extremities are intact.   Neurologic:   Awake, alert, oriented x 3. No apparent focal neurological           defect.       Results for orders placed or performed in visit on 03/27/19  POCT HgB A1C  Result Value Ref Range   Hemoglobin A1C 8.7 (A) 4.0 - 5.6 %   HbA1c POC (<> result, manual entry)     HbA1c, POC (prediabetic range)     HbA1c, POC (controlled diabetic range)     Est. average glucose Bld gHb Est-mCnc 203        Assessment & Plan    1. Type 2 diabetes mellitus with other specified complication, without long-term current use of insulin (HCC) Rash improving off Ozempic, but sugars worsening. Not really clear if Ozempic was responsible for rash. She had previously taken metformin but stopped last year to GI side effects. Will start back at low dose in XR formulation in SitaGLIPtin-MetFORMIN HCl (JANUMET XR) 50-1000 MG TB24; Take 1 tablet by mouth daily.  Dispense: 28 tablet; Refill: 0 Will call patient in 3-4 weeks to get prescription if  tolerating.   2. Need for influenza vaccination  - Flu Vaccine QUAD High Dose(Fluad)  She would like to cut back on some of her meds and doesn't think she really needs the gabapentin so much. Will cut back to - gabapentin (NEURONTIN) 600 MG tablet; Take 1 tablet (600 mg total) by mouth at bedtime.  Dispense: 3 tablet; Refill: 0  The entirety of the information documented in the History of Present Illness, Review of Systems and Physical Exam were personally obtained by me. Portions of this information were initially documented by Meyer Cory, CMA and reviewed by me for thoroughness and accuracy.      [name], MD  [facility]

## 2019-03-27 NOTE — Patient Instructions (Addendum)
.   Please review the attached list of medications and notify my office if there are any errors.   . Please bring all of your medications to every appointment so we can make sure that our medication list is the same as yours.   . Reduce gabapentin to one capsule at bedtime only   Stop Januvia and start samples of Janumet 50/1000mg  one tablet a day (is OK to break in half if they are to big to swallow

## 2019-03-28 DIAGNOSIS — J301 Allergic rhinitis due to pollen: Secondary | ICD-10-CM | POA: Diagnosis not present

## 2019-03-28 DIAGNOSIS — J3089 Other allergic rhinitis: Secondary | ICD-10-CM | POA: Diagnosis not present

## 2019-04-04 DIAGNOSIS — J3089 Other allergic rhinitis: Secondary | ICD-10-CM | POA: Diagnosis not present

## 2019-04-04 DIAGNOSIS — J3081 Allergic rhinitis due to animal (cat) (dog) hair and dander: Secondary | ICD-10-CM | POA: Diagnosis not present

## 2019-04-04 DIAGNOSIS — J301 Allergic rhinitis due to pollen: Secondary | ICD-10-CM | POA: Diagnosis not present

## 2019-04-11 ENCOUNTER — Other Ambulatory Visit: Payer: Self-pay | Admitting: Gastroenterology

## 2019-04-18 DIAGNOSIS — J301 Allergic rhinitis due to pollen: Secondary | ICD-10-CM | POA: Diagnosis not present

## 2019-04-18 DIAGNOSIS — J3089 Other allergic rhinitis: Secondary | ICD-10-CM | POA: Diagnosis not present

## 2019-04-24 ENCOUNTER — Telehealth: Payer: Self-pay | Admitting: Family Medicine

## 2019-04-24 DIAGNOSIS — E1169 Type 2 diabetes mellitus with other specified complication: Secondary | ICD-10-CM

## 2019-04-24 NOTE — Telephone Encounter (Addendum)
Please check with patient and see if she is tolerating Janumet 50-1000mg  samples she was given at last o.v. if so will send in prescription

## 2019-04-24 NOTE — Telephone Encounter (Signed)
Patient was given samples of Janumet last month for diabetes, please call and see if she is tolerating this OK. If so will send prescription to pharmacy and schedule diabetes follow up in January

## 2019-04-25 DIAGNOSIS — M79604 Pain in right leg: Secondary | ICD-10-CM | POA: Diagnosis not present

## 2019-04-28 DIAGNOSIS — J3089 Other allergic rhinitis: Secondary | ICD-10-CM | POA: Diagnosis not present

## 2019-04-28 DIAGNOSIS — J301 Allergic rhinitis due to pollen: Secondary | ICD-10-CM | POA: Diagnosis not present

## 2019-04-28 NOTE — Telephone Encounter (Signed)
Left message to call back  

## 2019-05-02 DIAGNOSIS — J301 Allergic rhinitis due to pollen: Secondary | ICD-10-CM | POA: Diagnosis not present

## 2019-05-02 DIAGNOSIS — J3089 Other allergic rhinitis: Secondary | ICD-10-CM | POA: Diagnosis not present

## 2019-05-04 NOTE — Telephone Encounter (Signed)
LMTCB 05/04/2019  Thanks,   -Mickel Baas

## 2019-05-05 ENCOUNTER — Other Ambulatory Visit: Payer: Self-pay | Admitting: Orthopedic Surgery

## 2019-05-05 DIAGNOSIS — M79604 Pain in right leg: Secondary | ICD-10-CM

## 2019-05-05 MED ORDER — JANUMET XR 50-1000 MG PO TB24: 1.0000 | ORAL_TABLET | Freq: Every day | ORAL | 12 refills | Status: DC

## 2019-05-05 NOTE — Telephone Encounter (Signed)
What is qty and number of refills you would like sent in? Please review message below, thanks. KW

## 2019-05-05 NOTE — Telephone Encounter (Signed)
Pt states she is tolerating Janument well with no side effects.  Please send to [facility].   Thanks,   -Mickel Baas

## 2019-05-05 NOTE — Addendum Note (Signed)
Addended by: Birdie Sons on: 05/05/2019 04:40 PM   Modules accepted: Orders

## 2019-05-08 ENCOUNTER — Ambulatory Visit: Payer: Self-pay | Admitting: Family Medicine

## 2019-05-10 DIAGNOSIS — M7541 Impingement syndrome of right shoulder: Secondary | ICD-10-CM | POA: Diagnosis not present

## 2019-05-11 DIAGNOSIS — J301 Allergic rhinitis due to pollen: Secondary | ICD-10-CM | POA: Diagnosis not present

## 2019-05-11 DIAGNOSIS — J3089 Other allergic rhinitis: Secondary | ICD-10-CM | POA: Diagnosis not present

## 2019-05-12 ENCOUNTER — Telehealth: Payer: Self-pay

## 2019-05-12 NOTE — Telephone Encounter (Signed)
Patient advised.

## 2019-05-12 NOTE — Telephone Encounter (Signed)
Patient called and scheduled a follow up appointment for DM on Monday at 8:20am. She says you wanted her to follow up in 1 month since after her last  A1C from 03/27/2019 which increased from 7.5 to 8.7. From that visit she was started on a new medication (Janumet). Patient reports she has been tolerating this medication well, but her fasting sugars are still high averaging 150-200. Patient has been watching what she eats, but her sugars are still high. Should patient keep follow up appointment on Monday to repeat A1C? Or should she give the medication more time to work and follow up in January as previously scheduled? Please advise.

## 2019-05-12 NOTE — Progress Notes (Signed)
Patient: Sharon Nelson Female    DOB: 01-Aug-1950   68 y.o.   MRN: [id number] Visit Date: 05/15/2019  Today's Provider: [name], MD   Chief Complaint  Patient presents with  . Diabetes   Subjective:     HPI  Diabetes Mellitus Type II, Follow-up:   Lab Results  Component Value Date   HGBA1C 8.7 (A) 03/27/2019   HGBA1C 7.5 (A) 12/20/2018   HGBA1C 8.7 (H) 09/08/2018    Last seen for diabetes 6 weeks ago.  Management since then includes starting trial of Janumet (samples given). She reports good compliance with treatment. She is not having side effects.  Current symptoms include hyperglycemia and have been unchanged. Home blood sugar records: fasting range: 157- over 200  Episodes of hypoglycemia? no   Current insulin regiment: Is not on insulin Most Recent Eye Exam: UTD Weight trend: fluctuating a bit Prior visit with dietician: No Current exercise: walking Current diet habits: well balanced  Pertinent Labs:    Component Value Date/Time   CHOL 198 09/08/2018 1012   TRIG 168 (H) 09/08/2018 1012   HDL 47 09/08/2018 1012   LDLCALC 117 (H) 09/08/2018 1012   CREATININE 0.91 11/19/2018 0836   CREATININE 1.04 02/12/2014 0353    Wt Readings from Last 3 Encounters:  05/15/19 164 lb (74.4 kg)  03/27/19 160 lb (72.6 kg)  03/07/19 161 lb (73 kg)    ------------------------------------------------------------------------  Allergies  Allergen Reactions  . Lisinopril Anaphylaxis and Swelling    WAS ON LIFE SUPPORT   . Iodinated Diagnostic Agents Rash    IV dye, red dye Other Reaction: feeling of heat  . Cinnamon Swelling    Other reaction(s): Swollen lips  . Ertugliflozin Itching  . Omeprazole     Broke out into rash  . Peanut Oil Swelling  . Penicillins Swelling  . Sulfa Antibiotics Swelling  . Verapamil Hcl Er Swelling  . Amitriptyline Rash  . Milk Protein Rash  . Pioglitazone Rash  . Sglt2 Inhibitors Rash    Same reaction to Wilder Glade and  Steglatro     Current Outpatient Medications:  .  ACCU-CHEK AVIVA PLUS test strip, USE TO TEST ONCE DAILY, Disp: 100 each, Rfl: 4 .  ADVAIR HFA 115-21 MCG/ACT inhaler, , Disp: , Rfl:  .  albuterol (PROAIR HFA) 108 (90 BASE) MCG/ACT inhaler, Inhale into the lungs every 4 (four) hours as needed. , Disp: , Rfl:  .  AMITIZA 24 MCG capsule, TAKE 1 CAPSULE BY MOUTH TWICE DAILY, Disp: 60 capsule, Rfl: 1 .  aspirin 81 MG tablet, Take 81 mg by mouth daily. , Disp: , Rfl:  .  BIOTIN PO, Take by mouth daily. Reported on 02/05/2016, Disp: , Rfl:  .  Blood Glucose Monitoring Suppl (ACCU-CHEK AVIVA PLUS) w/Device KIT, Use to check blood sugar for type 2 diabetes., Disp: 1 kit, Rfl: 0 .  Cholecalciferol 2000 UNITS CAPS, Take by mouth daily. , Disp: , Rfl:  .  Cyanocobalamin (VITAMIN B 12 PO), Take by mouth daily., Disp: , Rfl:  .  donepezil (ARICEPT) 5 MG tablet, Take 1 tablet (5 mg total) by mouth at bedtime., Disp: 30 tablet, Rfl: 3 .  EPINEPHrine 0.3 mg/0.3 mL IJ SOAJ injection, , Disp: , Rfl:  .  estradiol (ESTRACE) 0.5 MG tablet, TAKE 1 TABLET BY MOUTH EVERY DAY, Disp: 30 tablet, Rfl: 11 .  furosemide (LASIX) 20 MG tablet, TAKE 1 TABLET(20 MG) BY MOUTH DAILY AS NEEDED FOR SWELLING,  Disp: 30 tablet, Rfl: 5 .  gabapentin (NEURONTIN) 600 MG tablet, Take 1 tablet (600 mg total) by mouth at bedtime., Disp: 3 tablet, Rfl: 0 .  levocetirizine (XYZAL) 5 MG tablet, Take 5 mg by mouth every evening. , Disp: , Rfl:  .  lovastatin (MEVACOR) 40 MG tablet, Take 1 tablet by mouth at bedtime, Disp: , Rfl:  .  mometasone (ELOCON) 0.1 % cream, APP EXT AA BID, Disp: , Rfl:  .  SitaGLIPtin-MetFORMIN HCl (JANUMET XR) 50-1000 MG TB24, Take 1 tablet by mouth daily., Disp: 30 tablet, Rfl: 12 .  spironolactone (ALDACTONE) 100 MG tablet, TAKE 1 TABLET BY MOUTH ONCE DAILY FOR BLOOD PRESSURE AND SWELLING, Disp: 30 tablet, Rfl: 11  Review of Systems  Constitutional: Negative for appetite change, chills, fatigue and fever.   Respiratory: Negative for chest tightness and shortness of breath.   Cardiovascular: Negative for chest pain and palpitations.  Gastrointestinal: Negative for abdominal pain, nausea and vomiting.  Neurological: Negative for dizziness and weakness.    Social History   Tobacco Use  . Smoking status: Former Smoker    Packs/day: 0.75    Years: 22.00    Pack years: 16.50    Types: Cigarettes    Quit date: 07/27/1985    Years since quitting: 33.8  . Smokeless tobacco: Never Used  . Tobacco comment: smoked as teenager  Substance Use Topics  . Alcohol use: No      Objective:   BP 128/62 (BP Location: Left Arm, Patient Position: Sitting, Cuff Size: Normal)   Pulse 63   Temp (!) 96.6 F (35.9 C) (Temporal)   Resp 16   Wt 164 lb (74.4 kg)   SpO2 99% Comment: room air  BMI 32.03 kg/m  Vitals:   05/15/19 0853  BP: 128/62  Pulse: 63  Resp: 16  Temp: (!) 96.6 F (35.9 C)  TempSrc: Temporal  SpO2: 99%  Weight: 164 lb (74.4 kg)  Body mass index is 32.03 kg/m.   Physical Exam  General appearance: Mildly obese female, cooperative and in no acute distress Head: Normocephalic, without obvious abnormality, atraumatic Respiratory: Respirations even and unlabored, normal respiratory rate Extremities: All extremities are intact.  Skin: Skin color, texture, turgor normal. No rashes seen  Psych: Appropriate mood and affect. Neurologic: Mental status: Alert, oriented to person, place, and time, thought content appropriate.  Results for orders placed or performed in visit on 05/15/19  POCT HgB A1C  Result Value Ref Range   Hemoglobin A1C 8.2 (A) 4.0 - 5.6 %   Est. average glucose Bld gHb Est-mCnc 189   POCT UA - Microalbumin  Result Value Ref Range   Microalbumin Ur, POC negative mg/L   Creatinine, POC n/a mg/dL   Albumin/Creatinine Ratio, Urine, POC n/a        Assessment & Plan    1. Type 2 diabetes mellitus with other specified complication, without long-term current use  of insulin (HCC) increase- SitaGLIPtin-MetFORMIN HCl (JANUMET XR) 50-1000 MG TB24; Take 1 tablet by mouth 2 (two) times daily.  Will check with her next week by phone and send new prescription for #60 if tolerating.   Follow up 4 months      Donald Fisher, MD  [facility] 

## 2019-05-12 NOTE — Telephone Encounter (Signed)
Yes, we need to check a1c again. Should keep appointment

## 2019-05-15 ENCOUNTER — Other Ambulatory Visit: Payer: Self-pay

## 2019-05-15 ENCOUNTER — Ambulatory Visit (INDEPENDENT_AMBULATORY_CARE_PROVIDER_SITE_OTHER): Payer: Medicare Other | Admitting: Family Medicine

## 2019-05-15 ENCOUNTER — Encounter: Payer: Self-pay | Admitting: Family Medicine

## 2019-05-15 VITALS — BP 128/62 | HR 63 | Temp 96.6°F | Resp 16 | Wt 164.0 lb

## 2019-05-15 DIAGNOSIS — E1169 Type 2 diabetes mellitus with other specified complication: Secondary | ICD-10-CM

## 2019-05-15 LAB — POCT GLYCOSYLATED HEMOGLOBIN (HGB A1C)
Est. average glucose Bld gHb Est-mCnc: 189
Hemoglobin A1C: 8.2 % — AB (ref 4.0–5.6)

## 2019-05-15 LAB — POCT UA - MICROALBUMIN: Microalbumin Ur, POC: NEGATIVE mg/L

## 2019-05-15 MED ORDER — JANUMET XR 50-1000 MG PO TB24: 1.0000 | ORAL_TABLET | Freq: Two times a day (BID) | ORAL | Status: DC

## 2019-05-15 NOTE — Patient Instructions (Signed)
.   Please review the attached list of medications and notify my office if there are any errors.   . Please bring all of your medications to every appointment so we can make sure that our medication list is the same as yours.   . Start taking Janumet twice a day instead of once a day. I'll call in new prescription next week so you have enough to last a month

## 2019-05-16 ENCOUNTER — Other Ambulatory Visit: Payer: Self-pay | Admitting: Family Medicine

## 2019-05-16 DIAGNOSIS — J3089 Other allergic rhinitis: Secondary | ICD-10-CM | POA: Diagnosis not present

## 2019-05-16 DIAGNOSIS — G43019 Migraine without aura, intractable, without status migrainosus: Secondary | ICD-10-CM

## 2019-05-16 DIAGNOSIS — J301 Allergic rhinitis due to pollen: Secondary | ICD-10-CM | POA: Diagnosis not present

## 2019-05-16 DIAGNOSIS — G500 Trigeminal neuralgia: Secondary | ICD-10-CM

## 2019-05-16 DIAGNOSIS — M542 Cervicalgia: Secondary | ICD-10-CM

## 2019-05-19 ENCOUNTER — Other Ambulatory Visit: Payer: Self-pay

## 2019-05-19 ENCOUNTER — Other Ambulatory Visit: Payer: Self-pay | Admitting: Orthopedic Surgery

## 2019-05-19 ENCOUNTER — Ambulatory Visit
Admission: RE | Admit: 2019-05-19 | Discharge: 2019-05-19 | Disposition: A | Discharge status: Home / Self Care | Payer: Self-pay | Source: Ambulatory Visit | Attending: Orthopedic Surgery | Admitting: Orthopedic Surgery

## 2019-05-19 ENCOUNTER — Encounter
Admission: RE | Admit: 2019-05-19 | Discharge: 2019-05-19 | Disposition: A | Discharge status: Home / Self Care | Payer: Medicare Other | Source: Ambulatory Visit | Attending: Orthopedic Surgery | Admitting: Orthopedic Surgery

## 2019-05-19 ENCOUNTER — Ambulatory Visit
Admission: RE | Admit: 2019-05-19 | Discharge: 2019-05-19 | Disposition: A | Discharge status: Home / Self Care | Payer: Medicare Other | Source: Ambulatory Visit | Attending: Orthopedic Surgery | Admitting: Orthopedic Surgery

## 2019-05-19 DIAGNOSIS — M7989 Other specified soft tissue disorders: Secondary | ICD-10-CM

## 2019-05-19 DIAGNOSIS — M79604 Pain in right leg: Secondary | ICD-10-CM

## 2019-05-19 DIAGNOSIS — M79661 Pain in right lower leg: Secondary | ICD-10-CM | POA: Diagnosis not present

## 2019-05-19 DIAGNOSIS — M25511 Pain in right shoulder: Secondary | ICD-10-CM

## 2019-05-19 DIAGNOSIS — M79662 Pain in left lower leg: Secondary | ICD-10-CM

## 2019-05-19 MED ORDER — TECHNETIUM TC 99M MEDRONATE IV KIT
22.0300 | PACK | Freq: Once | INTRAVENOUS | Status: AC | PRN
  Administered 2019-05-19: 09:00:00 22.03 via INTRAVENOUS

## 2019-05-21 ENCOUNTER — Telehealth: Payer: Self-pay | Admitting: Family Medicine

## 2019-05-21 DIAGNOSIS — E1169 Type 2 diabetes mellitus with other specified complication: Secondary | ICD-10-CM

## 2019-05-21 NOTE — Telephone Encounter (Signed)
Please check with patient and see if she is tolerating janumet since we increased to two a day last week. If so will send in new rx

## 2019-05-22 MED ORDER — JANUMET XR 50-1000 MG PO TB24: 2.0000 | ORAL_TABLET | Freq: Every day | ORAL | 12 refills | Status: DC

## 2019-05-22 NOTE — Telephone Encounter (Signed)
I called and spoke to patient. She says since increasing the medication, she had  an episode of stomach cramping on Saturday. This seems to have resolved now. Patient states that she has not broken out at all since increasing the medication. Is it ok to send prescription with qty 60, and 3 refills?   Pharmacy: [facility] ([address])

## 2019-05-23 DIAGNOSIS — J3089 Other allergic rhinitis: Secondary | ICD-10-CM | POA: Diagnosis not present

## 2019-05-23 DIAGNOSIS — J301 Allergic rhinitis due to pollen: Secondary | ICD-10-CM | POA: Diagnosis not present

## 2019-05-30 DIAGNOSIS — J3089 Other allergic rhinitis: Secondary | ICD-10-CM | POA: Diagnosis not present

## 2019-05-30 DIAGNOSIS — J301 Allergic rhinitis due to pollen: Secondary | ICD-10-CM | POA: Diagnosis not present

## 2019-06-01 DIAGNOSIS — M79661 Pain in right lower leg: Secondary | ICD-10-CM | POA: Diagnosis not present

## 2019-06-01 DIAGNOSIS — J3089 Other allergic rhinitis: Secondary | ICD-10-CM | POA: Diagnosis not present

## 2019-06-01 DIAGNOSIS — J301 Allergic rhinitis due to pollen: Secondary | ICD-10-CM | POA: Diagnosis not present

## 2019-06-01 DIAGNOSIS — Z96659 Presence of unspecified artificial knee joint: Secondary | ICD-10-CM | POA: Diagnosis not present

## 2019-06-09 DIAGNOSIS — J3089 Other allergic rhinitis: Secondary | ICD-10-CM | POA: Diagnosis not present

## 2019-06-09 DIAGNOSIS — J301 Allergic rhinitis due to pollen: Secondary | ICD-10-CM | POA: Diagnosis not present

## 2019-06-13 ENCOUNTER — Other Ambulatory Visit: Payer: Self-pay | Admitting: Family Medicine

## 2019-06-13 NOTE — Telephone Encounter (Signed)
Requested medication (s) are due for refill today:yes  Requested medication (s) are on the active medication list: yes  Last refill:  06/01/18  Rx expired 06/01/2019  Future visit scheduled: yes  Notes to clinic:  Expired RX  Patient due for mamogram    Requested Prescriptions  Pending Prescriptions Disp Refills   estradiol (ESTRACE) 0.5 MG tablet [Pharmacy Med Name: ESTRADIOL 0.5MG  TABLETS] 30 tablet 11    Sig: TAKE 1 TABLET BY MOUTH EVERY DAY     OB/GYN:  Estrogens Failed - 06/13/2019  4:05 PM      Failed - Mammogram is up-to-date per Health Maintenance      Passed - Last BP in normal range    BP Readings from Last 1 Encounters:  05/15/19 128/62         Passed - Valid encounter within last 12 months    Recent Outpatient Visits          4 weeks ago Type 2 diabetes mellitus with other specified complication, without long-term current use of insulin ([place])   [facility] Birdie Sons, MD   2 months ago Type 2 diabetes mellitus with other specified complication, without long-term current use of insulin ([place])   [facility] Birdie Sons, MD   5 months ago Type 2 diabetes mellitus with other specified complication, without long-term current use of insulin ([place])   [facility] Birdie Sons, MD   9 months ago Type 2 diabetes mellitus with other specified complication, without long-term current use of insulin ([place])   [facility] Birdie Sons, MD   1 year ago Type 2 diabetes mellitus with other specified complication, without long-term current use of insulin [facility])   [facility] Birdie Sons, MD      Future Appointments            In 2 months  [facility], [place]   In 3 months Fisher, Kirstie Peri, MD [facility], [place]

## 2019-06-15 DIAGNOSIS — Z20828 Contact with and (suspected) exposure to other viral communicable diseases: Secondary | ICD-10-CM | POA: Diagnosis not present

## 2019-06-16 DIAGNOSIS — J301 Allergic rhinitis due to pollen: Secondary | ICD-10-CM | POA: Diagnosis not present

## 2019-06-16 DIAGNOSIS — J3089 Other allergic rhinitis: Secondary | ICD-10-CM | POA: Diagnosis not present

## 2019-06-18 ENCOUNTER — Other Ambulatory Visit: Payer: Self-pay | Admitting: Family Medicine

## 2019-06-18 DIAGNOSIS — R413 Other amnesia: Secondary | ICD-10-CM

## 2019-06-27 DIAGNOSIS — J3089 Other allergic rhinitis: Secondary | ICD-10-CM | POA: Diagnosis not present

## 2019-06-27 DIAGNOSIS — J301 Allergic rhinitis due to pollen: Secondary | ICD-10-CM | POA: Diagnosis not present

## 2019-07-12 ENCOUNTER — Other Ambulatory Visit: Payer: Self-pay

## 2019-07-12 ENCOUNTER — Encounter: Payer: Self-pay | Admitting: Obstetrics and Gynecology

## 2019-07-12 ENCOUNTER — Ambulatory Visit (INDEPENDENT_AMBULATORY_CARE_PROVIDER_SITE_OTHER): Payer: Medicare Other | Admitting: Obstetrics and Gynecology

## 2019-07-12 VITALS — BP 131/68 | HR 72 | Ht 60.0 in | Wt 165.0 lb

## 2019-07-12 DIAGNOSIS — Z1231 Encounter for screening mammogram for malignant neoplasm of breast: Secondary | ICD-10-CM

## 2019-07-12 DIAGNOSIS — Z01419 Encounter for gynecological examination (general) (routine) without abnormal findings: Secondary | ICD-10-CM

## 2019-07-12 DIAGNOSIS — D229 Melanocytic nevi, unspecified: Secondary | ICD-10-CM

## 2019-07-12 NOTE — Patient Instructions (Signed)
[facility] [address] [place] [address]  [facility] [place]  [address] [place] [address]  Phone: [phone]  
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
## 2019-07-14 DIAGNOSIS — D1723 Benign lipomatous neoplasm of skin and subcutaneous tissue of right leg: Secondary | ICD-10-CM | POA: Diagnosis not present

## 2019-07-17 DIAGNOSIS — M12561 Traumatic arthropathy, right knee: Secondary | ICD-10-CM | POA: Insufficient documentation

## 2019-07-17 DIAGNOSIS — Z96651 Presence of right artificial knee joint: Secondary | ICD-10-CM | POA: Insufficient documentation

## 2019-07-18 ENCOUNTER — Inpatient Hospital Stay: Admission: RE | Admit: 2019-07-18 | Payer: Medicare Other | Source: Ambulatory Visit

## 2019-07-27 DIAGNOSIS — M7062 Trochanteric bursitis, left hip: Secondary | ICD-10-CM

## 2019-07-27 DIAGNOSIS — M545 Low back pain, unspecified: Secondary | ICD-10-CM | POA: Insufficient documentation

## 2019-07-27 HISTORY — DX: Trochanteric bursitis, left hip: M70.62

## 2019-07-31 ENCOUNTER — Ambulatory Visit: Payer: Self-pay

## 2019-07-31 ENCOUNTER — Encounter: Payer: Self-pay | Admitting: Emergency Medicine

## 2019-07-31 ENCOUNTER — Emergency Department: Payer: Medicare Other

## 2019-07-31 DIAGNOSIS — B029 Zoster without complications: Secondary | ICD-10-CM | POA: Insufficient documentation

## 2019-07-31 DIAGNOSIS — R072 Precordial pain: Secondary | ICD-10-CM | POA: Diagnosis not present

## 2019-07-31 DIAGNOSIS — I10 Essential (primary) hypertension: Secondary | ICD-10-CM | POA: Diagnosis not present

## 2019-07-31 DIAGNOSIS — Z87891 Personal history of nicotine dependence: Secondary | ICD-10-CM | POA: Diagnosis not present

## 2019-07-31 DIAGNOSIS — E119 Type 2 diabetes mellitus without complications: Secondary | ICD-10-CM | POA: Diagnosis not present

## 2019-07-31 DIAGNOSIS — Z79899 Other long term (current) drug therapy: Secondary | ICD-10-CM | POA: Insufficient documentation

## 2019-07-31 DIAGNOSIS — J45909 Unspecified asthma, uncomplicated: Secondary | ICD-10-CM | POA: Diagnosis not present

## 2019-07-31 DIAGNOSIS — R079 Chest pain, unspecified: Secondary | ICD-10-CM | POA: Diagnosis not present

## 2019-07-31 DIAGNOSIS — Z7982 Long term (current) use of aspirin: Secondary | ICD-10-CM | POA: Insufficient documentation

## 2019-07-31 LAB — BASIC METABOLIC PANEL
Anion gap: 12 (ref 5–15)
BUN: 12 mg/dL (ref 8–23)
CO2: 28 mmol/L (ref 22–32)
Calcium: 10.2 mg/dL (ref 8.9–10.3)
Chloride: 98 mmol/L (ref 98–111)
Creatinine, Ser: 0.98 mg/dL (ref 0.44–1.00)
GFR calc Af Amer: >60 mL/min (ref >60)
GFR calc non Af Amer: 58 mL/min — ABNORMAL LOW (ref >60)
Glucose, Bld: 168 mg/dL — ABNORMAL HIGH (ref 70–99)
Potassium: 4.1 mmol/L (ref 3.5–5.1)
Sodium: 138 mmol/L (ref 135–145)

## 2019-07-31 LAB — HEPATIC FUNCTION PANEL
ALT: 21 U/L (ref 0–44)
AST: 25 U/L (ref 15–41)
Albumin: 4 g/dL (ref 3.5–5.0)
Alkaline Phosphatase: 62 U/L (ref 38–126)
Bilirubin, Direct: <0.1 mg/dL (ref 0.0–0.2)
Total Bilirubin: 0.4 mg/dL (ref 0.3–1.2)
Total Protein: 7.8 g/dL (ref 6.5–8.1)

## 2019-07-31 LAB — CBC
HCT: 37.7 % (ref 36.0–46.0)
Hemoglobin: 11.7 g/dL — ABNORMAL LOW (ref 12.0–15.0)
MCH: 27 pg (ref 26.0–34.0)
MCHC: 31 g/dL (ref 30.0–36.0)
MCV: 86.9 fL (ref 80.0–100.0)
Platelets: 319 10*3/uL (ref 150–400)
RBC: 4.34 MIL/uL (ref 3.87–5.11)
RDW: 14.1 % (ref 11.5–15.5)
WBC: 7.9 10*3/uL (ref 4.0–10.5)
nRBC: 0 % (ref 0.0–0.2)

## 2019-07-31 LAB — TROPONIN I (HIGH SENSITIVITY)
Troponin I (High Sensitivity): <2 ng/L (ref <18)
Troponin I (High Sensitivity): <2 ng/L (ref <18)

## 2019-07-31 LAB — LIPASE, BLOOD: Lipase: 42 U/L (ref 11–51)

## 2019-07-31 NOTE — Telephone Encounter (Signed)
Pt. Reports she has been having chest pain x 3 days. Thought it was reflux. Today it woke her up and has been constant. Hurts to the right at her breast. Radiates into her back. Pain earlier was 8/10. 5/10 now. Has some shortness of breath and nausea. Will have someone drive her to the ED. Reason for Disposition . [1] Chest pain (or "angina") comes and goes AND [2] is happening more often (increasing in frequency) or getting worse (increasing in severity)  Answer Assessment - Initial Assessment Questions 1. LOCATION: "Where does it hurt?"       Right side under breast 2. RADIATION: "Does the pain go anywhere else?" (e.g., into neck, jaw, arms, back)     Back 3. ONSET: "When did the chest pain begin?" (Minutes, hours or days)      3 days ago 4. PATTERN "Does the pain come and go, or has it been constant since it started?"  "Does it get worse with exertion?"      Today it is constant 5. DURATION: "How long does it last" (e.g., seconds, minutes, hours)     Several hours 6. SEVERITY: "How bad is the pain?"  (e.g., Scale 1-10; mild, moderate, or severe)    - MILD (1-3): doesn't interfere with normal activities     - MODERATE (4-7): interferes with normal activities or awakens from sleep    - SEVERE (8-10): excruciating pain, unable to do any normal activities       Now 4-5 7. CARDIAC RISK FACTORS: "Do you have any history of heart problems or risk factors for heart disease?" (e.g., angina, prior heart attack; diabetes, high blood pressure, high cholesterol, smoker, or strong family history of heart disease)     Diabetic 8. PULMONARY RISK FACTORS: "Do you have any history of lung disease?"  (e.g., blood clots in lung, asthma, emphysema, birth control pills)     Asthma 9. CAUSE: "What do you think is causing the chest pain?"     Unsure 10. OTHER SYMPTOMS: "Do you have any other symptoms?" (e.g., dizziness, nausea, vomiting, sweating, fever, difficulty breathing, cough)       Some shortness of  breath, nausea 11. PREGNANCY: "Is there any chance you are pregnant?" "When was your last menstrual period?"       No  Protocols used: CHEST PAIN-A-AH

## 2019-07-31 NOTE — ED Triage Notes (Signed)
Pt c/o right sided chest pain, under the breast since last Wednesday. Eating worsens the pain. Pt reports pain is intermittent. Denies fever, SOB, N/V.

## 2019-07-31 NOTE — Telephone Encounter (Signed)
FYI, from [facility]

## 2019-08-01 ENCOUNTER — Emergency Department
Admission: EM | Admit: 2019-08-01 | Discharge: 2019-08-01 | Disposition: A | Discharge status: Home / Self Care | Payer: Medicare Other | Attending: Emergency Medicine | Admitting: Emergency Medicine

## 2019-08-01 DIAGNOSIS — B029 Zoster without complications: Secondary | ICD-10-CM | POA: Diagnosis not present

## 2019-08-01 MED ORDER — KETOROLAC TROMETHAMINE 60 MG/2ML IM SOLN
30.0000 mg | Freq: Once | INTRAMUSCULAR | Status: AC
  Administered 2019-08-01: 30 mg via INTRAMUSCULAR

## 2019-08-01 MED ORDER — LIDOCAINE 5 % EX PTCH: 1.0000 | MEDICATED_PATCH | Freq: Two times a day (BID) | CUTANEOUS | 0 refills | Status: AC

## 2019-08-01 MED ORDER — ACETAMINOPHEN 500 MG PO TABS
1000.0000 mg | ORAL_TABLET | Freq: Once | ORAL | Status: AC
  Administered 2019-08-01: 1000 mg via ORAL
  Filled 2019-08-01: qty 2

## 2019-08-01 MED ORDER — ONDANSETRON 4 MG PO TBDP
ORAL_TABLET | ORAL | Status: AC
  Filled 2019-08-01: qty 1

## 2019-08-01 MED ORDER — ONDANSETRON HCL 4 MG/2ML IJ SOLN
4.0000 mg | Freq: Once | INTRAMUSCULAR | Status: DC
  Filled 2019-08-01: qty 2

## 2019-08-01 MED ORDER — PREDNISONE 20 MG PO TABS: 40.0000 mg | ORAL_TABLET | Freq: Every day | ORAL | 0 refills | Status: AC

## 2019-08-01 MED ORDER — VALACYCLOVIR HCL 1 G PO TABS: 1000.0000 mg | ORAL_TABLET | Freq: Three times a day (TID) | ORAL | 0 refills | Status: AC

## 2019-08-01 MED ORDER — KETOROLAC TROMETHAMINE 30 MG/ML IJ SOLN
15.0000 mg | Freq: Once | INTRAMUSCULAR | Status: DC
  Filled 2019-08-01: qty 1

## 2019-08-01 MED ORDER — HYDROCODONE-ACETAMINOPHEN 5-325 MG PO TABS: 1.0000 | ORAL_TABLET | Freq: Four times a day (QID) | ORAL | 0 refills | Status: DC | PRN

## 2019-08-01 MED ORDER — ONDANSETRON 4 MG PO TBDP
4.0000 mg | ORAL_TABLET | Freq: Once | ORAL | Status: AC
  Administered 2019-08-01: 04:00:00 4 mg via ORAL

## 2019-08-01 MED ORDER — LIDOCAINE 5 % EX PTCH
2.0000 | MEDICATED_PATCH | CUTANEOUS | Status: DC
  Administered 2019-08-01: 04:00:00 2 via TRANSDERMAL
  Filled 2019-08-01: qty 2

## 2019-08-01 MED ORDER — MORPHINE SULFATE (PF) 4 MG/ML IV SOLN
4.0000 mg | Freq: Once | INTRAVENOUS | Status: DC
  Filled 2019-08-01: qty 1

## 2019-08-01 NOTE — Discharge Instructions (Addendum)
Your rash and pain is due to shingles, which is a painful skin rash caused by the Chickenpox virus.  Take ibuprofen 400 mg for mild and moderate pain.  I've prescribed a stronger medicine to take as needed.  I've also prescribed an antiviral medicien (Valacyclovir) and steroids  I recommend taking an antacid while taking these medications, and taking with food, to help prevent stomach upset
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
## 2019-08-09 ENCOUNTER — Ambulatory Visit: Payer: Self-pay

## 2019-08-09 NOTE — Telephone Encounter (Signed)
Phone call from pt.  Stated she was in the ER last week for chest pain and diagnosed with shingles.  C/o continued right sided chest pain; "under the breast plate"  Stated the chest pain started on 1/3, and that was why she went to the ER on 1/5.  Stated that the chest pain has improved from how it had been.  Rated 3/10 with sitting still; and worsens with movement.  Denied radiation of chest pain to neck, jaw, shoulder or arm.  Denied shortness of breath.  Reported using Tylenol ES 2 tabs tid.  Reported Tylenol is not completely controlling the pain.  Hosp. F/u appt. Scheduled for 1/15.  Advised pt. to try and alternate taking Tylenol with Ibuprofen, every other dose, to possibly help with inflammation.  Will send note to Dr. Caryn Section to make him aware.  Pt. Encouraged to call back if symptoms worsen, or to go to the ER.  Verb. Understanding.    Reason for Disposition . Rash in same area as pain (may be described as "small blisters")    Reported onset of chest discomfort approx.1/3; seen in ER on 1/5; diagnosed with shingles.  Reported right sided chest pain is a little better than it was.  Taking Tylenol ES 2 tabs tid.  Rated pain at 3/10 if remains still; worsens with movement.  Answer Assessment - Initial Assessment Questions 1. LOCATION: "Where does it hurt?"       Right side of chest; under the right breast 2. RADIATION: "Does the pain go anywhere else?" (e.g., into neck, jaw, arms, back)     Denied radiation  3. ONSET: "When did the chest pain begin?" (Minutes, hours or days)      07/30/19 4. PATTERN "Does the pain come and go, or has it been constant since it started?"  "Does it get worse with exertion?"      Constant  5. DURATION: "How long does it last" (e.g., seconds, minutes, hours)     constant 6. SEVERITY: "How bad is the pain?"  (e.g., Scale 1-10; mild, moderate, or severe)    - MILD (1-3): doesn't interfere with normal activities     - MODERATE (4-7): interferes with normal activities  or awakens from sleep    - SEVERE (8-10): excruciating pain, unable to do any normal activities       3/10; increases with movement 7. CARDIAC RISK FACTORS: "Do you have any history of heart problems or risk factors for heart disease?" (e.g., angina, prior heart attack; diabetes, high blood pressure, high cholesterol, smoker, or strong family history of heart disease)     Diabetes and htn.  8. PULMONARY RISK FACTORS: "Do you have any history of lung disease?"  (e.g., blood clots in lung, asthma, emphysema, birth control pills)     *No Answer* 9. CAUSE: "What do you think is causing the chest pain?"     Shingles 10. OTHER SYMPTOMS: "Do you have any other symptoms?" (e.g., dizziness, nausea, vomiting, sweating, fever, difficulty breathing, cough)       Nausea, diarrhea, weakness; denied shortness of breath at present; c/o eyes itching;denied drainage 11. PREGNANCY: "Is there any chance you are pregnant?" "When was your last menstrual period?"       n/a  Protocols used: CHEST PAIN-A-AH

## 2019-08-11 ENCOUNTER — Other Ambulatory Visit: Payer: Self-pay

## 2019-08-11 ENCOUNTER — Ambulatory Visit (INDEPENDENT_AMBULATORY_CARE_PROVIDER_SITE_OTHER): Payer: Medicare Other | Admitting: Family Medicine

## 2019-08-11 ENCOUNTER — Encounter: Payer: Self-pay | Admitting: Family Medicine

## 2019-08-11 VITALS — BP 155/75 | HR 66 | Temp 96.8°F | Resp 20 | Wt 157.0 lb

## 2019-08-11 DIAGNOSIS — B0229 Other postherpetic nervous system involvement: Secondary | ICD-10-CM | POA: Diagnosis not present

## 2019-08-11 DIAGNOSIS — E559 Vitamin D deficiency, unspecified: Secondary | ICD-10-CM | POA: Diagnosis not present

## 2019-08-11 DIAGNOSIS — E7800 Pure hypercholesterolemia, unspecified: Secondary | ICD-10-CM

## 2019-08-11 DIAGNOSIS — E1169 Type 2 diabetes mellitus with other specified complication: Secondary | ICD-10-CM | POA: Diagnosis not present

## 2019-08-11 DIAGNOSIS — I10 Essential (primary) hypertension: Secondary | ICD-10-CM

## 2019-08-11 LAB — POCT GLYCOSYLATED HEMOGLOBIN (HGB A1C)
Est. average glucose Bld gHb Est-mCnc: 174
Hemoglobin A1C: 7.7 % — AB (ref 4.0–5.6)

## 2019-08-11 MED ORDER — HYDROCODONE-ACETAMINOPHEN 5-325 MG PO TABS: 1.0000 | ORAL_TABLET | Freq: Four times a day (QID) | ORAL | 0 refills | Status: DC | PRN

## 2019-08-11 NOTE — Patient Instructions (Addendum)
.   Please review the attached list of medications and notify my office if there are any errors.   . Please bring all of your medications to every appointment so we can make sure that our medication list is the same as yours.    Try over the counter Zostrix cream to apply to dried area of Shingrix  . Please go to the lab draw station in [address] on the second floor of [facility]  when you are fasting for 8 hours. Normal hours are 8:00am to 12:30pm and 1:30pm to 4:00pm Monday through Friday

## 2019-08-11 NOTE — Progress Notes (Signed)
Patient: Sharon Nelson Female    DOB: 05/09/1951   69 y.o.   MRN: [id number] Visit Date: 08/11/2019  Today's Provider: [name], MD   Chief Complaint  Patient presents with  . Follow-up   Subjective:     HPI  Follow up ER visit  Patient was seen in ER for right-sided chest pain on 08/01/2019. She was treated forHerpes Zoster without complication  . Treatment for this included treating with steroids, prednisone, analgesia and outpatient follow-up. Has run out of hydrocodone/apap Finished valacyclovir and prednisone She reports good compliance with treatment. She reports this condition is Improved. Still has pain in her chest.   ------------------------------------------------------------------------------------  She is also due for follow up of lipids and dm. She has appointment next week, but would like to address today. Is tolerating medications well. No numbness or tingling of extremities. Does not check sugars at home.    Allergies  Allergen Reactions  . Lisinopril Anaphylaxis and Swelling    WAS ON LIFE SUPPORT   . Iodinated Diagnostic Agents Rash    IV dye, red dye Other Reaction: feeling of heat  . Cinnamon Swelling    Other reaction(s): Swollen lips  . Ertugliflozin Itching  . Omeprazole     Broke out into rash  . Peanut Oil Swelling  . Penicillins Swelling  . Sulfa Antibiotics Swelling  . Verapamil Hcl Er Swelling  . Amitriptyline Rash  . Milk Protein Rash  . Pioglitazone Rash  . Sglt2 Inhibitors Rash    Same reaction to [place] and [place]     Current Outpatient Medications:  .  ACCU-CHEK AVIVA PLUS test strip, USE TO TEST ONCE DAILY, Disp: 100 each, Rfl: 4 .  ADVAIR HFA 115-21 MCG/ACT inhaler, , Disp: , Rfl:  .  albuterol (PROAIR HFA) 108 (90 BASE) MCG/ACT inhaler, Inhale into the lungs every 4 (four) hours as needed. , Disp: , Rfl:  .  AMITIZA 24 MCG capsule, TAKE 1 CAPSULE BY MOUTH TWICE DAILY, Disp: 60 capsule, Rfl: 1 .  aspirin 81  MG tablet, Take 81 mg by mouth daily. , Disp: , Rfl:  .  BIOTIN PO, Take by mouth daily. Reported on 02/05/2016, Disp: , Rfl:  .  Blood Glucose Monitoring Suppl (ACCU-CHEK AVIVA PLUS) w/Device KIT, Use to check blood sugar for type 2 diabetes., Disp: 1 kit, Rfl: 0 .  Cholecalciferol 2000 UNITS CAPS, Take by mouth daily. , Disp: , Rfl:  .  Cyanocobalamin (VITAMIN B 12 PO), Take by mouth daily., Disp: , Rfl:  .  donepezil (ARICEPT) 5 MG tablet, TAKE 1 TABLET(5 MG) BY MOUTH AT BEDTIME, Disp: 30 tablet, Rfl: 3 .  EPINEPHrine 0.3 mg/0.3 mL IJ SOAJ injection, , Disp: , Rfl:  .  estradiol (ESTRACE) 0.5 MG tablet, TAKE 1 TABLET BY MOUTH EVERY DAY, Disp: 30 tablet, Rfl: 1 .  furosemide (LASIX) 20 MG tablet, TAKE 1 TABLET(20 MG) BY MOUTH DAILY AS NEEDED FOR SWELLING, Disp: 30 tablet, Rfl: 5 .  gabapentin (NEURONTIN) 600 MG tablet, TAKE 1 TABLET BY MOUTH THREE TIMES DAILY, Disp: 90 tablet, Rfl: 4 .  levocetirizine (XYZAL) 5 MG tablet, Take 5 mg by mouth every evening. , Disp: , Rfl:  .  lovastatin (MEVACOR) 40 MG tablet, Take 1 tablet by mouth at bedtime, Disp: , Rfl:  .  mometasone (ELOCON) 0.1 % cream, APP EXT AA BID, Disp: , Rfl:  .  SitaGLIPtin-MetFORMIN HCl (JANUMET XR) 50-1000 MG TB24, Take 2 tablets by mouth  daily., Disp: 60 tablet, Rfl: 12 .  spironolactone (ALDACTONE) 100 MG tablet, TAKE 1 TABLET BY MOUTH ONCE DAILY FOR BLOOD PRESSURE AND SWELLING, Disp: 30 tablet, Rfl: 11 .  HYDROcodone-acetaminophen (NORCO/VICODIN) 5-325 MG tablet, Take 1-2 tablets by mouth every 6 (six) hours as needed for moderate pain or severe pain. (Patient not taking: Reported on 08/11/2019), Disp: 20 tablet, Rfl: 0  Review of Systems  Constitutional: Negative for appetite change, chills, fatigue and fever.  Respiratory: Negative for chest tightness and shortness of breath.   Cardiovascular: Positive for chest pain. Negative for palpitations.  Gastrointestinal: Negative for abdominal pain, nausea and vomiting.  Skin:  Positive for rash.  Neurological: Negative for dizziness and weakness.    Social History   Tobacco Use  . Smoking status: Former Smoker    Packs/day: 0.75    Years: 22.00    Pack years: 16.50    Types: Cigarettes    Quit date: 07/27/1985    Years since quitting: 34.0  . Smokeless tobacco: Never Used  . Tobacco comment: smoked as teenager  Substance Use Topics  . Alcohol use: No      Objective:   BP (!) 155/75 (BP Location: Left Arm)   Pulse 66   Temp (!) 96.8 F (36 C) (Temporal)   Resp 20   Wt 157 lb (71.2 kg)   SpO2 99% Comment: room air  BMI 30.66 kg/m  Vitals:   08/11/19 1407  BP: (!) 155/75  Pulse: 66  Resp: 20  Temp: (!) 96.8 F (36 C)  TempSrc: Temporal  SpO2: 99%  Weight: 157 lb (71.2 kg)  Body mass index is 30.66 kg/m.   Physical Exam  Mildly inflamed left lateral chest well, vesicles all scabbed over. No drainage.  Results for orders placed or performed in visit on 08/11/19  POCT HgB A1C  Result Value Ref Range   Hemoglobin A1C 7.7 (A) 4.0 - 5.6 %   Est. average glucose Bld gHb Est-mCnc 174        Assessment & Plan    1. Other postherpetic nervous system involvement Lesions now blistered over. Recommend OTC Zostrix. refill- HYDROcodone-acetaminophen (NORCO/VICODIN) 5-325 MG tablet; Take 1-2 tablets by mouth every 6 (six) hours as needed for moderate pain or severe pain.  Dispense: 30 tablet; Refill: 0  2. Essential (primary) hypertension Bp elevated today due to pain. Continue current medications.    3. Type 2 diabetes mellitus with other specified complication, without long-term current use of insulin (HCC) Improving a1c, Continue current medications.    4. Vitamin D deficiency  - Vitamin D (25 hydroxy)  5. Hypercholesteremia She is tolerating lovastatin well with no adverse effects.   - Comprehensive metabolic panel - Lipid panel (fasting)      [name], MD  [facility]
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
## 2019-08-16 ENCOUNTER — Ambulatory Visit (INDEPENDENT_AMBULATORY_CARE_PROVIDER_SITE_OTHER): Payer: Medicare Other

## 2019-08-16 ENCOUNTER — Encounter: Payer: Self-pay | Admitting: Adult Health

## 2019-08-16 ENCOUNTER — Other Ambulatory Visit: Payer: Self-pay

## 2019-08-16 ENCOUNTER — Telehealth: Payer: Self-pay

## 2019-08-16 ENCOUNTER — Other Ambulatory Visit: Payer: Self-pay | Admitting: Family Medicine

## 2019-08-16 ENCOUNTER — Telehealth (INDEPENDENT_AMBULATORY_CARE_PROVIDER_SITE_OTHER): Payer: Medicare Other | Admitting: Adult Health

## 2019-08-16 DIAGNOSIS — Z0000 Encounter for general adult medical examination without abnormal findings: Secondary | ICD-10-CM

## 2019-08-16 DIAGNOSIS — R110 Nausea: Secondary | ICD-10-CM | POA: Diagnosis not present

## 2019-08-16 DIAGNOSIS — R432 Parageusia: Secondary | ICD-10-CM | POA: Diagnosis not present

## 2019-08-16 DIAGNOSIS — R197 Diarrhea, unspecified: Secondary | ICD-10-CM | POA: Diagnosis not present

## 2019-08-16 DIAGNOSIS — B0229 Other postherpetic nervous system involvement: Secondary | ICD-10-CM

## 2019-08-16 DIAGNOSIS — Z20822 Contact with and (suspected) exposure to covid-19: Secondary | ICD-10-CM | POA: Insufficient documentation

## 2019-08-16 DIAGNOSIS — R0602 Shortness of breath: Secondary | ICD-10-CM | POA: Insufficient documentation

## 2019-08-16 DIAGNOSIS — R0789 Other chest pain: Secondary | ICD-10-CM | POA: Diagnosis not present

## 2019-08-16 NOTE — Telephone Encounter (Signed)
Can we see if we can add  her own now for a virtual for evaluation

## 2019-08-16 NOTE — Patient Instructions (Addendum)
[name] , Thank you for taking time to come for your Medicare Wellness Visit. I appreciate your ongoing commitment to your health goals. Please review the following plan we discussed and let me know if I can assist you in the future.   Screening recommendations/referrals: Colonoscopy: Up to date, due 04/2023 Mammogram: Currently due. Order on file- pt plans to schedule apt this year.  Bone Density: Up to date, due 09/28/21 Recommended yearly ophthalmology/optometry visit for glaucoma screening and checkup Recommended yearly dental visit for hygiene and checkup  Vaccinations: Influenza vaccine: Up to date Pneumococcal vaccine: Completed series Tdap vaccine: Up to date, due 04/2021 Shingles vaccine: Pt declines today.     Advanced directives: Please bring a copy of your POA (Power of Attorney) and/or Living Will to your next appointment.   Conditions/risks identified: Recommend to increase water intake to 6-8 8 oz glasses of water as tolerated.   Next appointment: 12/12/19 @ 9:40 AM with Dr Caryn Section. Declined scheduling an AWV for 2022 at this time.    Preventive Care 65 Years and Older, Female Preventive care refers to lifestyle choices and visits with your health care provider that can promote health and wellness. What does preventive care include?  A yearly physical exam. This is also called an annual well check.  Dental exams once or twice a year.  Routine eye exams. Ask your health care provider how often you should have your eyes checked.  Personal lifestyle choices, including:  Daily care of your teeth and gums.  Regular physical activity.  Eating a healthy diet.  Avoiding tobacco and drug use.  Limiting alcohol use.  Practicing safe sex.  Taking low-dose aspirin every day.  Taking vitamin and mineral supplements as recommended by your health care provider. What happens during an annual well check? The services and screenings done by your health care provider during  your annual well check will depend on your age, overall health, lifestyle risk factors, and family history of disease. Counseling  Your health care provider may ask you questions about your:  Alcohol use.  Tobacco use.  Drug use.  Emotional well-being.  Home and relationship well-being.  Sexual activity.  Eating habits.  History of falls.  Memory and ability to understand (cognition).  Work and work Statistician.  Reproductive health. Screening  You may have the following tests or measurements:  Height, weight, and BMI.  Blood pressure.  Lipid and cholesterol levels. These may be checked every 5 years, or more frequently if you are over 26 years old.  Skin check.  Lung cancer screening. You may have this screening every year starting at age 4 if you have a 30-pack-year history of smoking and currently smoke or have quit within the past 15 years.  Fecal occult blood test (FOBT) of the stool. You may have this test every year starting at age 27.  Flexible sigmoidoscopy or colonoscopy. You may have a sigmoidoscopy every 5 years or a colonoscopy every 10 years starting at age 31.  Hepatitis C blood test.  Hepatitis B blood test.  Sexually transmitted disease (STD) testing.  Diabetes screening. This is done by checking your blood sugar (glucose) after you have not eaten for a while (fasting). You may have this done every 1-3 years.  Bone density scan. This is done to screen for osteoporosis. You may have this done starting at age 67.  Mammogram. This may be done every 1-2 years. Talk to your health care provider about how often you should have  regular mammograms. Talk with your health care provider about your test results, treatment options, and if necessary, the need for more tests. Vaccines  Your health care provider may recommend certain vaccines, such as:  Influenza vaccine. This is recommended every year.  Tetanus, diphtheria, and acellular pertussis (Tdap,  Td) vaccine. You may need a Td booster every 10 years.  Zoster vaccine. You may need this after age 15.  Pneumococcal 13-valent conjugate (PCV13) vaccine. One dose is recommended after age 33.  Pneumococcal polysaccharide (PPSV23) vaccine. One dose is recommended after age 40. Talk to your health care provider about which screenings and vaccines you need and how often you need them. This information is not intended to replace advice given to you by your health care provider. Make sure you discuss any questions you have with your health care provider. Document Released: 08/09/2015 Document Revised: 04/01/2016 Document Reviewed: 05/14/2015 Elsevier Interactive Patient Education  2017 [place] Prevention in the Home Falls can cause injuries. They can happen to people of all ages. There are many things you can do to make your home safe and to help prevent falls. What can I do on the outside of my home?  Regularly fix the edges of walkways and driveways and fix any cracks.  Remove anything that might make you trip as you walk through a door, such as a raised step or threshold.  Trim any bushes or trees on the path to your home.  Use bright outdoor lighting.  Clear any walking paths of anything that might make someone trip, such as rocks or tools.  Regularly check to see if handrails are loose or broken. Make sure that both sides of any steps have handrails.  Any raised decks and porches should have guardrails on the edges.  Have any leaves, snow, or ice cleared regularly.  Use sand or salt on walking paths during winter.  Clean up any spills in your garage right away. This includes oil or grease spills. What can I do in the bathroom?  Use night lights.  Install grab bars by the toilet and in the tub and shower. Do not use towel bars as grab bars.  Use non-skid mats or decals in the tub or shower.  If you need to sit down in the shower, use a plastic, non-slip  stool.  Keep the floor dry. Clean up any water that spills on the floor as soon as it happens.  Remove soap buildup in the tub or shower regularly.  Attach bath mats securely with double-sided non-slip rug tape.  Do not have throw rugs and other things on the floor that can make you trip. What can I do in the bedroom?  Use night lights.  Make sure that you have a light by your bed that is easy to reach.  Do not use any sheets or blankets that are too big for your bed. They should not hang down onto the floor.  Have a firm chair that has side arms. You can use this for support while you get dressed.  Do not have throw rugs and other things on the floor that can make you trip. What can I do in the kitchen?  Clean up any spills right away.  Avoid walking on wet floors.  Keep items that you use a lot in easy-to-reach places.  If you need to reach something above you, use a strong step stool that has a grab bar.  Keep electrical cords out of the  way.  Do not use floor polish or wax that makes floors slippery. If you must use wax, use non-skid floor wax.  Do not have throw rugs and other things on the floor that can make you trip. What can I do with my stairs?  Do not leave any items on the stairs.  Make sure that there are handrails on both sides of the stairs and use them. Fix handrails that are broken or loose. Make sure that handrails are as long as the stairways.  Check any carpeting to make sure that it is firmly attached to the stairs. Fix any carpet that is loose or worn.  Avoid having throw rugs at the top or bottom of the stairs. If you do have throw rugs, attach them to the floor with carpet tape.  Make sure that you have a light switch at the top of the stairs and the bottom of the stairs. If you do not have them, ask someone to add them for you. What else can I do to help prevent falls?  Wear shoes that:  Do not have high heels.  Have rubber bottoms.  Are  comfortable and fit you well.  Are closed at the toe. Do not wear sandals.  If you use a stepladder:  Make sure that it is fully opened. Do not climb a closed stepladder.  Make sure that both sides of the stepladder are locked into place.  Ask someone to hold it for you, if possible.  Clearly mark and make sure that you can see:  Any grab bars or handrails.  First and last steps.  Where the edge of each step is.  Use tools that help you move around (mobility aids) if they are needed. These include:  Canes.  Walkers.  Scooters.  Crutches.  Turn on the lights when you go into a dark area. Replace any light bulbs as soon as they burn out.  Set up your furniture so you have a clear path. Avoid moving your furniture around.  If any of your floors are uneven, fix them.  If there are any pets around you, be aware of where they are.  Review your medicines with your doctor. Some medicines can make you feel dizzy. This can increase your chance of falling. Ask your doctor what other things that you can do to help prevent falls. This information is not intended to replace advice given to you by your health care provider. Make sure you discuss any questions you have with your health care provider. Document Released: 05/09/2009 Document Revised: 12/19/2015 Document Reviewed: 08/17/2014 Elsevier Interactive Patient Education  2017 [employer].

## 2019-08-16 NOTE — Telephone Encounter (Signed)
Requested medication (s) are due for refill today- yes  Requested medication (s) are on the active medication list -yes  Future visit scheduled -yes  Last refill: 07/14/19  Notes to clinic: Protocol for refill not met- sent to PCP for review  Requested Prescriptions  Pending Prescriptions Disp Refills   estradiol (ESTRACE) 0.5 MG tablet [Pharmacy Med Name: ESTRADIOL 0.5MG TABLETS] 30 tablet 1    Sig: TAKE 1 TABLET BY MOUTH EVERY DAY      OB/GYN:  Estrogens Failed - 08/16/2019 12:02 PM      Failed - Mammogram is up-to-date per Health Maintenance      Failed - Last BP in normal range    BP Readings from Last 1 Encounters:  08/11/19 (!) 155/75          Passed - Valid encounter within last 12 months    Recent Outpatient Visits           5 days ago Other postherpetic nervous system involvement   [facility] Birdie Sons, MD   3 months ago Type 2 diabetes mellitus with other specified complication, without long-term current use of insulin ([place])   [facility] Birdie Sons, MD   4 months ago Type 2 diabetes mellitus with other specified complication, without long-term current use of insulin ([place])   [facility] Birdie Sons, MD   7 months ago Type 2 diabetes mellitus with other specified complication, without long-term current use of insulin ([place])   [facility] Birdie Sons, MD   11 months ago Type 2 diabetes mellitus with other specified complication, without long-term current use of insulin ([place])   [place], Kirstie Peri, MD       Future Appointments             In 3 months Fisher, Kirstie Peri, MD [facility]                Requested Prescriptions  Pending Prescriptions Disp Refills   estradiol (ESTRACE) 0.5 MG tablet [Pharmacy Med Name: ESTRADIOL 0.5MG TABLETS] 30 tablet 1    Sig: TAKE 1 TABLET BY MOUTH EVERY DAY      OB/GYN:  Estrogens Failed -  08/16/2019 12:02 PM      Failed - Mammogram is up-to-date per Health Maintenance      Failed - Last BP in normal range    BP Readings from Last 1 Encounters:  08/11/19 (!) 155/75          Passed - Valid encounter within last 12 months    Recent Outpatient Visits           5 days ago Other postherpetic nervous system involvement   [facility] Birdie Sons, MD   3 months ago Type 2 diabetes mellitus with other specified complication, without long-term current use of insulin ([place])   [facility] Birdie Sons, MD   4 months ago Type 2 diabetes mellitus with other specified complication, without long-term current use of insulin [facility])   [facility] Birdie Sons, MD   7 months ago Type 2 diabetes mellitus with other specified complication, without long-term current use of insulin [facility])   [facility] Birdie Sons, MD   11 months ago Type 2 diabetes mellitus with other specified complication, without long-term current use of insulin [facility])   [place], Kirstie Peri, MD       Future  Appointments             In 3 months Fisher, Kirstie Peri, MD [facility], [place]

## 2019-08-16 NOTE — Telephone Encounter (Signed)
This is Dr. Maralyn Sago patient. Please review and advise.

## 2019-08-16 NOTE — Telephone Encounter (Signed)
Spoke with pt this morning to complete her telephonic AWV and she inquired about an antibiotic or anti-inflammatory medication for her Shingles pain. Pt is currently on Norco and states that it helps ease the pain but it comes right back. Pt also thinks the pain medication is causing diarrhea and possibly nausea. Pt states she did not sleep well last night due to the chest pain. Pt is also taking Tylenol PRN and is trying to avoid the Norco use as much as possible. The shingles pain is causing SOB and making it hard for her to move around. The pain is not as sharp as when she was admitted into hospital but it is still very bothersome and is now moving to her back. Pt has completed the Prednisone and Valtrex prescriptions that were d/c home. Pt never picked up the Lidoderm patch due to cost. Please advise, thank you.

## 2019-08-16 NOTE — Progress Notes (Signed)
Virtual Visit via Telephone Note  I connected with Sharon Nelson on 08/16/19 at  2:00 PM EST by telephone and verified that I am speaking with the correct person using two identifiers.  Location: Patient: at home  Provider: Provider: Provider's office at  [facility], [place].    I discussed the limitations, risks, security and privacy concerns of performing an evaluation and management service by telephone and the availability of in person appointments. I also discussed with the patient that there may be a patient responsible charge related to this service. The patient expressed understanding and agreed to proceed.   History of Present Illness: She was seen in the emergency room for chest pain and was diagnosed on 07/29/2019 with Shingles over right breast/ middle of chest to the her right side. Chest x ray was a negative in ER EKG was non specific changes. Troponin's were negative.  She was given Valtrex at ER visit.  She is on Neurontin 600 mg TID. She is taking all medications she was prescribed.    She saw Dr. Caryn Section on 1/15/201 for follow up.  See note reviewed  from that visit : 08/06/19    1. Other postherpetic nervous system involvement Lesions now blistered over. Recommend OTC Zostrix. refill- HYDROcodone-acetaminophen (NORCO/VICODIN) 5-325 MG tablet; Take 1-2 tablets by mouth every 6 (six) hours as needed for moderate pain or severe pain.  Dispense: 30 tablet; Refill: 0  Rates pain 3/10 with rest after taking Norco/ Vicodin and prior to that was 9/10 pain in right chest with radiation to lower back , with movement she hurts more. Lower back pain is new and it is on the left lower back near pant line. She denies any rash in that area.   She took a Norco about an hour ago and this relieved the pain she was having when she sits.  it hurts for her to talk and to take deep breaths she has pains. She can touch her chest and feel the pain but she feels this pain is deep  inside of her chest. She reports it affects her breathing when she tries to walk or talk or do any exertion.If she is sitting she feels a little better but reports she still has shortness of breath affecting her voice. She has a history of asthma. Denies any wheezing. She has used her asthma medications without relief.  She denies any urinary symptoms.   Denies any fever, chills, cough.  She has no taste, however reports this has been  present since she was diagnosed with shingles.   She started with Diarrhea last night 08/15/2019 and had one episode, today she has had diarrhea x 2 episodes. She denies any vomiting. She has mild nausea. She has no rectal bleeding or blood in stool. She reports her stools are green in color since onset of diarrhea. She does report eating a lot of vegetables the past few days.  She denies any abdominal pain or cramping.  Lipase was normal in the emergency room 07/31/2019   She was treated with Valtrex as well.  She did not use lidocaine patch or Zostrix cream.  She denies having a pulse oximetry. She denies any sick exposures.   Patient  denies any fever,chills, vomiting. She denies any leg pain or swelling.  History reported by patient of a cholecystectomy   Observations/Objective:   Patient is alert and oriented and responsive to questions Engages in conversation with provider. She has no video capabilities.  Patient  has intermittent choppy speech while on phone with provider, she sounds dyspneic, she has no way to do a video or to check her oxygen level.   Assessment and Plan: Needs face to face evaluation and given office protocol in light of Covid Pandemic she can not be seen in this office with shortness of breath.  Advised patient that given she is having new and worsening symptoms she should be evaluated in person so that vital signs and assessment can be performed with possible chest x ray. Reviewed she will also need Covid testing given loss of taste,  new and  shortness of breath and diarrhea/ nausea , new lower back pain and chest pain continues. She was not documented to have shortness of breath at ER visit or with Dr. Caryn Section. She did at both prior visits have chest pain likely due to Zoster.  Rule out cardiac or pulmonary etiology, underlying pancreatitis unlikely but should be ruled out.  She will go now to the [facility] or Emergency Room NOW for evaluation. She does not want to go to the ER, she is going to go to the Urgent Care in [facility] as they are seeing respiratory symptoms(advised to wear a mask)  and understands that the patient may be sent from urgent care to ER if warranted.   Patient verbalized understanding of all instructions given and denies any further questions at this time.    .Shortness of breath  Other postherpetic nervous system involvement  Nausea  Diarrhea, unspecified type  Loss of taste  Suspected COVID-19 virus infection  Other chest pain   Follow Up Instructions:    Advised patient call the office or your primary care doctor for an appointment if no improvement within 72 hours or if any symptoms change or worsen at any time  Advised ER or urgent Care if after hours or on weekend. Call 911 for emergency symptoms at any time.Patinet verbalized understanding of all instructions given/reviewed and treatment plan and has no further questions or concerns at this time.      I discussed the assessment and treatment plan with the patient. The patient was provided an opportunity to ask questions and all were answered. The patient agreed with the plan and demonstrated an understanding of the instructions.   The patient was advised to call back or seek an in-person evaluation if the symptoms worsen or if the condition fails to improve as anticipated.  I provided  20 minutes of non-face-to-face time during this encounter.   Marcille Buffy, FNP

## 2019-08-16 NOTE — Telephone Encounter (Signed)
See note

## 2019-08-17 ENCOUNTER — Other Ambulatory Visit: Payer: Self-pay

## 2019-08-17 ENCOUNTER — Ambulatory Visit
Admission: EM | Admit: 2019-08-17 | Discharge: 2019-08-17 | Disposition: A | Discharge status: Home / Self Care | Payer: Medicare Other | Attending: Family Medicine | Admitting: Family Medicine

## 2019-08-17 ENCOUNTER — Encounter: Payer: Self-pay | Admitting: Emergency Medicine

## 2019-08-17 ENCOUNTER — Ambulatory Visit: Payer: Medicare Other

## 2019-08-17 DIAGNOSIS — R0789 Other chest pain: Secondary | ICD-10-CM | POA: Diagnosis not present

## 2019-08-17 DIAGNOSIS — R079 Chest pain, unspecified: Secondary | ICD-10-CM | POA: Diagnosis not present

## 2019-08-17 DIAGNOSIS — Z20822 Contact with and (suspected) exposure to covid-19: Secondary | ICD-10-CM | POA: Diagnosis not present

## 2019-08-17 DIAGNOSIS — B0229 Other postherpetic nervous system involvement: Secondary | ICD-10-CM | POA: Diagnosis not present

## 2019-08-17 DIAGNOSIS — R0602 Shortness of breath: Secondary | ICD-10-CM | POA: Diagnosis not present

## 2019-08-17 LAB — COMPREHENSIVE METABOLIC PANEL
ALT: 21 U/L (ref 0–44)
AST: 19 U/L (ref 15–41)
Albumin: 3.8 g/dL (ref 3.5–5.0)
Alkaline Phosphatase: 49 U/L (ref 38–126)
Anion gap: 12 (ref 5–15)
BUN: 10 mg/dL (ref 8–23)
CO2: 25 mmol/L (ref 22–32)
Calcium: 9.1 mg/dL (ref 8.9–10.3)
Chloride: 98 mmol/L (ref 98–111)
Creatinine, Ser: 0.94 mg/dL (ref 0.44–1.00)
GFR calc Af Amer: >60 mL/min (ref >60)
GFR calc non Af Amer: >60 mL/min (ref >60)
Glucose, Bld: 144 mg/dL — ABNORMAL HIGH (ref 70–99)
Potassium: 3.7 mmol/L (ref 3.5–5.1)
Sodium: 135 mmol/L (ref 135–145)
Total Bilirubin: 0.2 mg/dL — ABNORMAL LOW (ref 0.3–1.2)
Total Protein: 7.8 g/dL (ref 6.5–8.1)

## 2019-08-17 LAB — CBC
HCT: 37.7 % (ref 36.0–46.0)
Hemoglobin: 11.9 g/dL — ABNORMAL LOW (ref 12.0–15.0)
MCH: 27.2 pg (ref 26.0–34.0)
MCHC: 31.6 g/dL (ref 30.0–36.0)
MCV: 86.1 fL (ref 80.0–100.0)
Platelets: 319 10*3/uL (ref 150–400)
RBC: 4.38 MIL/uL (ref 3.87–5.11)
RDW: 15.4 % (ref 11.5–15.5)
WBC: 8.4 10*3/uL (ref 4.0–10.5)
nRBC: 0 % (ref 0.0–0.2)

## 2019-08-17 LAB — LIPASE, BLOOD: Lipase: 24 U/L (ref 11–51)

## 2019-08-17 LAB — TROPONIN I (HIGH SENSITIVITY): Troponin I (High Sensitivity): 2 ng/L (ref <18)

## 2019-08-17 NOTE — ED Triage Notes (Signed)
Patient c/o right sided chest pain that has continued for 3 weeks since being diagnosed with shingles. She states she is taking the Hydrocodone for pain and can manage her pain with this but as soon as it wears off she's back in pain again. She states she can't take a deep breath because of the pain. Her PCP advised her to go to the ER or to [facility] to be evaluated.
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
## 2019-08-17 NOTE — Discharge Instructions (Addendum)
It was very nice seeing you today in clinic. Thank you for entrusting me with your care.   Work up was NEGATIVE. I suspect this all related to you shingles. This can persist for quite some time after the infection. I have spoken to your PCP office. I would recommend the following:  Lidoderm patches Over the counter Zostrix cream INCREASE your Gabapentin Add 1 x 300 mg pill today (900 mg once and 600 mg TWICE) Add 2 x 300 mg pills tomorrow if not too sleepy (900 mg TWICE and 600 mg ONCE) Add 3 x 300 mg pills the next day if not too sleepy (900 mg THREE TIMES a day) Continue Norco (pain pills) as prescribed by Dr. Caryn Section. Be careful with these as it makes you sleepy. It has the potential of making you even more sleepy with the increased gabapentin dose. Make arrangements to follow up with your regular doctor in 1 week for re-evaluation if not improving. If your symptoms/condition worsens, please seek follow up care either here or in the ER. Please remember, our [facility] providers are "right here with you" when you need [place]. Again, it was my pleasure to take care of you today. Thank you for choosing our clinic. I hope that you start to feel better quickly.   Honor Loh, MSN, APRN, FNP-C, CEN Advanced Practice Provider [facility]

## 2019-08-18 LAB — NOVEL CORONAVIRUS, NAA (HOSP ORDER, SEND-OUT TO REF LAB; TAT 18-24 HRS): SARS-CoV-2, NAA: NOT DETECTED

## 2019-08-21 ENCOUNTER — Other Ambulatory Visit: Payer: Self-pay | Admitting: Family Medicine

## 2019-08-21 ENCOUNTER — Telehealth: Payer: Self-pay | Admitting: Family Medicine

## 2019-08-21 DIAGNOSIS — E782 Mixed hyperlipidemia: Secondary | ICD-10-CM | POA: Diagnosis not present

## 2019-08-21 DIAGNOSIS — I10 Essential (primary) hypertension: Secondary | ICD-10-CM | POA: Diagnosis not present

## 2019-08-21 DIAGNOSIS — R0789 Other chest pain: Secondary | ICD-10-CM | POA: Diagnosis not present

## 2019-08-21 DIAGNOSIS — B0229 Other postherpetic nervous system involvement: Secondary | ICD-10-CM

## 2019-08-21 MED ORDER — HYDROCODONE-ACETAMINOPHEN 5-325 MG PO TABS: 1.0000 | ORAL_TABLET | Freq: Four times a day (QID) | ORAL | 0 refills | Status: AC | PRN

## 2019-08-21 MED ORDER — LIDOCAINE 5 % EX PTCH: 1.0000 | MEDICATED_PATCH | CUTANEOUS | 1 refills | Status: DC

## 2019-08-21 NOTE — Telephone Encounter (Signed)
Medication Refill - Medication: lidocaine (LIDODERM) 5 %  Pt stated she received these while in the ED. Pt stated this works better than the OTC cream.  Has the patient contacted their pharmacy? Yes.   (Agent: If no, request that the patient contact the pharmacy for the refill.) (Agent: If yes, when and what did the pharmacy advise?)  Preferred Pharmacy (with phone number or street name):  [facility] [id number] [address], [place] [facility] Phone:  [phone]  Fax:  [phone]       Agent: Please be advised that RX refills may take up to 3 business days. We ask that you follow-up with your pharmacy.

## 2019-08-21 NOTE — Telephone Encounter (Signed)
Pt has called asking for a rx for Lidoderm patches, this is not on her current med list. States she received in ED.

## 2019-08-21 NOTE — Telephone Encounter (Signed)
Patient called to check the status of her medication request.  Patient is in a lot of pain and would like the medication asap.

## 2019-08-21 NOTE — Telephone Encounter (Signed)
Medication Refill - Medication: HYDROcodone-acetaminophen (NORCO/VICODIN) 5-325 MG tablet Lidocane pain patch Prednisone  Has the patient contacted their pharmacy? no (Agent: If no, request that the patient contact the pharmacy for the refill.) (Agent: If yes, when and what did the pharmacy advise?)  Preferred Pharmacy (with phone number or street name):  [facility] [id number] [address], [place] [facility] Phone:  [phone]  Fax:  [phone]     Agent: Please be advised that RX refills may take up to 3 business days. We ask that you follow-up with your pharmacy.

## 2019-08-21 NOTE — Telephone Encounter (Signed)
Requested medication (s) are due for refill today: yes  Requested medication (s) are on the active medication list: yes  Last refill:  08/11/2019  Future visit scheduled: yes  Notes to clinic:  Patient also requesting Lidocaine pain patch Prednisone   Requested Prescriptions  Pending Prescriptions Disp Refills   HYDROcodone-acetaminophen (NORCO/VICODIN) 5-325 MG tablet 30 tablet 0    Sig: Take 1-2 tablets by mouth every 6 (six) hours as needed for moderate pain or severe pain.      Not Delegated - Analgesics:  Opioid Agonist Combinations Failed - 08/21/2019 12:34 PM      Failed - This refill cannot be delegated      Failed - Urine Drug Screen completed in last 360 days.      Passed - Valid encounter within last 6 months    Recent Outpatient Visits           5 days ago Shortness of breath   [facility] Flinchum, Kelby Aline, FNP   1 week ago Other postherpetic nervous system involvement   [facility] Birdie Sons, MD   3 months ago Type 2 diabetes mellitus with other specified complication, without long-term current use of insulin  [facility])   [facility] Birdie Sons, MD   4 months ago Type 2 diabetes mellitus with other specified complication, without long-term current use of insulin [facility])   [facility] Birdie Sons, MD   8 months ago Type 2 diabetes mellitus with other specified complication, without long-term current use of insulin [facility])   [facility] Birdie Sons, MD       Future Appointments             In 3 months Fisher, Kirstie Peri, MD [facility], [facility]

## 2019-08-21 NOTE — Addendum Note (Signed)
Addended by: Birdie Sons on: 08/21/2019 04:52 PM   Modules accepted: Orders

## 2019-08-29 DIAGNOSIS — J3089 Other allergic rhinitis: Secondary | ICD-10-CM | POA: Diagnosis not present

## 2019-08-29 DIAGNOSIS — J301 Allergic rhinitis due to pollen: Secondary | ICD-10-CM | POA: Diagnosis not present

## 2019-08-31 ENCOUNTER — Telehealth: Payer: Self-pay

## 2019-08-31 NOTE — Telephone Encounter (Signed)
Copied from [place] [phone]. Topic: General - Call Back - No Documentation >> Aug 31, 2019  2:43 PM Erick Blinks wrote: Reason for CRM: Pt wants a call back from a nurse  Best contact: [phone]

## 2019-08-31 NOTE — Telephone Encounter (Signed)
Patient states she is still having nerve pain in her chest from she shingles. She has only been taking 500mg  of Tylenol to help relieve the pain. Patient states the Tylenol is not helping and she wants to know if she should be taking an antiinflammatory to help ease off the pain. She still has some shakiness in her hands and didn't know if this could be related to the nerve pain also. Please advise  Pharmacy : [facility] [address]

## 2019-09-01 NOTE — Telephone Encounter (Signed)
Yes, an anti-inflammatory would probably work better than tylenol. She can take 3 x 200mg  ibuprofen every six hours in addition to her prescription pain medications.

## 2019-09-01 NOTE — Telephone Encounter (Signed)
Patient advised as below. Patient verbalizes understanding and is in agreement with treatment plan.  

## 2019-09-07 DIAGNOSIS — J4530 Mild persistent asthma, uncomplicated: Secondary | ICD-10-CM | POA: Diagnosis not present

## 2019-09-07 DIAGNOSIS — L501 Idiopathic urticaria: Secondary | ICD-10-CM | POA: Diagnosis not present

## 2019-09-07 DIAGNOSIS — J301 Allergic rhinitis due to pollen: Secondary | ICD-10-CM | POA: Diagnosis not present

## 2019-09-07 DIAGNOSIS — J3089 Other allergic rhinitis: Secondary | ICD-10-CM | POA: Diagnosis not present

## 2019-09-07 DIAGNOSIS — L2089 Other atopic dermatitis: Secondary | ICD-10-CM | POA: Diagnosis not present

## 2019-09-12 DIAGNOSIS — J301 Allergic rhinitis due to pollen: Secondary | ICD-10-CM | POA: Diagnosis not present

## 2019-09-12 DIAGNOSIS — J3089 Other allergic rhinitis: Secondary | ICD-10-CM | POA: Diagnosis not present

## 2019-09-15 ENCOUNTER — Ambulatory Visit: Payer: Self-pay | Admitting: Family Medicine

## 2019-09-19 ENCOUNTER — Other Ambulatory Visit: Payer: Self-pay | Admitting: Family Medicine

## 2019-09-19 DIAGNOSIS — J3089 Other allergic rhinitis: Secondary | ICD-10-CM | POA: Diagnosis not present

## 2019-09-19 DIAGNOSIS — J301 Allergic rhinitis due to pollen: Secondary | ICD-10-CM | POA: Diagnosis not present

## 2019-09-19 NOTE — Telephone Encounter (Signed)
Requested Prescriptions  Pending Prescriptions Disp Refills  . spironolactone (ALDACTONE) 100 MG tablet [Pharmacy Med Name: SPIRONOLACTONE 100MG  TABLETS] 30 tablet 11    Sig: TAKE 1 TABLET BY MOUTH EVERY DAY FOR BLOOD PRESSURE OR SWELLING     Cardiovascular: Diuretics - Aldosterone Antagonist Failed - 09/19/2019 12:52 PM      Failed - Last BP in normal range    BP Readings from Last 1 Encounters:  08/17/19 (!) 165/83         Passed - Cr in normal range and within 360 days    Creatinine  Date Value Ref Range Status  02/12/2014 1.04 0.60 - 1.30 mg/dL Final   Creatinine, Ser  Date Value Ref Range Status  08/17/2019 0.94 0.44 - 1.00 mg/dL Final   Creatinine, POC  Date Value Ref Range Status  05/15/2019 n/a mg/dL Final         Passed - K in normal range and within 360 days    Potassium  Date Value Ref Range Status  08/17/2019 3.7 3.5 - 5.1 mmol/L Final  02/12/2014 4.4 3.5 - 5.1 mmol/L Final         Passed - Na in normal range and within 360 days    Sodium  Date Value Ref Range Status  08/17/2019 135 135 - 145 mmol/L Final  09/08/2018 139 134 - 144 mmol/L Final  02/12/2014 142 136 - 145 mmol/L Final         Passed - Valid encounter within last 6 months    Recent Outpatient Visits          1 month ago Shortness of breath   [facility], Kelby Aline, FNP   1 month ago Other postherpetic nervous system involvement   [facility], MD   4 months ago Type 2 diabetes mellitus with other specified complication, without long-term current use of insulin ([place])   [facility] Birdie Sons, MD   5 months ago Type 2 diabetes mellitus with other specified complication, without long-term current use of insulin [facility])   [facility] Birdie Sons, MD   9 months ago Type 2 diabetes mellitus with other specified complication, without long-term current use of insulin [facility])   [place], Kirstie Peri, MD      Future Appointments            In 2 months Fisher, Kirstie Peri, MD [facility] [place]

## 2019-09-28 DIAGNOSIS — J3089 Other allergic rhinitis: Secondary | ICD-10-CM | POA: Diagnosis not present

## 2019-09-28 DIAGNOSIS — J301 Allergic rhinitis due to pollen: Secondary | ICD-10-CM | POA: Diagnosis not present

## 2019-10-06 DIAGNOSIS — J3089 Other allergic rhinitis: Secondary | ICD-10-CM | POA: Diagnosis not present

## 2019-10-06 DIAGNOSIS — J301 Allergic rhinitis due to pollen: Secondary | ICD-10-CM | POA: Diagnosis not present

## 2019-10-17 DIAGNOSIS — J3089 Other allergic rhinitis: Secondary | ICD-10-CM | POA: Diagnosis not present

## 2019-10-17 DIAGNOSIS — J301 Allergic rhinitis due to pollen: Secondary | ICD-10-CM | POA: Diagnosis not present

## 2019-11-14 DIAGNOSIS — J301 Allergic rhinitis due to pollen: Secondary | ICD-10-CM | POA: Diagnosis not present

## 2019-11-14 DIAGNOSIS — J3081 Allergic rhinitis due to animal (cat) (dog) hair and dander: Secondary | ICD-10-CM | POA: Diagnosis not present

## 2019-11-14 DIAGNOSIS — J3089 Other allergic rhinitis: Secondary | ICD-10-CM | POA: Diagnosis not present

## 2019-11-17 DIAGNOSIS — J3081 Allergic rhinitis due to animal (cat) (dog) hair and dander: Secondary | ICD-10-CM | POA: Diagnosis not present

## 2019-11-17 DIAGNOSIS — J301 Allergic rhinitis due to pollen: Secondary | ICD-10-CM | POA: Diagnosis not present

## 2019-11-17 DIAGNOSIS — J3089 Other allergic rhinitis: Secondary | ICD-10-CM | POA: Diagnosis not present

## 2019-11-21 ENCOUNTER — Ambulatory Visit
Admission: RE | Admit: 2019-11-21 | Discharge: 2019-11-21 | Disposition: A | Discharge status: Home / Self Care | Payer: Medicare Other | Source: Ambulatory Visit | Attending: Obstetrics and Gynecology | Admitting: Obstetrics and Gynecology

## 2019-11-21 ENCOUNTER — Other Ambulatory Visit: Payer: Self-pay

## 2019-11-21 DIAGNOSIS — J301 Allergic rhinitis due to pollen: Secondary | ICD-10-CM | POA: Diagnosis not present

## 2019-11-21 DIAGNOSIS — Z1231 Encounter for screening mammogram for malignant neoplasm of breast: Secondary | ICD-10-CM | POA: Insufficient documentation

## 2019-11-21 DIAGNOSIS — J3089 Other allergic rhinitis: Secondary | ICD-10-CM | POA: Diagnosis not present

## 2019-11-22 ENCOUNTER — Ambulatory Visit (INDEPENDENT_AMBULATORY_CARE_PROVIDER_SITE_OTHER): Payer: Medicare Other | Admitting: Physician Assistant

## 2019-11-22 ENCOUNTER — Encounter: Payer: Self-pay | Admitting: Physician Assistant

## 2019-11-22 VITALS — BP 138/75 | HR 73 | Temp 96.8°F | Resp 18 | Wt 155.0 lb

## 2019-11-22 DIAGNOSIS — L309 Dermatitis, unspecified: Secondary | ICD-10-CM

## 2019-11-22 MED ORDER — TRIAMCINOLONE ACETONIDE 0.1 % EX CREA: 1.0000 "application " | TOPICAL_CREAM | Freq: Two times a day (BID) | CUTANEOUS | 0 refills | Status: DC

## 2019-11-22 NOTE — Progress Notes (Signed)
I,[name],acting as a scribe for Trinna Post, PA-C.,have documented all relevant documentation on the behalf of Trinna Post, PA-C,as directed by  Trinna Post, PA-C while in the presence of Trinna Post, PA-C.   Established patient visit   Patient: Sharon Nelson   DOB: [birth year]   69 y.o. Female  MRN: [id number] Visit Date: 11/22/2019  Today's healthcare provider: Trinna Post, PA-C   Chief Complaint  Patient presents with  . Rash   Subjective    Rash This is a new problem. Episode onset: 1 week ago. The problem is unchanged. The affected locations include the chest, abdomen and torso (left side). The rash is characterized by itchiness and redness. Pertinent negatives include no fatigue, fever, shortness of breath or vomiting. Treatments tried: Elocon cream. The treatment provided mild relief.  Itching is worse at night. Has past history of allergies to several medications. She denies any changes in detergents, new pets,  or new medications or lotions. She received the second dose of  Moderna COVID vaccine 10/09/2019. Her skin starts with an itch, and a rash appears after she starts to scratch.        Medications: Outpatient Medications Prior to Visit  Medication Sig  . ACCU-CHEK AVIVA PLUS test strip USE TO TEST ONCE DAILY  . ADVAIR HFA 115-21 MCG/ACT inhaler Inhale 2 puffs into the lungs 2 (two) times daily.   Marland Kitchen albuterol (PROAIR HFA) 108 (90 BASE) MCG/ACT inhaler Inhale into the lungs every 4 (four) hours as needed.   . AMITIZA 24 MCG capsule TAKE 1 CAPSULE BY MOUTH TWICE DAILY  . aspirin 81 MG tablet Take 81 mg by mouth daily.   Marland Kitchen BIOTIN PO Take by mouth daily. When remembers  . Blood Glucose Monitoring Suppl (ACCU-CHEK AVIVA PLUS) w/Device KIT Use to check blood sugar for type 2 diabetes.  . Cholecalciferol 2000 UNITS CAPS Take by mouth daily.   . Cyanocobalamin (VITAMIN B 12 PO) Take by mouth daily. When remembers  . donepezil (ARICEPT)  5 MG tablet TAKE 1 TABLET(5 MG) BY MOUTH AT BEDTIME  . EPINEPHrine 0.3 mg/0.3 mL IJ SOAJ injection   . estradiol (ESTRACE) 0.5 MG tablet TAKE 1 TABLET BY MOUTH EVERY DAY  . furosemide (LASIX) 20 MG tablet TAKE 1 TABLET(20 MG) BY MOUTH DAILY AS NEEDED FOR SWELLING  . gabapentin (NEURONTIN) 600 MG tablet TAKE 1 TABLET BY MOUTH THREE TIMES DAILY  . HYDROcodone-acetaminophen (NORCO/VICODIN) 5-325 MG tablet Take 1-2 tablets by mouth every 6 (six) hours as needed for moderate pain or severe pain.  Marland Kitchen levocetirizine (XYZAL) 5 MG tablet Take 5 mg by mouth every evening.   . lovastatin (MEVACOR) 40 MG tablet Take 1 tablet by mouth at bedtime  . mometasone (ELOCON) 0.1 % cream APP EXT AA BID  . SitaGLIPtin-MetFORMIN HCl (JANUMET XR) 50-1000 MG TB24 Take 2 tablets by mouth daily.  Marland Kitchen spironolactone (ALDACTONE) 100 MG tablet TAKE 1 TABLET BY MOUTH EVERY DAY FOR BLOOD PRESSURE OR SWELLING  . [DISCONTINUED] lidocaine (LIDODERM) 5 % Place 1 patch onto the skin daily. For post herpetic neuralgia. Remove & Discard patch within 12 hours or as directed by MD (Patient not taking: Reported on 11/22/2019)   No facility-administered medications prior to visit.    Review of Systems  Constitutional: Negative.  Negative for appetite change, chills, fatigue and fever.  Respiratory: Negative.  Negative for chest tightness and shortness of breath.   Cardiovascular: Negative.  Negative for  chest pain and palpitations.  Gastrointestinal: Negative for abdominal pain, nausea and vomiting.  Skin: Positive for rash.       Itchy skin  Neurological: Negative.  Negative for dizziness and weakness.      Objective    BP 138/75 (BP Location: Left Arm, Patient Position: Sitting, Cuff Size: Normal)   Pulse 73   Temp (!) 96.8 F (36 C) (Temporal)   Resp 18   Wt 155 lb (70.3 kg)   SpO2 98% Comment: room air  BMI 30.27 kg/m    Physical Exam Constitutional:      Appearance: Normal appearance.  Skin:    General: Skin is  warm and dry.     Comments: Excoriations on torso and abdomen. Several pin point lesions that appear to be healing scratches.   Neurological:     Mental Status: She is alert and oriented to person, place, and time. Mental status is at baseline.  Psychiatric:        Mood and Affect: Mood normal.        Behavior: Behavior normal.       No results found for any visits on 11/22/19.  Assessment & Plan     1. Eczema, unspecified type Will try steroid cream. Not convinced this is due to drug allergy. If no better will have Dr. Caryn Section re-evaluate at upcoming follow up appointment to see if Janumet is the possible cause of itching. - triamcinolone cream (KENALOG) 0.1 %; Apply 1 application topically 2 (two) times daily.  Dispense: 30 g; Refill: 0  Return in about 20 days (around 12/12/2019), or if symptoms worsen or fail to improve.      ITrinna Post, PA-C, have reviewed all documentation for this visit. The documentation on 11/22/19 for the exam, diagnosis, procedures, and orders are all accurate and complete.    Paulene Floor  [facility] [phone] (phone) [phone] (fax)  [place]

## 2019-11-28 DIAGNOSIS — J3089 Other allergic rhinitis: Secondary | ICD-10-CM | POA: Diagnosis not present

## 2019-11-28 DIAGNOSIS — J301 Allergic rhinitis due to pollen: Secondary | ICD-10-CM | POA: Diagnosis not present

## 2019-11-30 ENCOUNTER — Telehealth: Payer: Self-pay | Admitting: Family Medicine

## 2019-11-30 NOTE — Chronic Care Management (AMB) (Signed)
  Chronic Care Management   Note  11/30/2019 Name: Sharon Nelson MRN: [id number] DOB: [birth year]  Sharon Nelson is a 69 y.o. year old female who is a primary care patient of Fisher, Kirstie Peri, MD. I reached out to Sharon Nelson by phone today in response to a referral sent by Ms. Loman [employer] Kassim's health plan.     Ms. Moffat was given information about Chronic Care Management services today including:  1. CCM service includes personalized support from designated clinical staff supervised by her physician, including individualized plan of care and coordination with other care providers 2. 24/7 contact phone numbers for assistance for urgent and routine care needs. 3. Service will only be billed when office clinical staff spend 20 minutes or more in a month to coordinate care. 4. Only one practitioner may furnish and bill the service in a calendar month. 5. The patient may stop CCM services at any time (effective at the end of the month) by phone call to the office staff. 6. The patient will be responsible for cost sharing (co-pay) of up to 20% of the service fee (after annual deductible is met).  Patient agreed to services and verbal consent obtained.   Follow up plan: Telephone appointment with care management team member scheduled for:12/18/2019  [place], [place], [place] [address] Direct Dial: [email or web address] Website: [email or web address]

## 2019-12-05 DIAGNOSIS — J3089 Other allergic rhinitis: Secondary | ICD-10-CM | POA: Diagnosis not present

## 2019-12-05 DIAGNOSIS — J301 Allergic rhinitis due to pollen: Secondary | ICD-10-CM | POA: Diagnosis not present

## 2019-12-07 DIAGNOSIS — E119 Type 2 diabetes mellitus without complications: Secondary | ICD-10-CM | POA: Diagnosis not present

## 2019-12-07 LAB — HM DIABETES EYE EXAM

## 2019-12-11 NOTE — Progress Notes (Signed)
I,[name],acting as a scribe for [name], MD.,have documented all relevant documentation on the behalf of [name], MD,as directed by  [name], MD while in the presence of [name], MD.  Established patient visit   Patient: Sharon Nelson   DOB: 05-03-1951   69 y.o. Female  MRN: [id number] Visit Date: 12/12/2019  Today's healthcare provider: [name], MD   Chief Complaint  Patient presents with  . Diabetes  . Hypertension  . Hyperlipidemia    Subjective    HPI Diabetes Mellitus Type II, Follow-up  Lab Results  Component Value Date   HGBA1C 7.0 (A) 12/12/2019   HGBA1C 7.7 (A) 08/11/2019   HGBA1C 8.2 (A) 05/15/2019   Wt Readings from Last 3 Encounters:  12/12/19 159 lb (72.1 kg)  11/22/19 155 lb (70.3 kg)  08/17/19 157 lb (71.2 kg)   Last seen for diabetes 4 months ago.  Management since then includes continuing same medication. She reports good compliance with treatment. She is not having side effects.  Symptoms: No fatigue No foot ulcerations  No appetite changes No nausea  No paresthesia of the feet  No polydipsia  No polyuria No visual disturbances   No vomiting     Home blood sugar records: random glucose average 140-150  Episodes of hypoglycemia? No    Current insulin regiment: None Most Recent Eye Exam: 12/07/2019 Current exercise: none Current diet habits: in general, an "unhealthy" diet  Pertinent Labs: Lab Results  Component Value Date   CHOL 198 09/08/2018   HDL 47 09/08/2018   LDLCALC 117 (H) 09/08/2018   TRIG 168 (H) 09/08/2018   CHOLHDL 4.2 09/08/2018   Lab Results  Component Value Date   NA 135 08/17/2019   K 3.7 08/17/2019   CREATININE 0.94 08/17/2019   GFRNONAA >60 08/17/2019   GFRAA >60 08/17/2019   GLUCOSE 144 (H) 08/17/2019     ---------------------------------------------------------------------------------------------------  Follow up for Eczema:  The patient was last seen for this  11/22/2019 by Carles Collet, PA-C. Changes made at last visit include starting a steroid cream Kenalog. If no better will re-evaluate at upcoming follow up appointment to see if Janumet is the possible cause of itching.  She reports good compliance with treatment. She feels that condition is changed in that it has dried up, but doesn't clear up completely. . Patient still has rash. States it has been off and on for the last month. Has year around allergies.   -----------------------------------------------------------------------------------------  Hypertension, follow-up  BP Readings from Last 3 Encounters:  12/12/19 (!) 150/70  11/22/19 138/75  08/17/19 (!) 165/83   Wt Readings from Last 3 Encounters:  12/12/19 159 lb (72.1 kg)  11/22/19 155 lb (70.3 kg)  08/17/19 157 lb (71.2 kg)     She was last seen for hypertension 4 months ago.  BP at that visit was 155/75. Management since that visit includes continuing same medication.  She reports good compliance with treatment. She is not having side effects.  She is following a Regular diet. She is not exercising. She does not smoke.  Use of agents associated with hypertension: none.   Outside blood pressures are not checked. Symptoms: No chest pain No chest pressure  No palpitations No syncope  No dyspnea No orthopnea  No paroxysmal nocturnal dyspnea No lower extremity edema   Pertinent labs: Lab Results  Component Value Date   CHOL 198 09/08/2018   HDL 47 09/08/2018   LDLCALC 117 (H) 09/08/2018  TRIG 168 (H) 09/08/2018   CHOLHDL 4.2 09/08/2018   Lab Results  Component Value Date   NA 135 08/17/2019   K 3.7 08/17/2019   CREATININE 0.94 08/17/2019   GFRNONAA >60 08/17/2019   GFRAA >60 08/17/2019   GLUCOSE 144 (H) 08/17/2019     The 10-year ASCVD risk score Mikey Bussing DC Jr., et al., 2013) is: 33.5%    --------------------------------------------------------------------------------------------------- Lipid/Cholesterol, Follow-up  Last lipid panel Other pertinent labs  Lab Results  Component Value Date   CHOL 198 09/08/2018   HDL 47 09/08/2018   LDLCALC 117 (H) 09/08/2018   TRIG 168 (H) 09/08/2018   CHOLHDL 4.2 09/08/2018   Lab Results  Component Value Date   ALT 21 08/17/2019   AST 19 08/17/2019   PLT 319 08/17/2019     She was last seen for this 4 months ago.  Management since that visit includes ordering labs ( patient did not complete lab work).   She reports good compliance with treatment. She is not having side effects.   Symptoms: No chest pain No chest pressure/discomfort  No dyspnea No lower extremity edema  No numbness or tingling of extremity No orthopnea  No palpitations No paroxysmal nocturnal dyspnea  No speech difficulty No syncope   Current diet: in general, an "unhealthy" diet Current exercise: none  The 10-year ASCVD risk score Mikey Bussing DC Jr., et al., 2013) is: 33.5%  --------------------------------------------------------------------------------------------------- Follow up for Vitamin D Deficiency:  The patient was last seen for this 4 months ago. Lab Results  Component Value Date   VD25OH 52.6 09/08/2018    Changes made at last visit include none; labs were ordered but not done.  She reports good compliance with treatment. She feels that condition is Unchanged. She is not having side effects.   ----------------------------------------------------------------------------------------- Still having trouble with short term memory despite starting donepezil     Medications: Outpatient Medications Prior to Visit  Medication Sig  . ACCU-CHEK AVIVA PLUS test strip USE TO TEST ONCE DAILY  . ADVAIR HFA 115-21 MCG/ACT inhaler Inhale 2 puffs into the lungs 2 (two) times daily.   Marland Kitchen albuterol (PROAIR HFA) 108 (90 BASE) MCG/ACT inhaler Inhale into  the lungs every 4 (four) hours as needed.   . AMITIZA 24 MCG capsule TAKE 1 CAPSULE BY MOUTH TWICE DAILY  . aspirin 81 MG tablet Take 81 mg by mouth daily.   Marland Kitchen BIOTIN PO Take by mouth daily. When remembers  . Blood Glucose Monitoring Suppl (ACCU-CHEK AVIVA PLUS) w/Device KIT Use to check blood sugar for type 2 diabetes.  . Cholecalciferol 2000 UNITS CAPS Take by mouth daily.   . Cyanocobalamin (VITAMIN B 12 PO) Take by mouth daily. When remembers  . donepezil (ARICEPT) 5 MG tablet TAKE 1 TABLET(5 MG) BY MOUTH AT BEDTIME  . EPINEPHrine 0.3 mg/0.3 mL IJ SOAJ injection   . estradiol (ESTRACE) 0.5 MG tablet TAKE 1 TABLET BY MOUTH EVERY DAY  . furosemide (LASIX) 20 MG tablet TAKE 1 TABLET(20 MG) BY MOUTH DAILY AS NEEDED FOR SWELLING  . gabapentin (NEURONTIN) 600 MG tablet TAKE 1 TABLET BY MOUTH THREE TIMES DAILY  . levocetirizine (XYZAL) 5 MG tablet Take 5 mg by mouth every evening.   . lovastatin (MEVACOR) 40 MG tablet Take 1 tablet by mouth at bedtime  . mometasone (ELOCON) 0.1 % cream APP EXT AA BID  . SitaGLIPtin-MetFORMIN HCl (JANUMET XR) 50-1000 MG TB24 Take 2 tablets by mouth daily.  Marland Kitchen spironolactone (ALDACTONE) 100 MG tablet TAKE 1  TABLET BY MOUTH EVERY DAY FOR BLOOD PRESSURE OR SWELLING  . triamcinolone cream (KENALOG) 0.1 % Apply 1 application topically 2 (two) times daily.  Marland Kitchen HYDROcodone-acetaminophen (NORCO/VICODIN) 5-325 MG tablet Take 1-2 tablets by mouth every 6 (six) hours as needed for moderate pain or severe pain. (Patient not taking: Reported on 12/12/2019)   No facility-administered medications prior to visit.    Review of Systems  Constitutional: Negative for appetite change, chills, fatigue and fever.  Respiratory: Negative for chest tightness and shortness of breath.   Cardiovascular: Negative for chest pain and palpitations.  Gastrointestinal: Negative for abdominal pain, nausea and vomiting.  Neurological: Negative for dizziness and weakness.      Objective    BP  (!) 150/70 (BP Location: Right Arm, Patient Position: Sitting, Cuff Size: Large)   Pulse 72   Temp (!) 97.3 F (36.3 C) (Temporal)   Resp 16   Wt 159 lb (72.1 kg)   SpO2 98% Comment: room air  BMI 31.05 kg/m    Physical Exam   General: Appearance:    Obese female in no acute distress  Eyes:    PERRL, conjunctiva/corneas clear, EOM's intact       Lungs:     Clear to auscultation bilaterally, respirations unlabored  Heart:    Normal heart rate. Normal rhythm.  2/6 systolic murmur  MS:   All extremities are intact.   Neurologic:   Awake, alert, oriented x 3. No apparent focal neurological           defect.       Results for orders placed or performed in visit on 12/12/19  POCT HgB A1C  Result Value Ref Range   Hemoglobin A1C 7.0 (A) 4.0 - 5.6 %   Est. average glucose Bld gHb Est-mCnc 154     Assessment & Plan     1. Type 2 diabetes mellitus with other specified complication, without long-term current use of insulin (HCC) Well controlled.  Unclear if rash below is related to medications, but she does report similar rashes with diabetic medications in the past, will hold Janumet for the next 2 weeks to see if rash improves.   2. Eczema, unspecified type Unclear if this is truly eczema or allergic rash. Minimal improvement with triamcinolone.  3. Essential (primary) hypertension SBP elevated today, but usually better controlled. Continue same medication and home BP monitoring.   4. Hypercholesteremia She is tolerating lovastatin well with no adverse effects.   - Lipid panel  5. Vitamin D deficiency  - VITAMIN D 25 Hydroxy (Vit-D Deficiency, Fractures)  6. Memory difficulties She states she hasn't noticed much difference since starting 36m donepezil last year, try increasing to - donepezil (ARICEPT) 10 MG tablet; Take 1 tablet (10 mg total) by mouth daily.  Dispense: 30 tablet; Refill: 2  7. Rash Suspect drug rash, hold Janumet for 2 weeks as above. Consider putting  donepezil  On hold.    No follow-ups on file.      The entirety of the information documented in the History of Present Illness, Review of Systems and Physical Exam were personally obtained by me. Portions of this information were initially documented by the CMA and reviewed by me for thoroughness and accuracy.      DLelon Huh MD  [phone](phone) [phone](fax)  [place]

## 2019-12-12 ENCOUNTER — Encounter: Payer: Self-pay | Admitting: Family Medicine

## 2019-12-12 ENCOUNTER — Other Ambulatory Visit: Payer: Self-pay

## 2019-12-12 ENCOUNTER — Ambulatory Visit (INDEPENDENT_AMBULATORY_CARE_PROVIDER_SITE_OTHER): Payer: Medicare Other | Admitting: Family Medicine

## 2019-12-12 VITALS — BP 150/70 | HR 72 | Temp 97.3°F | Resp 16 | Wt 159.0 lb

## 2019-12-12 DIAGNOSIS — E7800 Pure hypercholesterolemia, unspecified: Secondary | ICD-10-CM | POA: Diagnosis not present

## 2019-12-12 DIAGNOSIS — I10 Essential (primary) hypertension: Secondary | ICD-10-CM | POA: Diagnosis not present

## 2019-12-12 DIAGNOSIS — R21 Rash and other nonspecific skin eruption: Secondary | ICD-10-CM | POA: Diagnosis not present

## 2019-12-12 DIAGNOSIS — E559 Vitamin D deficiency, unspecified: Secondary | ICD-10-CM

## 2019-12-12 DIAGNOSIS — J301 Allergic rhinitis due to pollen: Secondary | ICD-10-CM | POA: Diagnosis not present

## 2019-12-12 DIAGNOSIS — R413 Other amnesia: Secondary | ICD-10-CM

## 2019-12-12 DIAGNOSIS — E1169 Type 2 diabetes mellitus with other specified complication: Secondary | ICD-10-CM

## 2019-12-12 DIAGNOSIS — J3089 Other allergic rhinitis: Secondary | ICD-10-CM | POA: Diagnosis not present

## 2019-12-12 DIAGNOSIS — I770 Arteriovenous fistula, acquired: Secondary | ICD-10-CM | POA: Insufficient documentation

## 2019-12-12 DIAGNOSIS — L309 Dermatitis, unspecified: Secondary | ICD-10-CM | POA: Diagnosis not present

## 2019-12-12 LAB — POCT GLYCOSYLATED HEMOGLOBIN (HGB A1C)
Est. average glucose Bld gHb Est-mCnc: 154
Hemoglobin A1C: 7 % — AB (ref 4.0–5.6)

## 2019-12-12 MED ORDER — DONEPEZIL HCL 10 MG PO TABS: 10.0000 mg | ORAL_TABLET | Freq: Every day | ORAL | 2 refills | Status: DC

## 2019-12-12 NOTE — Patient Instructions (Signed)
.   Please review the attached list of medications and notify my office if there are any errors.   . Stop Janumet for the next two weeks to see if the rash gets better off of that medication.

## 2019-12-13 LAB — LIPID PANEL
Chol/HDL Ratio: 4.6 ratio — ABNORMAL HIGH (ref 0.0–4.4)
Cholesterol, Total: 164 mg/dL (ref 100–199)
HDL: 36 mg/dL — ABNORMAL LOW (ref >39)
LDL Chol Calc (NIH): 84 mg/dL (ref 0–99)
Triglycerides: 264 mg/dL — ABNORMAL HIGH (ref 0–149)
VLDL Cholesterol Cal: 44 mg/dL — ABNORMAL HIGH (ref 5–40)

## 2019-12-13 LAB — VITAMIN D 25 HYDROXY (VIT D DEFICIENCY, FRACTURES): Vit D, 25-Hydroxy: 59.8 ng/mL (ref 30.0–100.0)

## 2019-12-18 ENCOUNTER — Other Ambulatory Visit: Payer: Self-pay

## 2019-12-18 ENCOUNTER — Ambulatory Visit: Payer: Medicare Other | Admitting: Pharmacist

## 2019-12-18 DIAGNOSIS — I10 Essential (primary) hypertension: Secondary | ICD-10-CM

## 2019-12-18 DIAGNOSIS — E1169 Type 2 diabetes mellitus with other specified complication: Secondary | ICD-10-CM

## 2019-12-18 NOTE — Patient Instructions (Addendum)
Visit Information  Goals Addressed            This Visit's Progress   . Chronic Care Management       CARE PLAN ENTRY  Current Barriers:  . Chronic Disease Management support, education, and care coordination needs related to Hypertension, Hyperlipidemia, Diabetes, Asthma, and Post herpetic neuroalgia   Hypertension . Pharmacist Clinical Goal(s): o Over the next 90 days, patient will work with PharmD and providers to maintain BP goal <130/80 . Current regimen:  o Furosemide and spironolactone . Interventions: o Office readings are high, but normal at home o Patient to take BP at home . Patient self care activities - Over the next 90 days, patient will: o Check BP daily, document, and provide at future appointments o Ensure daily salt intake < 2300 mg/day  Diabetes . Pharmacist Clinical Goal(s): o Over the next 90 days, patient will work with PharmD and providers to achieve A1c goal <7% . Current regimen:  o None o Patient has had severe adverse effects or an allergic reaction to all major diabetes medications . Interventions: o Improve diet and increase exercise . Patient self care activities - Over the next 90 days, patient will: o Check blood sugar once daily, document, and provide at future appointments o Report results of stopping Janumet, if not responsible for rash, coordinate restart with provider o Contact provider with any episodes of hypoglycemia  Asthma . Pharmacist Clinical Goal(s) o Over the next 90 days, patient will work with PharmD and providers to reduce rescue inhaler use . Current regimen:  o Advair sporadically, ProAir as needed (every few days) . Interventions: o Counseled that Advair is a maintenance inhaler which should be used daily . Patient self care activities - Over the next 90 days, patient will: o Take Advair as directed  Medication management . Pharmacist Clinical Goal(s): o Over the next 90 days, patient will work with PharmD and  providers to achieve optimal medication adherence . Current pharmacy: [facility] . Interventions o Comprehensive medication review performed. o Continue current medication management strategy . Patient self care activities - Over the next 90 days, patient will: o Focus on medication adherence by using Advair daily o Take medications as prescribed o Report any questions or concerns to PharmD and/or provider(s)  Initial goal documentation        Sharon Nelson was given information about Chronic Care Management services today including:  1. CCM service includes personalized support from designated clinical staff supervised by her physician, including individualized plan of care and coordination with other care providers 2. 24/7 contact phone numbers for assistance for urgent and routine care needs. 3. Standard insurance, coinsurance, copays and deductibles apply for chronic care management only during months in which we provide at least 20 minutes of these services. Most insurances cover these services at 100%, however patients may be responsible for any copay, coinsurance and/or deductible if applicable. This service may help you avoid the need for more expensive face-to-face services. 4. Only one practitioner may furnish and bill the service in a calendar month. 5. The patient may stop CCM services at any time (effective at the end of the month) by phone call to the office staff.  Patient agreed to services and verbal consent obtained.   Print copy of patient instructions provided.  Telephone follow up appointment with pharmacy team member scheduled for: 3 months  Milus Height, PharmD, [place], [place] [phone]   Menopause and Hormone Replacement Therapy  Menopause is a normal time of life when menstrual periods stop completely and the ovaries stop producing the female hormones estrogen and progesterone. This lack of hormones can affect your health  and cause undesirable symptoms. Hormone replacement therapy (HRT) can relieve some of those symptoms. What is hormone replacement therapy? HRT is the use of artificial (synthetic) hormones to replace hormones that your body has stopped producing because you have reached menopause. What are my options for HRT?  HRT may consist of the synthetic hormones estrogen and progestin, or it may consist of only estrogen (estrogen-only therapy). You and your health care provider will decide which form of HRT is best for you. If you choose to be on HRT and you have a uterus, estrogen and progestin are usually prescribed. Estrogen-only therapy is used for women who do not have a uterus. Possible options for taking HRT include:  Pills.  Patches.  Gels.  Sprays.  Vaginal cream.  Vaginal rings.  Vaginal inserts. The amount of hormone(s) that you take and how long you take the hormone(s) varies according to your health. It is important to:  Begin HRT with the lowest possible dosage.  Stop HRT as soon as your health care provider tells you to stop.  Work with your health care provider so that you feel informed and comfortable with your decisions. What are the benefits of HRT? HRT can reduce the frequency and severity of menopausal symptoms. Benefits of HRT vary according to the kind of symptoms that you have, how severe they are, and your overall health. HRT may help to improve the following symptoms of menopause:  Hot flashes and night sweats. These are sudden feelings of heat that spread over the face and body. The skin may turn red, like a blush. Night sweats are hot flashes that happen while you are sleeping or trying to sleep.  Bone loss (osteoporosis). The body loses calcium more quickly after menopause, causing the bones to become weaker. This can increase the risk for bone breaks (fractures).  Vaginal dryness. The lining of the vagina can become thin and dry, which can cause pain during sex  or cause infection, burning, or itching.  Urinary tract infections.  Urinary incontinence. This is the inability to control when you pass urine.  Irritability.  Short-term memory problems. What are the risks of HRT? Risks of HRT vary depending on your individual health and medical history. Risks of HRT also depend on whether you receive both estrogen and progestin or you receive estrogen only. HRT may increase the risk of:  Spotting. This is when a small amount of blood leaks from the vagina unexpectedly.  Endometrial cancer. This cancer is in the lining of the uterus (endometrium).  Breast cancer.  Increased density of breast tissue. This can make it harder to find breast cancer on a breast X-ray (mammogram).  Stroke.  Heart disease.  Blood clots.  Gallbladder disease.  Liver disease. Risks of HRT can increase if you have any of the following conditions:  Endometrial cancer.  Liver disease.  Heart disease.  Breast cancer.  History of blood clots.  History of stroke. Follow these instructions at home:  Take over-the-counter and prescription medicines only as told by your health care provider.  Get mammograms, pelvic exams, and medical checkups as often as told by your health care provider.  Have Pap tests done as often as told by your health care provider. A Pap test is sometimes called a Pap smear. It is a screening test  that is used to check for signs of cancer of the cervix and vagina. A Pap test can also identify the presence of infection or precancerous changes. Pap tests may be done: ? Every 3 years, starting at age 18. ? Every 5 years, starting after age 51, in combination with testing for human papillomavirus (HPV). ? More often or less often depending on other medical conditions you have, your age, and other risk factors.  It is up to you to get the results of your Pap test. Ask your health care provider, or the department that is doing the test, when  your results will be ready.  Keep all follow-up visits as told by your health care provider. This is important. Contact a health care provider if you have:  Pain or swelling in your legs.  Shortness of breath.  Chest pain.  Lumps or changes in your breasts or armpits.  Slurred speech.  Pain, burning, or bleeding when you urinate.  Unusual vaginal bleeding.  Dizziness or headaches.  Weakness or numbness in any part of your arms or legs.  Pain in your abdomen. Summary  Menopause is a normal time of life when menstrual periods stop completely and the ovaries stop producing the female hormones estrogen and progesterone.  Hormone replacement therapy (HRT) can relieve some of the symptoms of menopause.  HRT can reduce the frequency and severity of menopausal symptoms.  Risks of HRT vary depending on your individual health and medical history. This information is not intended to replace advice given to you by your health care provider. Make sure you discuss any questions you have with your health care provider. Document Revised: 03/15/2018 Document Reviewed: 03/15/2018 Elsevier Patient Education  2020 [employer].
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
## 2019-12-19 DIAGNOSIS — J301 Allergic rhinitis due to pollen: Secondary | ICD-10-CM | POA: Diagnosis not present

## 2019-12-19 DIAGNOSIS — J3089 Other allergic rhinitis: Secondary | ICD-10-CM | POA: Diagnosis not present

## 2019-12-25 ENCOUNTER — Other Ambulatory Visit: Payer: Self-pay | Admitting: Family Medicine

## 2019-12-26 DIAGNOSIS — J3089 Other allergic rhinitis: Secondary | ICD-10-CM | POA: Diagnosis not present

## 2019-12-26 DIAGNOSIS — J301 Allergic rhinitis due to pollen: Secondary | ICD-10-CM | POA: Diagnosis not present

## 2019-12-27 ENCOUNTER — Telehealth: Payer: Self-pay | Admitting: Family Medicine

## 2019-12-27 DIAGNOSIS — E1169 Type 2 diabetes mellitus with other specified complication: Secondary | ICD-10-CM

## 2019-12-27 NOTE — Telephone Encounter (Signed)
Please check with patient to see if rash has improved since stopping Janumet. If so then we can changed to something different, if not then I would recommend stopping donepezil to see if that helps.

## 2019-12-29 NOTE — Telephone Encounter (Signed)
Patient states that the rash has resolved since stopping the Janumet. Patient is in agreement with changing Janumet to something different.   Pharmacy: [facility]. [address]

## 2020-01-02 DIAGNOSIS — J3089 Other allergic rhinitis: Secondary | ICD-10-CM | POA: Diagnosis not present

## 2020-01-02 DIAGNOSIS — J301 Allergic rhinitis due to pollen: Secondary | ICD-10-CM | POA: Diagnosis not present

## 2020-01-02 MED ORDER — GLIPIZIDE 5 MG PO TABS: 5.0000 mg | ORAL_TABLET | Freq: Every day | ORAL | 2 refills | Status: DC

## 2020-01-02 NOTE — Telephone Encounter (Signed)
Have sent prescription for glipizide. Schedule follow up in about 6 weeks.

## 2020-01-03 NOTE — Telephone Encounter (Signed)
Pt states that she spoke to the nurse of her allergist but not her doctor, they say they cannot suggest a medication however they can only treat her if she breaks out.  Pt believes she needs to speak to a diabetic specialist. Seeking referral

## 2020-01-03 NOTE — Telephone Encounter (Signed)
Patient advised as below. She says she has looked up the side effects of Glipizide and one of the side effects is hives or rash. Patient wants to know if Dr. Caryn Section thinks that Glipizide is the best choice of medication to try since she has had problems with other medications causing rash? Patient says she plans on speaking to her allergist today to find out what they recommend that she may be able to tolerate without breaking out in hives/ rash. Patient is going to call our office back to let us know what her allergist recommendations are.

## 2020-01-04 ENCOUNTER — Telehealth: Payer: Self-pay | Admitting: Family Medicine

## 2020-01-04 NOTE — Telephone Encounter (Signed)
-----   Message from Verdia Kuba, [facility] sent at 12/18/2019  8:40 PM EDT ----- Regarding: Clinical Pharmacy Recommendations Hello Dr. Caryn Section,  As you are aware, Ms Kronberg has many allergies, adverse reactions, and sensitivities which are making her pharmacological care very challenging. Below are a few items Ms Bernardy and I discussed:  1. She has not yet stopped Janumet because it was still present in her pill organizer. I am unable to evaluate, at this time, if she was having an allergic reaction to the Janumet. Per patient, she has failed SGLT-2 and GLP-1 medications as well as Januvia and metformin separately as monotherapy. I was not able to verify some of these medication from her history. She is not at goal, and not currently treated. Recommendation: Start glipizide 5mg  1 tab by mouth daily with breakfast  2. She has made several dietary changes recently including adding new spices and vegetables to her diet. This is making it very difficult to assess her allergies. Recommendation: Referral to an allergist for full evaluation.  3. She was told, incorrectly, at the ED that due to her Shingles flare, she is not eligible for a Shingrix series. Despite neuropathic pain, she is not taking a B complex to rule out deficiences. Recommendation: Start Shingrix series Start vitamin B complex daily  4. She uses Advair sporadically and is, therefore, using her rescue inhaler about once weekly. Recommendation: Patient to use Advair daily May need new Rx. I think her inhalers are old.  5.  She is interested in tapering off estradiol after considering the risks vs the benefits. Recommendation: D/c estradiol 0.5mg  daily  Start estradiol 0.25mg  1 tab by mouth daily  Thanks,  Sheppard Plumber, PharmD, [place], [place] [phone]

## 2020-01-04 NOTE — Telephone Encounter (Signed)
Will refill advair and reduced estradiole as discussed below

## 2020-01-04 NOTE — Addendum Note (Signed)
Addended by: Birdie Sons on: 01/04/2020 08:07 AM   Modules accepted: Orders

## 2020-01-05 ENCOUNTER — Telehealth: Payer: Self-pay | Admitting: Family Medicine

## 2020-01-05 NOTE — Telephone Encounter (Signed)
Copied from [place] [phone]. Topic: General - Call Back - No Documentation >> Jan 05, 2020  3:35 PM Erick Blinks wrote: Reason for CRM: Pt called back tor report that she has information from her allergist  Best contact: [phone]  [phone] (cell)

## 2020-01-08 DIAGNOSIS — J3089 Other allergic rhinitis: Secondary | ICD-10-CM | POA: Diagnosis not present

## 2020-01-08 DIAGNOSIS — J301 Allergic rhinitis due to pollen: Secondary | ICD-10-CM | POA: Diagnosis not present

## 2020-01-08 NOTE — Telephone Encounter (Signed)
Attempted to contact patient, no answer, phone connection was interrupted  Okay for [facility] triage to get more information from patient.

## 2020-01-08 NOTE — Telephone Encounter (Signed)
Patient returned call about the infromation from the allergist. Patient says there are no new breakouts after being off my medicine. She says she spoke to nurse Mickel Baas of the allergist Dr. Donneta Romberg, phone number [phone] [facility] [place] office); [phone] [facility] office). The nurse said Dr. Donneta Romberg doesn't feel like there is anything that they could identify from my medications. She strongly suggest me to be referred to [facility] to do testing on medication reactions.

## 2020-01-08 NOTE — Telephone Encounter (Signed)
Patient called on home number, left VM to return the call to the office.

## 2020-01-09 DIAGNOSIS — J3089 Other allergic rhinitis: Secondary | ICD-10-CM | POA: Diagnosis not present

## 2020-01-09 DIAGNOSIS — J301 Allergic rhinitis due to pollen: Secondary | ICD-10-CM | POA: Diagnosis not present

## 2020-01-12 ENCOUNTER — Telehealth: Payer: Self-pay | Admitting: *Deleted

## 2020-01-12 ENCOUNTER — Other Ambulatory Visit: Payer: Self-pay | Admitting: Family Medicine

## 2020-01-12 NOTE — Telephone Encounter (Signed)
Tried calling patient. Left message to call back. Need more information. OK for [facility] triage to speak with patient to get more information and detail. Which medications does she have a question about? What is her question?

## 2020-01-12 NOTE — Telephone Encounter (Signed)
Copied from [place] [phone]. Topic: General - Call Back - No Documentation >> Jan 12, 2020  4:25 PM Erick Blinks wrote: [phone] Pt has questions regarding her medication for clinic, please advise. She will be seeing a diabetic specialist soon and is not sure whether she should spend her money on what has been prescribed today for her

## 2020-01-15 NOTE — Telephone Encounter (Signed)
Attempted to reach pt. Message left to call back. Need more information on which medication she is concerned with.

## 2020-01-16 DIAGNOSIS — E1169 Type 2 diabetes mellitus with other specified complication: Secondary | ICD-10-CM | POA: Diagnosis not present

## 2020-01-16 DIAGNOSIS — E1159 Type 2 diabetes mellitus with other circulatory complications: Secondary | ICD-10-CM | POA: Diagnosis not present

## 2020-01-16 DIAGNOSIS — J301 Allergic rhinitis due to pollen: Secondary | ICD-10-CM | POA: Diagnosis not present

## 2020-01-16 DIAGNOSIS — J3089 Other allergic rhinitis: Secondary | ICD-10-CM | POA: Diagnosis not present

## 2020-01-16 DIAGNOSIS — E1165 Type 2 diabetes mellitus with hyperglycemia: Secondary | ICD-10-CM | POA: Diagnosis not present

## 2020-01-16 DIAGNOSIS — E785 Hyperlipidemia, unspecified: Secondary | ICD-10-CM | POA: Diagnosis not present

## 2020-01-16 DIAGNOSIS — I152 Hypertension secondary to endocrine disorders: Secondary | ICD-10-CM | POA: Diagnosis not present

## 2020-01-19 NOTE — Telephone Encounter (Signed)
Attempted to contact patient, no answer or voicemail. Okay for [facility] to advise patient.  

## 2020-01-30 DIAGNOSIS — J301 Allergic rhinitis due to pollen: Secondary | ICD-10-CM | POA: Diagnosis not present

## 2020-01-30 DIAGNOSIS — J3089 Other allergic rhinitis: Secondary | ICD-10-CM | POA: Diagnosis not present

## 2020-02-06 DIAGNOSIS — J3089 Other allergic rhinitis: Secondary | ICD-10-CM | POA: Diagnosis not present

## 2020-02-06 DIAGNOSIS — J301 Allergic rhinitis due to pollen: Secondary | ICD-10-CM | POA: Diagnosis not present

## 2020-02-15 DIAGNOSIS — J3089 Other allergic rhinitis: Secondary | ICD-10-CM | POA: Diagnosis not present

## 2020-02-15 DIAGNOSIS — J301 Allergic rhinitis due to pollen: Secondary | ICD-10-CM | POA: Diagnosis not present

## 2020-02-15 DIAGNOSIS — J3081 Allergic rhinitis due to animal (cat) (dog) hair and dander: Secondary | ICD-10-CM | POA: Diagnosis not present

## 2020-02-20 DIAGNOSIS — J3089 Other allergic rhinitis: Secondary | ICD-10-CM | POA: Diagnosis not present

## 2020-02-20 DIAGNOSIS — J301 Allergic rhinitis due to pollen: Secondary | ICD-10-CM | POA: Diagnosis not present

## 2020-02-27 DIAGNOSIS — J3089 Other allergic rhinitis: Secondary | ICD-10-CM | POA: Diagnosis not present

## 2020-02-27 DIAGNOSIS — J301 Allergic rhinitis due to pollen: Secondary | ICD-10-CM | POA: Diagnosis not present

## 2020-03-05 DIAGNOSIS — J301 Allergic rhinitis due to pollen: Secondary | ICD-10-CM | POA: Diagnosis not present

## 2020-03-13 ENCOUNTER — Other Ambulatory Visit: Payer: Self-pay | Admitting: Family Medicine

## 2020-03-13 DIAGNOSIS — G43019 Migraine without aura, intractable, without status migrainosus: Secondary | ICD-10-CM

## 2020-03-13 DIAGNOSIS — G500 Trigeminal neuralgia: Secondary | ICD-10-CM

## 2020-03-13 DIAGNOSIS — M542 Cervicalgia: Secondary | ICD-10-CM

## 2020-03-13 NOTE — Telephone Encounter (Signed)
Requested Prescriptions  Pending Prescriptions Disp Refills  . gabapentin (NEURONTIN) 600 MG tablet [Pharmacy Med Name: GABAPENTIN 600MG  TABLETS] 270 tablet 2    Sig: TAKE 1 TABLET BY MOUTH THREE TIMES DAILY     Neurology: Anticonvulsants - gabapentin Passed - 03/13/2020  1:29 PM      Passed - Valid encounter within last 12 months    Recent Outpatient Visits          3 months ago Type 2 diabetes mellitus with other specified complication, without long-term current use of insulin [facility])   [facility] Birdie Sons, MD   3 months ago Eczema, unspecified type   [place], [place]le[place], PA-C   7 months ago Shortness of breath   [facility], Kelby Aline, FNP   7 months ago Other postherpetic nervous system involvement   [facility] Birdie Sons, MD   10 months ago Type 2 diabetes mellitus with other specified complication, without long-term current use of insulin [facility])   [facility] [place] Caryn Section, Kirstie Peri, MD

## 2020-03-14 ENCOUNTER — Other Ambulatory Visit: Payer: Self-pay | Admitting: Family Medicine

## 2020-03-14 DIAGNOSIS — J301 Allergic rhinitis due to pollen: Secondary | ICD-10-CM | POA: Diagnosis not present

## 2020-03-14 DIAGNOSIS — J3089 Other allergic rhinitis: Secondary | ICD-10-CM | POA: Diagnosis not present

## 2020-03-14 NOTE — Telephone Encounter (Signed)
Requested medication (s) are due for refill today: no  Requested medication (s) are on the active medication list: no  Last refill:  Discontinued on 11/22/19  Future visit scheduled: no  Notes to clinic:  Please review for refill. Medication not on current med list.    Requested Prescriptions  Pending Prescriptions Disp Refills   lidocaine (LIDODERM) 5 % [Pharmacy Med Name: LIDOCAINE 5% PATCH] 30 patch 1    Sig: PLACE 1 PATCH ONTO SKIN EVERY DAY FOR POST HERPETIC NEURALGIA. REMOVE AND DISCARD AFTER 12 HOURS OR AS DIRECTED BY DOCTOR      Analgesics:  Topicals Passed - 03/14/2020  4:32 PM      Passed - Valid encounter within last 12 months    Recent Outpatient Visits           3 months ago Type 2 diabetes mellitus with other specified complication, without long-term current use of insulin [facility])   [facility] Birdie Sons, MD   3 months ago Eczema, unspecified type   [place], Stuart, PA-C   7 months ago Shortness of breath   [facility], Kelby Aline, FNP   7 months ago Other postherpetic nervous system involvement   [facility] Birdie Sons, MD   10 months ago Type 2 diabetes mellitus with other specified complication, without long-term current use of insulin [facility])   [facility] Birdie Sons, MD

## 2020-03-19 DIAGNOSIS — H02834 Dermatochalasis of left upper eyelid: Secondary | ICD-10-CM | POA: Diagnosis not present

## 2020-03-19 DIAGNOSIS — H02831 Dermatochalasis of right upper eyelid: Secondary | ICD-10-CM | POA: Diagnosis not present

## 2020-03-21 DIAGNOSIS — J3089 Other allergic rhinitis: Secondary | ICD-10-CM | POA: Diagnosis not present

## 2020-03-21 DIAGNOSIS — J301 Allergic rhinitis due to pollen: Secondary | ICD-10-CM | POA: Diagnosis not present

## 2020-03-22 ENCOUNTER — Ambulatory Visit (INDEPENDENT_AMBULATORY_CARE_PROVIDER_SITE_OTHER): Payer: Medicare Other | Admitting: Pharmacist

## 2020-03-22 ENCOUNTER — Other Ambulatory Visit: Payer: Self-pay

## 2020-03-22 DIAGNOSIS — E1169 Type 2 diabetes mellitus with other specified complication: Secondary | ICD-10-CM

## 2020-03-22 DIAGNOSIS — J309 Allergic rhinitis, unspecified: Secondary | ICD-10-CM

## 2020-03-23 NOTE — Chronic Care Management (AMB) (Signed)
Chronic Care Management Pharmacy  Name: Sharon Nelson  MRN: [id number] DOB: 08-10-1950  Chief Complaint/ HPI  Sharon Nelson,  69 y.o. , female presents for their Follow-Up CCM visit with the clinical pharmacist via telephone due to COVID-19 Pandemic.  PCP : Birdie Sons, MD  Their chronic conditions include: DM, HLD, asthma  Office Visits:NA  Consult Visit:NA  Medications: Outpatient Encounter Medications as of 03/22/2020  Medication Sig Note  . ACCU-CHEK AVIVA PLUS test strip USE TO TEST ONCE DAILY AS DIRECTED   . ADVAIR HFA 115-21 MCG/ACT inhaler Inhale 2 puffs into the lungs 2 (two) times daily.    Marland Kitchen albuterol (PROAIR HFA) 108 (90 BASE) MCG/ACT inhaler Inhale into the lungs every 4 (four) hours as needed.  01/25/2015: Received from: [place]  . AMITIZA 24 MCG capsule TAKE 1 CAPSULE BY MOUTH TWICE DAILY   . aspirin 81 MG tablet Take 81 mg by mouth daily.  01/25/2015: Received from: [place]  . BIOTIN PO Take by mouth daily. When remembers   . Blood Glucose Monitoring Suppl (ACCU-CHEK AVIVA PLUS) w/Device KIT Use to check blood sugar for type 2 diabetes.   . Cholecalciferol 2000 UNITS CAPS Take by mouth daily.  01/25/2015: Received from: [place]  . Cyanocobalamin (VITAMIN B 12 PO) Take by mouth daily. When remembers   . donepezil (ARICEPT) 10 MG tablet Take 1 tablet (10 mg total) by mouth daily.   Marland Kitchen EPINEPHrine 0.3 mg/0.3 mL IJ SOAJ injection  02/05/2016: PRN   . estradiol (ESTRACE) 0.5 MG tablet TAKE 1 TABLET BY MOUTH EVERY DAY   . furosemide (LASIX) 20 MG tablet TAKE 1 TABLET(20 MG) BY MOUTH DAILY AS NEEDED FOR SWELLING   . gabapentin (NEURONTIN) 600 MG tablet TAKE 1 TABLET BY MOUTH THREE TIMES DAILY   . glipiZIDE (GLUCOTROL) 5 MG tablet Take 1 tablet (5 mg total) by mouth daily before breakfast.   . HYDROcodone-acetaminophen (NORCO/VICODIN) 5-325 MG tablet Take 1-2 tablets by mouth every 6 (six) hours as needed for  moderate pain or severe pain. (Patient not taking: Reported on 12/12/2019)   . levocetirizine (XYZAL) 5 MG tablet Take 5 mg by mouth every evening.  01/25/2015: Received from: [place]  . 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 REMOVE AND DISCARD AFTER 12 HOURS OR AS DIRECTED BY DOCTOR   . lovastatin (MEVACOR) 40 MG tablet Take 1 tablet by mouth at bedtime 01/25/2015: Received from: [facility]  . mometasone (ELOCON) 0.1 % cream APP EXT AA BID   . spironolactone (ALDACTONE) 100 MG tablet TAKE 1 TABLET BY MOUTH EVERY DAY FOR BLOOD PRESSURE OR SWELLING   . triamcinolone cream (KENALOG) 0.1 % Apply 1 application topically 2 (two) times daily. (Patient not taking: Reported on 12/18/2019)    No facility-administered encounter medications on file as of 03/22/2020.      Financial Resource Strain: Low Risk   . Difficulty of Paying Living Expenses: Not hard at all   Current Diagnosis/Assessment:  Goals Addressed            This Visit's Progress   . Chronic Care Management       CARE PLAN ENTRY  Current Barriers:  . Chronic Disease Management support, education, and care coordination needs related to Hypertension, Hyperlipidemia, Diabetes, Asthma, and Post herpetic neuroalgia   Hypertension . Pharmacist Clinical Goal(s): o Over the next 90 days,  patient will work with PharmD and providers to maintain BP goal <130/80 . Current regimen:  o Furosemide and spironolactone . Interventions: o Office readings are high, but normal at home o Patient to take BP at home . Patient self care activities - Over the next 90 days, patient will: o Check BP daily, document, and provide at future appointments o Ensure daily salt intake < 2300 mg/day  Diabetes . Pharmacist Clinical Goal(s): o Over the next 90 days, patient will work with PharmD and providers to achieve A1c goal <7% . Current regimen:  o None o Patient has  had severe adverse effects or an allergic reaction to all major diabetes medications . Interventions: o Rechallenge with Ozempic if provider agrees . Patient self care activities - Over the next 90 days, patient will: o Check blood sugar once daily, document, and provide at future appointments o Contact provider with any episodes of hypoglycemia  Asthma . Pharmacist Clinical Goal(s) o Over the next 90 days, patient will work with PharmD and providers to reduce rescue inhaler use . Current regimen:  o Advair twice daily, ProAir as needed (about once monthly) . Interventions: o Counseled that Advair is a maintenance inhaler which should be used daily . Patient self care activities - Over the next 90 days, patient will: o Now taking Advair as directed  Medication management . Pharmacist Clinical Goal(s): o Over the next 90 days, patient will work with PharmD and providers to achieve optimal medication adherence . Current pharmacy: [facility] . Interventions o Comprehensive medication review performed. o Continue current medication management strategy . Patient self care activities - Over the next 90 days, patient will: o Take medications as prescribed o Report any questions or concerns to PharmD and/or provider(s)  Initial goal documentation        Diabetes   Recent Relevant Labs: Lab Results  Component Value Date/Time   HGBA1C 7.0 (A) 12/12/2019 10:09 AM   HGBA1C 7.7 (A) 08/11/2019 02:43 PM   HGBA1C 8.7 (H) 09/08/2018 10:12 AM   HGBA1C 8.4 (H) 07/30/2017 11:25 AM   HGBA1C 8.0 09/25/2016 12:00 AM   HGBA1C 7.8 (H) 02/10/2014 06:33 AM   MICROALBUR negative 05/15/2019 09:14 AM   MICROALBUR 20 02/23/2018 10:00 AM     Checking BG: 2x per Day  Recent pre-meal BG readings: 90 - 200+ with little food Patient has failed these meds in past: Ozempic, Trulicity, Janumet Patient is currently controlled on the following medications: glipizide  Last diabetic Foot exam:  Lab  Results  Component Value Date/Time   HMDIABEYEEXA No Retinopathy 12/07/2019 12:00 AM    Last diabetic Eye exam: No results found for: HMDIABFOOTEX   We discussed:  Stopped Janumet? yes Shingrix? No Vitamin B? No Doesn't break out with glipizide Maybe increase glipizide? Allergic to Ozempic, Trulicity Maybe Actos Ozempic rechallenge What cream was it that he prescribed, willing to do Shingrix  Plan  Recommend Ozempic rechallenge  Asthma   Eosinophil count:   Lab Results  Component Value Date/Time   EOSPCT 0.1 02/11/2014 04:21 AM  %                               Eos (Absolute):  Lab Results  Component Value Date/Time   EOSABS 0.2 02/23/2018 10:06 AM   EOSABS 0.0 02/11/2014 04:21 AM    Tobacco Status:  Social History   Tobacco Use  Smoking Status Former Smoker  . Packs/day: 0.75  .  Years: 22.00  . Pack years: 16.50  . Types: Cigarettes  . Quit date: 07/27/1985  . Years since quitting: 34.6  Smokeless Tobacco Never Used  Tobacco Comment   smoked as teenager    Patient has failed these meds in past: NA Patient is currently controlled on the following medications: Advair HFA 115/21, ProAir HFA, Xyzal 17m qhs Using maintenance inhaler regularly? Yes Frequency of rescue inhaler use:  infrequently  We discussed:   Using Advair daily? Yes Rescue inhaler once montly   Plan  Continue current medications  Medication Management   We discussed:  Increased Aricept? Yes, effective Wonders if it's just bad memory  Plan  Continue current medication management strategy  Follow up: 3 month phone visit  TMilus Height PharmD, [phone]

## 2020-03-23 NOTE — Chronic Care Management (AMB) (Deleted)
Chronic Care Management Pharmacy  Name: LANIYA FRIEDL  MRN: [id number] DOB: 1950-09-03  Chief Complaint/ HPI  Sharon Nelson,  69 y.o. , female presents for their Follow-Up CCM visit with the clinical pharmacist via telephone due to COVID-19 Pandemic.  PCP : Birdie Sons, MD  Their chronic conditions include: DM, HLD, asthma  Office Visits:NA  Consult Visit:NA  Medications: Outpatient Encounter Medications as of 03/22/2020  Medication Sig Note  . ACCU-CHEK AVIVA PLUS test strip USE TO TEST ONCE DAILY AS DIRECTED   . ADVAIR HFA 115-21 MCG/ACT inhaler Inhale 2 puffs into the lungs 2 (two) times daily.    Marland Kitchen albuterol (PROAIR HFA) 108 (90 BASE) MCG/ACT inhaler Inhale into the lungs every 4 (four) hours as needed.  01/25/2015: Received from: [place]  . AMITIZA 24 MCG capsule TAKE 1 CAPSULE BY MOUTH TWICE DAILY   . aspirin 81 MG tablet Take 81 mg by mouth daily.  01/25/2015: Received from: [place]  . BIOTIN PO Take by mouth daily. When remembers   . Blood Glucose Monitoring Suppl (ACCU-CHEK AVIVA PLUS) w/Device KIT Use to check blood sugar for type 2 diabetes.   . Cholecalciferol 2000 UNITS CAPS Take by mouth daily.  01/25/2015: Received from: [place]  . Cyanocobalamin (VITAMIN B 12 PO) Take by mouth daily. When remembers   . donepezil (ARICEPT) 10 MG tablet Take 1 tablet (10 mg total) by mouth daily.   Marland Kitchen EPINEPHrine 0.3 mg/0.3 mL IJ SOAJ injection  02/05/2016: PRN   . estradiol (ESTRACE) 0.5 MG tablet TAKE 1 TABLET BY MOUTH EVERY DAY   . furosemide (LASIX) 20 MG tablet TAKE 1 TABLET(20 MG) BY MOUTH DAILY AS NEEDED FOR SWELLING   . gabapentin (NEURONTIN) 600 MG tablet TAKE 1 TABLET BY MOUTH THREE TIMES DAILY   . glipiZIDE (GLUCOTROL) 5 MG tablet Take 1 tablet (5 mg total) by mouth daily before breakfast.   . HYDROcodone-acetaminophen (NORCO/VICODIN) 5-325 MG tablet Take 1-2 tablets by mouth every 6 (six) hours as needed for  moderate pain or severe pain. (Patient not taking: Reported on 12/12/2019)   . levocetirizine (XYZAL) 5 MG tablet Take 5 mg by mouth every evening.  01/25/2015: Received from: [place]  . 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 REMOVE AND DISCARD AFTER 12 HOURS OR AS DIRECTED BY DOCTOR   . lovastatin (MEVACOR) 40 MG tablet Take 1 tablet by mouth at bedtime 01/25/2015: Received from: [facility]  . mometasone (ELOCON) 0.1 % cream APP EXT AA BID   . spironolactone (ALDACTONE) 100 MG tablet TAKE 1 TABLET BY MOUTH EVERY DAY FOR BLOOD PRESSURE OR SWELLING   . triamcinolone cream (KENALOG) 0.1 % Apply 1 application topically 2 (two) times daily. (Patient not taking: Reported on 12/18/2019)    No facility-administered encounter medications on file as of 03/22/2020.      Financial Resource Strain: Low Risk   . Difficulty of Paying Living Expenses: Not hard at all   Current Diagnosis/Assessment:  Goals Addressed   None     Diabetes   Recent Relevant Labs: Lab Results  Component Value Date/Time   HGBA1C 7.0 (A) 12/12/2019 10:09 AM   HGBA1C 7.7 (A) 08/11/2019 02:43 PM   HGBA1C 8.7 (H) 09/08/2018 10:12 AM   HGBA1C 8.4 (H) 07/30/2017 11:25 AM   HGBA1C 8.0 09/25/2016 12:00 AM   HGBA1C 7.8 (H) 02/10/2014 06:33 AM  MICROALBUR negative 05/15/2019 09:14 AM   MICROALBUR 20 02/23/2018 10:00 AM     Checking BG: 2x per Day  Recent pre-meal BG readings: 90 - 200+ with little food Patient has failed these meds in past: Ozempic, Trulicity, [place] Patient is currently controlled on the following medications: glipizide  Last diabetic Foot exam:  Lab Results  Component Value Date/Time   HMDIABEYEEXA No Retinopathy 12/07/2019 12:00 AM    Last diabetic Eye exam: No results found for: HMDIABFOOTEX   We discussed:  Stopped Janumet? yes Shingrix? No Vitamin B? No Doesn't break out with glipizide Maybe increase  glipizide? Allergic to Ozempic, Trulicity Maybe Actos Ozempic rechallenge What cream was it that he prescribed, willing to do Shingrix  Plan  Recommend Ozempic rechallenge  Asthma   Eosinophil count:   Lab Results  Component Value Date/Time   EOSPCT 0.1 02/11/2014 04:21 AM  %                               Eos (Absolute):  Lab Results  Component Value Date/Time   EOSABS 0.2 02/23/2018 10:06 AM   EOSABS 0.0 02/11/2014 04:21 AM    Tobacco Status:  Social History   Tobacco Use  Smoking Status Former Smoker  . Packs/day: 0.75  . Years: 22.00  . Pack years: 16.50  . Types: Cigarettes  . Quit date: 07/27/1985  . Years since quitting: 34.6  Smokeless Tobacco Never Used  Tobacco Comment   smoked as teenager    Patient has failed these meds in past: NA Patient is currently controlled on the following medications: Advair HFA 115/21, ProAir HFA, Xyzal 7m qhs Using maintenance inhaler regularly? Yes Frequency of rescue inhaler use:  infrequently  We discussed:   Using Advair daily? Yes Rescue inhaler once montly   Plan  Continue current medications  Medication Management   We discussed:  Increased Aricept? Yes, effective Wonders if it's just bad memory  Plan  Continue current medication management strategy  Follow up: 3 month phone visit  TMilus Height PharmD, [phone]

## 2020-03-23 NOTE — Patient Instructions (Addendum)
Visit Information  Goals Addressed            This Visit's Progress   . Chronic Care Management       CARE PLAN ENTRY  Current Barriers:  . Chronic Disease Management support, education, and care coordination needs related to Hypertension, Hyperlipidemia, Diabetes, Asthma, and Post herpetic neuroalgia   Hypertension . Pharmacist Clinical Goal(s): o Over the next 90 days, patient will work with PharmD and providers to maintain BP goal <130/80 . Current regimen:  o Furosemide and spironolactone . Interventions: o Office readings are high, but normal at home o Patient to take BP at home . Patient self care activities - Over the next 90 days, patient will: o Check BP daily, document, and provide at future appointments o Ensure daily salt intake < 2300 mg/day  Diabetes . Pharmacist Clinical Goal(s): o Over the next 90 days, patient will work with PharmD and providers to achieve A1c goal <7% . Current regimen:  o None o Patient has had severe adverse effects or an allergic reaction to all major diabetes medications . Interventions: o Rechallenge with Ozempic if provider agrees . Patient self care activities - Over the next 90 days, patient will: o Check blood sugar once daily, document, and provide at future appointments o Contact provider with any episodes of hypoglycemia  Asthma . Pharmacist Clinical Goal(s) o Over the next 90 days, patient will work with PharmD and providers to reduce rescue inhaler use . Current regimen:  o Advair twice daily, ProAir as needed (about once monthly) . Interventions: o Counseled that Advair is a maintenance inhaler which should be used daily . Patient self care activities - Over the next 90 days, patient will: o Now taking Advair as directed  Medication management . Pharmacist Clinical Goal(s): o Over the next 90 days, patient will work with PharmD and providers to achieve optimal medication adherence . Current pharmacy:  [facility] . Interventions o Comprehensive medication review performed. o Continue current medication management strategy . Patient self care activities - Over the next 90 days, patient will: o Take medications as prescribed o Report any questions or concerns to PharmD and/or provider(s)  Initial goal documentation        Print copy of patient instructions provided.   Telephone follow up appointment with pharmacy team member scheduled for: 3 months  Milus Height, PharmD, [place], [place] [phone]   Asthma, Adult  Asthma is a long-term (chronic) condition in which the airways get tight and narrow. The airways are the breathing passages that lead from the nose and mouth down into the lungs. A person with asthma will have times when symptoms get worse. These are called asthma attacks. They can cause coughing, whistling sounds when you breathe (wheezing), shortness of breath, and chest pain. They can make it hard to breathe. There is no cure for asthma, but medicines and lifestyle changes can help control it. There are many things that can bring on an asthma attack or make asthma symptoms worse (triggers). Common triggers include:  Mold.  Dust.  Cigarette smoke.  Cockroaches.  Things that can cause allergy symptoms (allergens). These include animal skin flakes (dander) and pollen from trees or grass.  Things that pollute the air. These may include household cleaners, wood smoke, smog, or chemical odors.  Cold air, weather changes, and wind.  Crying or laughing hard.  Stress.  Certain medicines or drugs.  Certain foods such as dried fruit, potato chips, and grape  juice.  Infections, such as a cold or the flu.  Certain medical conditions or diseases.  Exercise or tiring activities. Asthma may be treated with medicines and by staying away from the things that cause asthma attacks. Types of medicines may include:  Controller  medicines. These help prevent asthma symptoms. They are usually taken every day.  Fast-acting reliever or rescue medicines. These quickly relieve asthma symptoms. They are used as needed and provide short-term relief.  Allergy medicines if your attacks are brought on by allergens.  Medicines to help control the body's defense (immune) system. Follow these instructions at home: Avoiding triggers in your home  Change your heating and air conditioning filter often.  Limit your use of fireplaces and wood stoves.  Get rid of pests (such as roaches and mice) and their droppings.  Throw away plants if you see mold on them.  Clean your floors. Dust regularly. Use cleaning products that do not smell.  Have someone vacuum when you are not home. Use a vacuum cleaner with a HEPA filter if possible.  Replace carpet with wood, tile, or vinyl flooring. Carpet can trap animal skin flakes and dust.  Use allergy-proof pillows, mattress covers, and box spring covers.  Wash bed sheets and blankets every week in hot water. Dry them in a dryer.  Keep your bedroom free of any triggers.  Avoid pets and keep windows closed when things that cause allergy symptoms are in the air.  Use blankets that are made of polyester or cotton.  Clean bathrooms and kitchens with bleach. If possible, have someone repaint the walls in these rooms with mold-resistant paint. Keep out of the rooms that are being cleaned and painted.  Wash your hands often with soap and water. If soap and water are not available, use hand sanitizer.  Do not allow anyone to smoke in your home. General instructions  Take over-the-counter and prescription medicines only as told by your doctor. ? Talk with your doctor if you have questions about how or when to take your medicines. ? Make note if you need to use your medicines more often than usual.  Do not use any products that contain nicotine or tobacco, such as cigarettes and  e-cigarettes. If you need help quitting, ask your doctor.  Stay away from secondhand smoke.  Avoid doing things outdoors when allergen counts are high and when air quality is low.  Wear a ski mask when doing outdoor activities in the winter. The mask should cover your nose and mouth. Exercise indoors on cold days if you can.  Warm up before you exercise. Take time to cool down after exercise.  Use a peak flow meter as told by your doctor. A peak flow meter is a tool that measures how well the lungs are working.  Keep track of the peak flow meter's readings. Write them down.  Follow your asthma action plan. This is a written plan for taking care of your asthma and treating your attacks.  Make sure you get all the shots (vaccines) that your doctor recommends. Ask your doctor about a flu shot and a pneumonia shot.  Keep all follow-up visits as told by your doctor. This is important. Contact a doctor if:  You have wheezing, shortness of breath, or a cough even while taking medicine to prevent attacks.  The mucus you cough up (sputum) is thicker than usual.  The mucus you cough up changes from clear or white to yellow, green, gray, or bloody.  You  have problems from the medicine you are taking, such as: ? A rash. ? Itching. ? Swelling. ? Trouble breathing.  You need reliever medicines more than 2-3 times a week.  Your peak flow reading is still at 50-79% of your personal best after following the action plan for 1 hour.  You have a fever. Get help right away if:  You seem to be worse and are not responding to medicine during an asthma attack.  You are short of breath even at rest.  You get short of breath when doing very little activity.  You have trouble eating, drinking, or talking.  You have chest pain or tightness.  You have a fast heartbeat.  Your lips or fingernails start to turn blue.  You are light-headed or dizzy, or you faint.  Your peak flow is less than  50% of your personal best.  You feel too tired to breathe normally. Summary  Asthma is a long-term (chronic) condition in which the airways get tight and narrow. An asthma attack can make it hard to breathe.  Asthma cannot be cured, but medicines and lifestyle changes can help control it.  Make sure you understand how to avoid triggers and how and when to use your medicines. This information is not intended to replace advice given to you by your health care provider. Make sure you discuss any questions you have with your health care provider. Document Revised: 09/15/2018 Document Reviewed: 08/17/2016 Elsevier Patient Education  2020 [employer].

## 2020-03-26 DIAGNOSIS — J3089 Other allergic rhinitis: Secondary | ICD-10-CM | POA: Diagnosis not present

## 2020-03-26 DIAGNOSIS — J301 Allergic rhinitis due to pollen: Secondary | ICD-10-CM | POA: Diagnosis not present

## 2020-03-28 DIAGNOSIS — H02834 Dermatochalasis of left upper eyelid: Secondary | ICD-10-CM | POA: Diagnosis not present

## 2020-03-28 DIAGNOSIS — H02831 Dermatochalasis of right upper eyelid: Secondary | ICD-10-CM | POA: Diagnosis not present

## 2020-04-04 DIAGNOSIS — J3089 Other allergic rhinitis: Secondary | ICD-10-CM | POA: Diagnosis not present

## 2020-04-04 DIAGNOSIS — J301 Allergic rhinitis due to pollen: Secondary | ICD-10-CM | POA: Diagnosis not present

## 2020-04-09 ENCOUNTER — Other Ambulatory Visit: Payer: Self-pay | Admitting: Family Medicine

## 2020-04-09 DIAGNOSIS — R413 Other amnesia: Secondary | ICD-10-CM

## 2020-04-09 NOTE — Telephone Encounter (Signed)
Requested Prescriptions  Pending Prescriptions Disp Refills  . donepezil (ARICEPT) 10 MG tablet [Pharmacy Med Name: DONEPEZIL 10MG  TABLETS] 90 tablet 0    Sig: TAKE 1 TABLET(10 MG) BY MOUTH DAILY     Neurology:  Alzheimer's Agents Passed - 04/09/2020  3:39 AM      Passed - Valid encounter within last 6 months    Recent Outpatient Visits          3 months ago Type 2 diabetes mellitus with other specified complication, without long-term current use of insulin [facility])   [facility] Birdie Sons, MD   4 months ago Eczema, unspecified type   [place], [place], PA-C   7 months ago Shortness of breath   [facility], Kelby Aline, FNP   8 months ago Other postherpetic nervous system involvement   [facility] Birdie Sons, MD   11 months ago Type 2 diabetes mellitus with other specified complication, without long-term current use of insulin [facility])   [facility] Caryn Section, Kirstie Peri, MD

## 2020-04-16 ENCOUNTER — Telehealth: Payer: Self-pay

## 2020-04-16 DIAGNOSIS — J301 Allergic rhinitis due to pollen: Secondary | ICD-10-CM | POA: Diagnosis not present

## 2020-04-16 DIAGNOSIS — J3089 Other allergic rhinitis: Secondary | ICD-10-CM | POA: Diagnosis not present

## 2020-04-16 NOTE — Telephone Encounter (Signed)
Copied from [place] [id number]. Topic: Referral - Request for Referral >> Apr 16, 2020 10:58 AM Gillis Ends D wrote: Has patient seen PCP for this complaint? No. Something new just her toe *If NO, is insurance requiring patient see PCP for this issue before PCP can refer them? Referral for which specialty: Podiatrist Preferred provider/office It doesn't matter maybe [facility] Reason for referral: Gout pain in toe and she is diabetic and she is trying to stay on top of things.

## 2020-04-16 NOTE — Telephone Encounter (Signed)
Left message to call back. If pt is having gout, this could be managed here at the office. Pt can schedule an OV with PCP or another provider to discuss treatment. If she is requesting a referral, this would need to be addressed at an OV also. Ok for [facility] to schedule and advise pt.

## 2020-04-16 NOTE — Telephone Encounter (Signed)
Pt called back and I told her I could schedule her an appt.  She declined saying she would just address this at er next [place].

## 2020-04-23 ENCOUNTER — Ambulatory Visit: Payer: Medicare Other | Admitting: Physician Assistant

## 2020-04-23 DIAGNOSIS — J3089 Other allergic rhinitis: Secondary | ICD-10-CM | POA: Diagnosis not present

## 2020-04-23 DIAGNOSIS — J301 Allergic rhinitis due to pollen: Secondary | ICD-10-CM | POA: Diagnosis not present

## 2020-04-23 NOTE — Progress Notes (Deleted)
Established patient visit   Patient: Sharon Nelson   DOB: 04/25/1951   69 y.o. Female  MRN: [id number] Visit Date: 04/24/2020  Today's healthcare provider: Jennifer M Burnette, PA-C   No chief complaint on file.  Subjective    HPI  ***  {Show patient history (optional):23778::" "}   Medications: Outpatient Medications Prior to Visit  Medication Sig  . ACCU-CHEK AVIVA PLUS test strip USE TO TEST ONCE DAILY AS DIRECTED  . ADVAIR HFA 115-21 MCG/ACT inhaler Inhale 2 puffs into the lungs 2 (two) times daily.   . albuterol (PROAIR HFA) 108 (90 BASE) MCG/ACT inhaler Inhale into the lungs every 4 (four) hours as needed.   . AMITIZA 24 MCG capsule TAKE 1 CAPSULE BY MOUTH TWICE DAILY  . aspirin 81 MG tablet Take 81 mg by mouth daily.   . BIOTIN PO Take by mouth daily. When remembers  . Blood Glucose Monitoring Suppl (ACCU-CHEK AVIVA PLUS) w/Device KIT Use to check blood sugar for type 2 diabetes.  . Cholecalciferol 2000 UNITS CAPS Take by mouth daily.   . Cyanocobalamin (VITAMIN B 12 PO) Take by mouth daily. When remembers  . donepezil (ARICEPT) 10 MG tablet TAKE 1 TABLET(10 MG) BY MOUTH DAILY  . EPINEPHrine 0.3 mg/0.3 mL IJ SOAJ injection   . estradiol (ESTRACE) 0.5 MG tablet TAKE 1 TABLET BY MOUTH EVERY DAY  . furosemide (LASIX) 20 MG tablet TAKE 1 TABLET(20 MG) BY MOUTH DAILY AS NEEDED FOR SWELLING  . gabapentin (NEURONTIN) 600 MG tablet TAKE 1 TABLET BY MOUTH THREE TIMES DAILY  . glipiZIDE (GLUCOTROL) 5 MG tablet Take 1 tablet (5 mg total) by mouth daily before breakfast.  . HYDROcodone-acetaminophen (NORCO/VICODIN) 5-325 MG tablet Take 1-2 tablets by mouth every 6 (six) hours as needed for moderate pain or severe pain. (Patient not taking: Reported on 12/12/2019)  . levocetirizine (XYZAL) 5 MG tablet Take 5 mg by mouth every evening.   . 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 REMOVE AND DISCARD AFTER 12 HOURS OR AS DIRECTED BY  DOCTOR  . lovastatin (MEVACOR) 40 MG tablet Take 1 tablet by mouth at bedtime  . mometasone (ELOCON) 0.1 % cream APP EXT AA BID  . spironolactone (ALDACTONE) 100 MG tablet TAKE 1 TABLET BY MOUTH EVERY DAY FOR BLOOD PRESSURE OR SWELLING  . triamcinolone cream (KENALOG) 0.1 % Apply 1 application topically 2 (two) times daily. (Patient not taking: Reported on 12/18/2019)   No facility-administered medications prior to visit.    Review of Systems  {Heme  Chem  Endocrine  Serology  Results Review (optional):23779::" "}  Objective    There were no vitals taken for this visit. {Show previous vital signs (optional):23777::" "}  Physical Exam  ***  No results found for any visits on 04/24/20.  Assessment & Plan     ***  No follow-ups on file.      {provider attestation***:1}   Jennifer M Burnette, PA-C  [facility] [phone] (phone) [phone] (fax)  [facility] 

## 2020-04-24 ENCOUNTER — Ambulatory Visit: Payer: Medicare Other | Admitting: Physician Assistant

## 2020-04-25 DIAGNOSIS — E785 Hyperlipidemia, unspecified: Secondary | ICD-10-CM | POA: Diagnosis not present

## 2020-04-25 DIAGNOSIS — E1169 Type 2 diabetes mellitus with other specified complication: Secondary | ICD-10-CM | POA: Diagnosis not present

## 2020-04-25 DIAGNOSIS — E1159 Type 2 diabetes mellitus with other circulatory complications: Secondary | ICD-10-CM | POA: Diagnosis not present

## 2020-04-25 DIAGNOSIS — E1165 Type 2 diabetes mellitus with hyperglycemia: Secondary | ICD-10-CM | POA: Diagnosis not present

## 2020-04-25 DIAGNOSIS — I152 Hypertension secondary to endocrine disorders: Secondary | ICD-10-CM | POA: Diagnosis not present

## 2020-04-25 LAB — HEMOGLOBIN A1C: Hemoglobin A1C: 8.4

## 2020-04-26 ENCOUNTER — Ambulatory Visit: Payer: Medicare Other | Admitting: Physician Assistant

## 2020-04-30 DIAGNOSIS — J3089 Other allergic rhinitis: Secondary | ICD-10-CM | POA: Diagnosis not present

## 2020-04-30 DIAGNOSIS — J3081 Allergic rhinitis due to animal (cat) (dog) hair and dander: Secondary | ICD-10-CM | POA: Diagnosis not present

## 2020-04-30 DIAGNOSIS — J301 Allergic rhinitis due to pollen: Secondary | ICD-10-CM | POA: Diagnosis not present

## 2020-05-01 ENCOUNTER — Other Ambulatory Visit: Payer: Self-pay | Admitting: Family Medicine

## 2020-05-01 NOTE — Telephone Encounter (Signed)
Requested medication (s) are due for refill today: yes  Requested medication (s) are on the active medication list: yes  Last refill:  02/14/2019 #30 5 refills   Future visit scheduled: no  Notes to clinic:  expired, do you want to renew Rx?     Requested Prescriptions  Pending Prescriptions Disp Refills   furosemide (LASIX) 20 MG tablet [Pharmacy Med Name: FUROSEMIDE 20MG  TABLETS] 30 tablet 5    Sig: TAKE 1 TABLET(20 MG) BY MOUTH DAILY AS NEEDED FOR SWELLING      Cardiovascular:  Diuretics - Loop Failed - 05/01/2020  8:02 PM      Failed - Last BP in normal range    BP Readings from Last 1 Encounters:  12/12/19 (!) 150/70          Passed - K in normal range and within 360 days    Potassium  Date Value Ref Range Status  08/17/2019 3.7 3.5 - 5.1 mmol/L Final  02/12/2014 4.4 3.5 - 5.1 mmol/L Final          Passed - Ca in normal range and within 360 days    Calcium  Date Value Ref Range Status  08/17/2019 9.1 8.9 - 10.3 mg/dL Final   Calcium, Total  Date Value Ref Range Status  02/12/2014 8.0 (L) 8.5 - 10.1 mg/dL Final          Passed - Na in normal range and within 360 days    Sodium  Date Value Ref Range Status  08/17/2019 135 135 - 145 mmol/L Final  09/08/2018 139 134 - 144 mmol/L Final  02/12/2014 142 136 - 145 mmol/L Final          Passed - Cr in normal range and within 360 days    Creatinine  Date Value Ref Range Status  02/12/2014 1.04 0.60 - 1.30 mg/dL Final   Creatinine, Ser  Date Value Ref Range Status  08/17/2019 0.94 0.44 - 1.00 mg/dL Final   Creatinine, POC  Date Value Ref Range Status  05/15/2019 n/a mg/dL Final          Passed - Valid encounter within last 6 months    Recent Outpatient Visits           4 months ago Type 2 diabetes mellitus with other specified complication, without long-term current use of insulin [facility])   [facility] Birdie Sons, MD   5 months ago Eczema, unspecified type   [place], [place], PA-C   8 months ago Shortness of breath   [facility], Kelby Aline, FNP   8 months ago Other postherpetic nervous system involvement   [facility] Birdie Sons, MD   11 months ago Type 2 diabetes mellitus with other specified complication, without long-term current use of insulin [facility])   [facility] Birdie Sons, MD

## 2020-05-07 DIAGNOSIS — J3081 Allergic rhinitis due to animal (cat) (dog) hair and dander: Secondary | ICD-10-CM | POA: Diagnosis not present

## 2020-05-07 DIAGNOSIS — J3089 Other allergic rhinitis: Secondary | ICD-10-CM | POA: Diagnosis not present

## 2020-05-07 DIAGNOSIS — J301 Allergic rhinitis due to pollen: Secondary | ICD-10-CM | POA: Diagnosis not present

## 2020-05-08 ENCOUNTER — Other Ambulatory Visit: Payer: Self-pay | Admitting: Family Medicine

## 2020-05-16 DIAGNOSIS — J301 Allergic rhinitis due to pollen: Secondary | ICD-10-CM | POA: Diagnosis not present

## 2020-05-16 DIAGNOSIS — J3089 Other allergic rhinitis: Secondary | ICD-10-CM | POA: Diagnosis not present

## 2020-05-21 DIAGNOSIS — J3081 Allergic rhinitis due to animal (cat) (dog) hair and dander: Secondary | ICD-10-CM | POA: Diagnosis not present

## 2020-05-21 DIAGNOSIS — J301 Allergic rhinitis due to pollen: Secondary | ICD-10-CM | POA: Diagnosis not present

## 2020-05-21 DIAGNOSIS — J3089 Other allergic rhinitis: Secondary | ICD-10-CM | POA: Diagnosis not present

## 2020-05-28 DIAGNOSIS — J3089 Other allergic rhinitis: Secondary | ICD-10-CM | POA: Diagnosis not present

## 2020-05-28 DIAGNOSIS — J301 Allergic rhinitis due to pollen: Secondary | ICD-10-CM | POA: Diagnosis not present

## 2020-06-11 DIAGNOSIS — J301 Allergic rhinitis due to pollen: Secondary | ICD-10-CM | POA: Diagnosis not present

## 2020-06-11 DIAGNOSIS — J3089 Other allergic rhinitis: Secondary | ICD-10-CM | POA: Diagnosis not present

## 2020-06-13 ENCOUNTER — Ambulatory Visit (INDEPENDENT_AMBULATORY_CARE_PROVIDER_SITE_OTHER): Payer: Medicare Other

## 2020-06-13 ENCOUNTER — Other Ambulatory Visit: Payer: Self-pay

## 2020-06-13 DIAGNOSIS — Z23 Encounter for immunization: Secondary | ICD-10-CM

## 2020-06-13 DIAGNOSIS — J4530 Mild persistent asthma, uncomplicated: Secondary | ICD-10-CM | POA: Diagnosis not present

## 2020-06-13 DIAGNOSIS — L501 Idiopathic urticaria: Secondary | ICD-10-CM | POA: Diagnosis not present

## 2020-06-13 DIAGNOSIS — J3089 Other allergic rhinitis: Secondary | ICD-10-CM | POA: Diagnosis not present

## 2020-06-13 DIAGNOSIS — L2089 Other atopic dermatitis: Secondary | ICD-10-CM | POA: Diagnosis not present

## 2020-06-18 DIAGNOSIS — J301 Allergic rhinitis due to pollen: Secondary | ICD-10-CM | POA: Diagnosis not present

## 2020-06-18 DIAGNOSIS — J3089 Other allergic rhinitis: Secondary | ICD-10-CM | POA: Diagnosis not present

## 2020-06-24 ENCOUNTER — Ambulatory Visit: Payer: Medicare Other

## 2020-06-24 DIAGNOSIS — E1169 Type 2 diabetes mellitus with other specified complication: Secondary | ICD-10-CM

## 2020-06-24 DIAGNOSIS — I10 Essential (primary) hypertension: Secondary | ICD-10-CM

## 2020-06-24 NOTE — Patient Instructions (Signed)
Visit Information It was great speaking with you today!  Please let me know if you have any questions about our visit. Goals Addressed            This Visit's Progress   . Chronic Care Management       CARE PLAN ENTRY  Current Barriers:  . Chronic Disease Management support, education, and care coordination needs related to Hypertension, Hyperlipidemia, Diabetes, Osteopenia, Allergic Rhinitis, and Memory Concerns   Hypertension BP Readings from Last 3 Encounters:  12/12/19 (!) 150/70  11/22/19 138/75  08/17/19 (!) 165/83   . Pharmacist Clinical Goal(s): o Over the next 90 days, patient will work with PharmD and providers to maintain BP goal <130/80 . Current regimen:  . Furosemide 20 mg daily . Spironolactone 100 mg daily  . Interventions: o Discussed low salt diet and exercising as tolerated extensively o Will initiate blood pressure monitoring plan  . Patient self care activities - Over the next 90 days, patient will: o Check Blood Pressure weekly, document, and provide at future appointments o Ensure daily salt intake < 2300 mg/day  Hyperlipidemia Lab Results  Component Value Date/Time   LDLCALC 84 12/12/2019 10:43 AM   . Pharmacist Clinical Goal(s): o Over the next 90 days, patient will work with PharmD and providers to maintain LDL goal < 100 . Current regimen:  o Lovastatin 40 mg daily . Interventions: o Discussed low cholesterol diet and exercising as tolerated extensively o Will initiate cholesterol monitoring plan   Diabetes Lab Results  Component Value Date/Time   HGBA1C 7.0 (A) 12/12/2019 10:09 AM   HGBA1C 7.7 (A) 08/11/2019 02:43 PM   HGBA1C 8.7 (H) 09/08/2018 10:12 AM   HGBA1C 8.4 (H) 07/30/2017 11:25 AM   HGBA1C 8.0 09/25/2016 12:00 AM   HGBA1C 7.8 (H) 02/10/2014 06:33 AM   . Pharmacist Clinical Goal(s): o Over the next 90 days, patient will work with PharmD and providers to achieve A1c goal <7% . Current regimen:  o Ozempic 0.5 mg  weekly . Interventions: o Discussed carbohydrate counting and exercising as tolerated extensively o Will initiate blood sugar monitoring plan  . Patient self care activities - Over the next 90 days, patient will: o Check blood sugar 3-4 times daily using FreeStyle Libre, document, and provide at future appointments o Contact provider with any episodes of hypoglycemia  Medication management . Pharmacist Clinical Goal(s): o Over the next 90 days, patient will work with PharmD and providers to achieve optimal medication adherence . Current pharmacy: [facility] . Interventions o Comprehensive medication review performed. o Continue current medication management strategy . Patient self care activities - Over the next 90 days, patient will: o Take medications as prescribed o Report any questions or concerns to PharmD and/or provider(s)       The patient verbalized understanding of instructions, educational materials, and care plan provided today and declined offer to receive copy of patient instructions, educational materials, and care plan.   Telephone follow up appointment with pharmacy team member scheduled for: 09/27/20 at 9:00 AM  [place] [phone]

## 2020-06-24 NOTE — Chronic Care Management (AMB) (Signed)
Chronic Care Management Pharmacy  Name: Sharon Nelson  MRN: [id number] DOB: 1951/07/02  Chief Complaint/ HPI  Sharon Nelson,  69 y.o. , female presents for their Follow-Up CCM visit with the clinical pharmacist via telephone.  PCP : Birdie Sons, MD  Their chronic conditions include: Hypertension, Hyperlipidemia, Diabetes, Osteopenia, Allergic Rhinitis, and Memory Concerns     Office Visits: 12/12/19: Patient presented to Dr. Caryn Section for follow-up. BP 150/70. Donepezil increased to 10 mg daily. Janumet stopped for 2 weeks to see if rash improves.   Consult Visit: 04/25/20: Patient presented to Dr. Honor Junes (Endo). Patient started on Ozempic 0.25 mg weekly   Medications: Outpatient Encounter Medications as of 06/24/2020  Medication Sig Note  . ACCU-CHEK AVIVA PLUS test strip USE TO TEST ONCE DAILY AS DIRECTED   . ADVAIR HFA 115-21 MCG/ACT inhaler Inhale 2 puffs into the lungs 2 (two) times daily.    Marland Kitchen albuterol (PROAIR HFA) 108 (90 BASE) MCG/ACT inhaler Inhale into the lungs every 4 (four) hours as needed.  01/25/2015: Received from: [place]  . AMITIZA 24 MCG capsule TAKE 1 CAPSULE BY MOUTH TWICE DAILY   . aspirin 81 MG tablet Take 81 mg by mouth daily.  01/25/2015: Received from: [place]  . BIOTIN PO Take by mouth daily. When remembers   . Blood Glucose Monitoring Suppl (ACCU-CHEK AVIVA PLUS) w/Device KIT Use to check blood sugar for type 2 diabetes.   . Cholecalciferol 2000 UNITS CAPS Take by mouth daily.  01/25/2015: Received from: [place]  . Cyanocobalamin (VITAMIN B 12 PO) Take by mouth daily. When remembers   . donepezil (ARICEPT) 10 MG tablet TAKE 1 TABLET(10 MG) BY MOUTH DAILY   . EPINEPHrine 0.3 mg/0.3 mL IJ SOAJ injection  02/05/2016: PRN   . estradiol (ESTRACE) 0.5 MG tablet TAKE 1 TABLET BY MOUTH EVERY DAY   . furosemide (LASIX) 20 MG tablet TAKE 1 TABLET(20 MG) BY MOUTH DAILY AS NEEDED FOR SWELLING   .  gabapentin (NEURONTIN) 600 MG tablet TAKE 1 TABLET BY MOUTH THREE TIMES DAILY   . glipiZIDE (GLUCOTROL) 5 MG tablet Take 1 tablet (5 mg total) by mouth daily before breakfast.   . HYDROcodone-acetaminophen (NORCO/VICODIN) 5-325 MG tablet Take 1-2 tablets by mouth every 6 (six) hours as needed for moderate pain or severe pain. (Patient not taking: Reported on 12/12/2019)   . levocetirizine (XYZAL) 5 MG tablet Take 5 mg by mouth every evening.  01/25/2015: Received from: [place]  . 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 REMOVE AND DISCARD AFTER 12 HOURS OR AS DIRECTED BY DOCTOR   . lovastatin (MEVACOR) 40 MG tablet Take 1 tablet by mouth at bedtime 01/25/2015: Received from: [facility]  . mometasone (ELOCON) 0.1 % cream APP EXT AA BID   . spironolactone (ALDACTONE) 100 MG tablet TAKE 1 TABLET BY MOUTH EVERY DAY FOR BLOOD PRESSURE OR SWELLING   . triamcinolone cream (KENALOG) 0.1 % Apply 1 application topically 2 (two) times daily. (Patient not taking: Reported on 12/18/2019)    No facility-administered encounter medications on file as of 06/24/2020.   Current Diagnosis/Assessment:  SDOH Interventions     Most Recent Value  SDOH Interventions  Financial Strain Interventions Intervention Not Indicated  Transportation Interventions Intervention Not Indicated      Goals Addressed            This Visit's  Progress   . Chronic Care Management       CARE PLAN ENTRY  Current Barriers:  . Chronic Disease Management support, education, and care coordination needs related to Hypertension, Hyperlipidemia, Diabetes, Osteopenia, Allergic Rhinitis, and Memory Concerns   Hypertension BP Readings from Last 3 Encounters:  12/12/19 (!) 150/70  11/22/19 138/75  08/17/19 (!) 165/83   . Pharmacist Clinical Goal(s): o Over the next 90 days, patient will work with PharmD and providers to maintain BP goal <130/80 .  Current regimen:  . Furosemide 20 mg daily . Spironolactone 100 mg daily  . Interventions: o Discussed low salt diet and exercising as tolerated extensively o Will initiate blood pressure monitoring plan  . Patient self care activities - Over the next 90 days, patient will: o Check Blood Pressure weekly, document, and provide at future appointments o Ensure daily salt intake < 2300 mg/day  Hyperlipidemia Lab Results  Component Value Date/Time   LDLCALC 84 12/12/2019 10:43 AM   . Pharmacist Clinical Goal(s): o Over the next 90 days, patient will work with PharmD and providers to maintain LDL goal < 100 . Current regimen:  o Lovastatin 40 mg daily . Interventions: o Discussed low cholesterol diet and exercising as tolerated extensively o Will initiate cholesterol monitoring plan   Diabetes Lab Results  Component Value Date/Time   HGBA1C 7.0 (A) 12/12/2019 10:09 AM   HGBA1C 7.7 (A) 08/11/2019 02:43 PM   HGBA1C 8.7 (H) 09/08/2018 10:12 AM   HGBA1C 8.4 (H) 07/30/2017 11:25 AM   HGBA1C 8.0 09/25/2016 12:00 AM   HGBA1C 7.8 (H) 02/10/2014 06:33 AM   . Pharmacist Clinical Goal(s): o Over the next 90 days, patient will work with PharmD and providers to achieve A1c goal <7% . Current regimen:  o Ozempic 0.5 mg weekly . Interventions: o Discussed carbohydrate counting and exercising as tolerated extensively o Will initiate blood sugar monitoring plan  . Patient self care activities - Over the next 90 days, patient will: o Check blood sugar 3-4 times daily using FreeStyle Libre, document, and provide at future appointments o Contact provider with any episodes of hypoglycemia  Medication management . Pharmacist Clinical Goal(s): o Over the next 90 days, patient will work with PharmD and providers to achieve optimal medication adherence . Current pharmacy: [facility] . Interventions o Comprehensive medication review performed. o Continue current medication management strategy .  Patient self care activities - Over the next 90 days, patient will: o Take medications as prescribed o Report any questions or concerns to PharmD and/or provider(s)      Hypertension   BP goal is:  <130/80  Office blood pressures are  BP Readings from Last 3 Encounters:  12/12/19 (!) 150/70  11/22/19 138/75  08/17/19 (!) 165/83   Patient checks BP at home infrequently Patient home BP readings are ranging: n/a  Patient has failed these meds in the past: Lisinopril, Verapamil,  Patient is currently controlled on the following medications:  . Furosemide 20 mg daily PRN  . Spironolactone 100 mg daily   We discussed diet and exercise extensively  Plan  Continue current medications   Hyperlipidemia   LDL goal < 100  Last lipids Lab Results  Component Value Date   CHOL 164 12/12/2019   HDL 36 (L) 12/12/2019   LDLCALC 84 12/12/2019   TRIG 264 (H) 12/12/2019   CHOLHDL 4.6 (H) 12/12/2019   Hepatic Function Latest Ref Rng & Units 08/17/2019 07/31/2019 09/08/2018  Total Protein 6.5 - 8.1 g/dL 7.8  7.8 7.3  Albumin 3.5 - 5.0 g/dL 3.8 4.0 4.3  AST 15 - 41 U/L '19 25 14  ' ALT 0 - 44 U/L '21 21 15  ' Alk Phosphatase 38 - 126 U/L 49 62 74  Total Bilirubin 0.3 - 1.2 mg/dL 0.2(L) 0.4 <0.2  Bilirubin, Direct 0.0 - 0.2 mg/dL - <0.1 -     The 10-year ASCVD risk score Mikey Bussing DC Jr., et al., 2013) is: 17.2%   Values used to calculate the score:     Age: 51 years     Sex: Female     Is Non-Hispanic African American: Yes     Diabetic: Yes     Tobacco smoker: No     Systolic Blood Pressure: 258 mmHg     Is BP treated: Yes     HDL Cholesterol: 36 mg/dL     Total Cholesterol: 164 mg/dL   Patient has failed these meds in past: n/a Patient is currently controlled on the following medications:  . Lovastatin 40 mg daily  We discussed:  diet and exercise extensively  Plan  Continue current medications  Diabetes   Recent Relevant Labs: Lab Results  Component Value Date/Time   HGBA1C  7.0 (A) 12/12/2019 10:09 AM   HGBA1C 7.7 (A) 08/11/2019 02:43 PM   HGBA1C 8.7 (H) 09/08/2018 10:12 AM   HGBA1C 8.4 (H) 07/30/2017 11:25 AM   HGBA1C 8.0 09/25/2016 12:00 AM   HGBA1C 7.8 (H) 02/10/2014 06:33 AM   MICROALBUR negative 05/15/2019 09:14 AM   MICROALBUR 20 02/23/2018 10:00 AM     A1c 8.4% (04/25/20): [facility]  Checking BG: Uses FreeStyle Libre 2   Patient has failed these meds in past: Metformin, Actos, Farxiga, Staglatro, Sitagliptin, Janumet, Trulicity, Ozempic.   Patient is currently uncontrolled on the following medications:   Ozempic 0.5 mg weekly   Last diabetic Foot exam:  Lab Results  Component Value Date/Time   HMDIABEYEEXA No Retinopathy 12/07/2019 12:00 AM    Last diabetic Eye exam: No results found for: HMDIABFOOTEX   We discussed: Does have slight rash on her legs, not as severe as previous Ozempic attempt. Some lows due to overall low appetite, something she struggled with prior to starting Ozempic. Does not feel Ozempic has changed her appetite too much.   Plan  Continue current medications.   Asthma   Eosinophil count:   Lab Results  Component Value Date/Time   EOSPCT 0.1 02/11/2014 04:21 AM  %                               Eos (Absolute):  Lab Results  Component Value Date/Time   EOSABS 0.2 02/23/2018 10:06 AM   EOSABS 0.0 02/11/2014 04:21 AM    Tobacco Status:  Social History   Tobacco Use  Smoking Status Former Smoker  . Packs/day: 0.75  . Years: 22.00  . Pack years: 16.50  . Types: Cigarettes  . Quit date: 07/27/1985  . Years since quitting: 34.9  Smokeless Tobacco Never Used  Tobacco Comment   smoked as teenager    Patient has failed these meds in past: NA Patient is currently controlled on the following medications:   Advair HFA 115/21  ProAir HFA  Xyzal 38m qhs  Using maintenance inhaler regularly? Yes Frequency of rescue inhaler use:  1x monthly  Plan  Continue current medications  Medication Management    Plan  Continue current medication management strategy  Follow up: 3  month phone visit  [place] [phone]

## 2020-06-25 DIAGNOSIS — J3081 Allergic rhinitis due to animal (cat) (dog) hair and dander: Secondary | ICD-10-CM | POA: Diagnosis not present

## 2020-06-25 DIAGNOSIS — J3089 Other allergic rhinitis: Secondary | ICD-10-CM | POA: Diagnosis not present

## 2020-06-25 DIAGNOSIS — J301 Allergic rhinitis due to pollen: Secondary | ICD-10-CM | POA: Diagnosis not present

## 2020-07-02 DIAGNOSIS — J301 Allergic rhinitis due to pollen: Secondary | ICD-10-CM | POA: Diagnosis not present

## 2020-07-02 DIAGNOSIS — J3081 Allergic rhinitis due to animal (cat) (dog) hair and dander: Secondary | ICD-10-CM | POA: Diagnosis not present

## 2020-07-02 DIAGNOSIS — J3089 Other allergic rhinitis: Secondary | ICD-10-CM | POA: Diagnosis not present

## 2020-07-09 DIAGNOSIS — J3089 Other allergic rhinitis: Secondary | ICD-10-CM | POA: Diagnosis not present

## 2020-07-09 DIAGNOSIS — J301 Allergic rhinitis due to pollen: Secondary | ICD-10-CM | POA: Diagnosis not present

## 2020-07-16 DIAGNOSIS — J301 Allergic rhinitis due to pollen: Secondary | ICD-10-CM | POA: Diagnosis not present

## 2020-07-16 DIAGNOSIS — J3081 Allergic rhinitis due to animal (cat) (dog) hair and dander: Secondary | ICD-10-CM | POA: Diagnosis not present

## 2020-07-16 DIAGNOSIS — J3089 Other allergic rhinitis: Secondary | ICD-10-CM | POA: Diagnosis not present

## 2020-07-18 ENCOUNTER — Other Ambulatory Visit: Payer: Self-pay | Admitting: Family Medicine

## 2020-07-18 NOTE — Telephone Encounter (Signed)
Requested Prescriptions  Pending Prescriptions Disp Refills  . spironolactone (ALDACTONE) 100 MG tablet [Pharmacy Med Name: SPIRONOLACTONE 100MG  TABLETS] 30 tablet 0    Sig: TAKE 1 TABLET BY MOUTH EVERY DAY FOR BLOOD PRESSURE OR SWELLING     Cardiovascular: Diuretics - Aldosterone Antagonist Failed - 07/18/2020 12:17 PM      Failed - Last BP in normal range    BP Readings from Last 1 Encounters:  12/12/19 (!) 150/70         Failed - Valid encounter within last 6 months    Recent Outpatient Visits          7 months ago Type 2 diabetes mellitus with other specified complication, without long-term current use of insulin ([place])   [facility] Birdie Sons, MD   7 months ago Eczema, unspecified type   [facility] Carles Collet M, PA-C   11 months ago Shortness of breath   [facility], Kelby Aline, FNP   11 months ago Other postherpetic nervous system involvement   [facility] Birdie Sons, MD   1 year ago Type 2 diabetes mellitus with other specified complication, without long-term current use of insulin [facility])   [facility] Birdie Sons, MD             Passed - Cr in normal range and within 360 days    Creatinine  Date Value Ref Range Status  02/12/2014 1.04 0.60 - 1.30 mg/dL Final   Creatinine, Ser  Date Value Ref Range Status  08/17/2019 0.94 0.44 - 1.00 mg/dL Final   Creatinine, POC  Date Value Ref Range Status  05/15/2019 n/a mg/dL Final         Passed - K in normal range and within 360 days    Potassium  Date Value Ref Range Status  08/17/2019 3.7 3.5 - 5.1 mmol/L Final  02/12/2014 4.4 3.5 - 5.1 mmol/L Final         Passed - Na in normal range and within 360 days    Sodium  Date Value Ref Range Status  08/17/2019 135 135 - 145 mmol/L Final  09/08/2018 139 134 - 144 mmol/L Final  02/12/2014 142 136 - 145 mmol/L Final         Called and left VM for patient to  schedule appointment for 6 month check up. Gave 30 day courtesy refill

## 2020-07-22 DIAGNOSIS — M25461 Effusion, right knee: Secondary | ICD-10-CM | POA: Diagnosis not present

## 2020-07-22 DIAGNOSIS — M12561 Traumatic arthropathy, right knee: Secondary | ICD-10-CM | POA: Diagnosis not present

## 2020-07-22 DIAGNOSIS — Z96651 Presence of right artificial knee joint: Secondary | ICD-10-CM | POA: Diagnosis not present

## 2020-07-23 DIAGNOSIS — J3089 Other allergic rhinitis: Secondary | ICD-10-CM | POA: Diagnosis not present

## 2020-07-23 DIAGNOSIS — J301 Allergic rhinitis due to pollen: Secondary | ICD-10-CM | POA: Diagnosis not present

## 2020-07-29 DIAGNOSIS — E119 Type 2 diabetes mellitus without complications: Secondary | ICD-10-CM | POA: Diagnosis not present

## 2020-07-30 ENCOUNTER — Ambulatory Visit: Payer: Medicare Other | Admitting: Family Medicine

## 2020-07-31 ENCOUNTER — Encounter: Payer: Self-pay | Admitting: Ophthalmology

## 2020-07-31 ENCOUNTER — Other Ambulatory Visit: Payer: Self-pay

## 2020-08-01 DIAGNOSIS — J301 Allergic rhinitis due to pollen: Secondary | ICD-10-CM | POA: Diagnosis not present

## 2020-08-01 DIAGNOSIS — J3089 Other allergic rhinitis: Secondary | ICD-10-CM | POA: Diagnosis not present

## 2020-08-05 NOTE — Discharge Instructions (Signed)
INSTRUCTIONS FOLLOWING OCULOPLASTIC SURGERY AMY M. FOWLER, MD  AFTER YOUR EYE SURGERY, THER ARE MANY THINGS WHICH YOU, THE PATIENT, CAN DO TO ASSURE THE BEST POSSIBLE RESULT FROM YOUR OPERATION.  THIS SHEET SHOULD BE REFERRED TO WHENEVER QUESTIONS ARISE.  IF THERE ARE ANY QUESTIONS NOT ANSWERED HERE, DO NOT HESITATE TO CALL OUR OFFICE AT [phone] OR [phone].  THERE IS ALWAYS SOMEONE AVAILABLE TO CALL IF QUESTIONS OR PROBLEMS ARISE.  VISION: Your vision may be blurred and out of focus after surgery until you are able to stop using your ointment, swelling resolves and your eye(s) heal. This may take 1 to 2 weeks at the least.  If your vision becomes gradually more dim or dark, this is not normal and you need to call our office immediately.  EYE CARE: For the first 48 hours after surgery, use ice packs frequently - "20 minutes on, 20 minutes off" - to help reduce swelling and bruising.  Small bags of frozen peas or corn make good ice packs along with cloths soaked in ice water.  If you are wearing a patch or other type of dressing following surgery, keep this on for the amount of time specified by your doctor.  For the first week following surgery, you will need to treat your stitches with great care.  It is OK to shower, but take care to not allow soapy water to run into your eye(s) to help reduce chances of infection.  You may gently clean the eyelashes and around the eye(s) with cotton balls and sterile water, BUT DO NOT RUB THE STITCHES VIGOROUSLY.  Keeping your stitches moist with ointment will help promote healing with minimal scar formation.  ACTIVITY: When you leave the surgery center, you should go home, rest and be inactive.  The eye(s) may feel scratchy and keeping the eyes closed will allow for faster healing.  The first week following surgery, avoid straining (anything making the face turn red) or lifting over 20 pounds.  Additionally, avoid bending which causes your head to go below  your waist.  Using your eyes will NOT harm them, so feel free to read, watch television, use the computer, etc as desired.  Driving depends on each individual, so check with your doctor if you have questions about driving. Do not wear contact lenses for about 2 weeks.  Do not wear eye makeup for 2 weeks.  Avoid swimming, hot tubs, gardening, and dusting for 1 to 2 weeks to reduce the risk of an infection.  MEDICATIONS:  You will be given a prescription for an ointment to use 4 times a day on your stitches.  You can use the ointment in your eyes if they feel scratchy or irritated.  If you eyelid(s) don't close completely when you sleep, put some ointment in your eyes before bedtime.  EMERGENCY: If you experience SEVERE EYE PAIN OR HEADACHE UNRELIEVED BY TYLENOL OR TRAMADOL, NAUSEA OR VOMITING, WORSENING REDNESS, OR WORSENING VISION (ESPECIALLY VISION THAT WAS INITIALLY BETTER) CALL [phone] OR [phone] DURING BUSINESS HOURS OR AFTER HOURS. General Anesthesia, Adult, Care After This sheet gives you information about how to care for yourself after your procedure. Your health care provider may also give you more specific instructions. If you have problems or questions, contact your health care provider. What can I expect after the procedure? After the procedure, the following side effects are common:  Pain or discomfort at the IV site.  Nausea.  Vomiting.  Sore throat.  Trouble concentrating.  Feeling   cold or chills.  Feeling weak or tired.  Sleepiness and fatigue.  Soreness and body aches. These side effects can affect parts of the body that were not involved in surgery. Follow these instructions at home: For the time period you were told by your health care provider:  Rest.  Do not participate in activities where you could fall or become injured.  Do not drive or use machinery.  Do not drink alcohol.  Do not take sleeping pills or medicines that cause drowsiness.  Do  not make important decisions or sign legal documents.  Do not take care of children on your own.   Eating and drinking  Follow any instructions from your health care provider about eating or drinking restrictions.  When you feel hungry, start by eating small amounts of foods that are soft and easy to digest (bland), such as toast. Gradually return to your regular diet.  Drink enough fluid to keep your urine pale yellow.  If you vomit, rehydrate by drinking water, juice, or clear broth. General instructions  If you have sleep apnea, surgery and certain medicines can increase your risk for breathing problems. Follow instructions from your health care provider about wearing your sleep device: ? Anytime you are sleeping, including during daytime naps. ? While taking prescription pain medicines, sleeping medicines, or medicines that make you drowsy.  Have a responsible adult stay with you for the time you are told. It is important to have someone help care for you until you are awake and alert.  Return to your normal activities as told by your health care provider. Ask your health care provider what activities are safe for you.  Take over-the-counter and prescription medicines only as told by your health care provider.  If you smoke, do not smoke without supervision.  Keep all follow-up visits as told by your health care provider. This is important. Contact a health care provider if:  You have nausea or vomiting that does not get better with medicine.  You cannot eat or drink without vomiting.  You have pain that does not get better with medicine.  You are unable to pass urine.  You develop a skin rash.  You have a fever.  You have redness around your IV site that gets worse. Get help right away if:  You have difficulty breathing.  You have chest pain.  You have blood in your urine or stool, or you vomit blood. Summary  After the procedure, it is common to have a sore  throat or nausea. It is also common to feel tired.  Have a responsible adult stay with you for the time you are told. It is important to have someone help care for you until you are awake and alert.  When you feel hungry, start by eating small amounts of foods that are soft and easy to digest (bland), such as toast. Gradually return to your regular diet.  Drink enough fluid to keep your urine pale yellow.  Return to your normal activities as told by your health care provider. Ask your health care provider what activities are safe for you. This information is not intended to replace advice given to you by your health care provider. Make sure you discuss any questions you have with your health care provider. Document Revised: 03/28/2020 Document Reviewed: 10/26/2019 Elsevier Patient Education  2021 [employer] Inc.  

## 2020-08-06 DIAGNOSIS — J3081 Allergic rhinitis due to animal (cat) (dog) hair and dander: Secondary | ICD-10-CM | POA: Diagnosis not present

## 2020-08-06 DIAGNOSIS — J3089 Other allergic rhinitis: Secondary | ICD-10-CM | POA: Diagnosis not present

## 2020-08-06 DIAGNOSIS — J301 Allergic rhinitis due to pollen: Secondary | ICD-10-CM | POA: Diagnosis not present

## 2020-08-07 ENCOUNTER — Other Ambulatory Visit
Admission: RE | Admit: 2020-08-07 | Discharge: 2020-08-07 | Disposition: A | Discharge status: Home / Self Care | Payer: Medicare Other | Source: Ambulatory Visit | Attending: Ophthalmology | Admitting: Ophthalmology

## 2020-08-07 ENCOUNTER — Other Ambulatory Visit: Payer: Self-pay

## 2020-08-07 DIAGNOSIS — Z01812 Encounter for preprocedural laboratory examination: Secondary | ICD-10-CM | POA: Insufficient documentation

## 2020-08-07 DIAGNOSIS — Z20822 Contact with and (suspected) exposure to covid-19: Secondary | ICD-10-CM | POA: Insufficient documentation

## 2020-08-07 LAB — SARS CORONAVIRUS 2 (TAT 6-24 HRS): SARS Coronavirus 2: NEGATIVE

## 2020-08-08 NOTE — Anesthesia Preprocedure Evaluation (Addendum)
Anesthesia Evaluation  Patient identified by MRN, date of birth, ID band Patient awake    Reviewed: NPO status   History of Anesthesia Complications (+) history of anesthetic complications (told labored breathing under anesthesia)  Airway Mallampati: II  TM Distance: >3 FB Neck ROM: full    Dental no notable dental hx.    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  Reproductive/Obstetrics                            Anesthesia Physical Anesthesia Plan  ASA: II  Anesthesia Plan: MAC   Post-op Pain Management:    Induction:   PONV Risk Score and Plan: 2 and TIVA and Midazolam  Airway Management Planned:   Additional Equipment:   Intra-op Plan:   Post-operative Plan:   Informed Consent: I have reviewed the patients History and Physical, chart, labs and discussed the procedure including the risks, benefits and alternatives for the proposed anesthesia with the patient or authorized representative who has indicated his/her understanding and acceptance.       Plan Discussed with: CRNA  Anesthesia Plan Comments:         Anesthesia Quick Evaluation

## 2020-08-09 ENCOUNTER — Encounter
Admission: RE | Disposition: A | Discharge status: Home / Self Care | Payer: Self-pay | Source: Home / Self Care | Attending: Ophthalmology

## 2020-08-09 ENCOUNTER — Ambulatory Visit: Payer: Medicare Other | Admitting: Anesthesiology

## 2020-08-09 ENCOUNTER — Encounter: Payer: Self-pay | Admitting: Ophthalmology

## 2020-08-09 ENCOUNTER — Other Ambulatory Visit: Payer: Self-pay

## 2020-08-09 ENCOUNTER — Ambulatory Visit
Admission: RE | Admit: 2020-08-09 | Discharge: 2020-08-09 | Disposition: A | Discharge status: Home / Self Care | Payer: Medicare Other | Attending: Ophthalmology | Admitting: Ophthalmology

## 2020-08-09 DIAGNOSIS — Z79899 Other long term (current) drug therapy: Secondary | ICD-10-CM | POA: Insufficient documentation

## 2020-08-09 DIAGNOSIS — Z91011 Allergy to milk products: Secondary | ICD-10-CM | POA: Insufficient documentation

## 2020-08-09 DIAGNOSIS — Z880 Allergy status to penicillin: Secondary | ICD-10-CM | POA: Insufficient documentation

## 2020-08-09 DIAGNOSIS — Z91041 Radiographic dye allergy status: Secondary | ICD-10-CM | POA: Diagnosis not present

## 2020-08-09 DIAGNOSIS — Z96659 Presence of unspecified artificial knee joint: Secondary | ICD-10-CM | POA: Diagnosis not present

## 2020-08-09 DIAGNOSIS — H02834 Dermatochalasis of left upper eyelid: Secondary | ICD-10-CM | POA: Insufficient documentation

## 2020-08-09 DIAGNOSIS — H02831 Dermatochalasis of right upper eyelid: Secondary | ICD-10-CM | POA: Insufficient documentation

## 2020-08-09 DIAGNOSIS — Z87891 Personal history of nicotine dependence: Secondary | ICD-10-CM | POA: Insufficient documentation

## 2020-08-09 DIAGNOSIS — Z888 Allergy status to other drugs, medicaments and biological substances status: Secondary | ICD-10-CM | POA: Diagnosis not present

## 2020-08-09 DIAGNOSIS — Z91018 Allergy to other foods: Secondary | ICD-10-CM | POA: Insufficient documentation

## 2020-08-09 DIAGNOSIS — Z79890 Hormone replacement therapy (postmenopausal): Secondary | ICD-10-CM | POA: Diagnosis not present

## 2020-08-09 DIAGNOSIS — Z882 Allergy status to sulfonamides status: Secondary | ICD-10-CM | POA: Diagnosis not present

## 2020-08-09 DIAGNOSIS — Z7982 Long term (current) use of aspirin: Secondary | ICD-10-CM | POA: Insufficient documentation

## 2020-08-09 DIAGNOSIS — Z7951 Long term (current) use of inhaled steroids: Secondary | ICD-10-CM | POA: Insufficient documentation

## 2020-08-09 HISTORY — PX: BROW LIFT: SHX178

## 2020-08-09 LAB — GLUCOSE, CAPILLARY
Glucose-Capillary: 124 mg/dL — ABNORMAL HIGH (ref 70–99)
Glucose-Capillary: 121 mg/dL — ABNORMAL HIGH (ref 70–99)

## 2020-08-09 SURGERY — BLEPHAROPLASTY
Anesthesia: Monitor Anesthesia Care | Site: Eye | Laterality: Bilateral

## 2020-08-09 MED ORDER — LACTATED RINGERS IV SOLN: INTRAVENOUS | Status: DC

## 2020-08-09 MED ORDER — BACITRACIN 500 UNIT/GM OP OINT
TOPICAL_OINTMENT | OPHTHALMIC | Status: DC | PRN
  Administered 2020-08-09: 1 via OPHTHALMIC

## 2020-08-09 MED ORDER — OXYCODONE HCL 5 MG PO TABS: 5.0000 mg | ORAL_TABLET | Freq: Once | ORAL | Status: DC | PRN

## 2020-08-09 MED ORDER — ERYTHROMYCIN 5 MG/GM OP OINT: TOPICAL_OINTMENT | OPHTHALMIC | 2 refills | Status: DC

## 2020-08-09 MED ORDER — LIDOCAINE-EPINEPHRINE 2 %-1:100000 IJ SOLN
INTRAMUSCULAR | Status: DC | PRN
  Administered 2020-08-09: 3 mL via OPHTHALMIC

## 2020-08-09 MED ORDER — BSS IO SOLN
INTRAOCULAR | Status: DC | PRN
  Administered 2020-08-09: 15 mL via INTRAOCULAR

## 2020-08-09 MED ORDER — ONDANSETRON HCL 4 MG/2ML IJ SOLN
INTRAMUSCULAR | Status: DC | PRN
  Administered 2020-08-09: 4 mg via INTRAVENOUS

## 2020-08-09 MED ORDER — ALFENTANIL 500 MCG/ML IJ INJ
INJECTION | INTRAVENOUS | Status: DC | PRN
  Administered 2020-08-09: 400 ug via INTRAVENOUS
  Administered 2020-08-09: 200 ug via INTRAVENOUS

## 2020-08-09 MED ORDER — PROPOFOL 500 MG/50ML IV EMUL
INTRAVENOUS | Status: DC | PRN
  Administered 2020-08-09: 25 ug/kg/min via INTRAVENOUS

## 2020-08-09 MED ORDER — OXYCODONE HCL 5 MG/5ML PO SOLN: 5.0000 mg | Freq: Once | ORAL | Status: DC | PRN

## 2020-08-09 MED ORDER — TRAMADOL HCL 50 MG PO TABS: ORAL_TABLET | ORAL | 0 refills | Status: DC

## 2020-08-09 MED ORDER — MIDAZOLAM HCL 2 MG/2ML IJ SOLN
INTRAMUSCULAR | Status: DC | PRN
  Administered 2020-08-09: 1 mg via INTRAVENOUS

## 2020-08-09 SURGICAL SUPPLY — 36 items
APPLICATOR COTTON TIP WD 3 STR (MISCELLANEOUS) ×2 IMPLANT
BLADE SURG 15 STRL LF DISP TIS (BLADE) ×1 IMPLANT
BLADE SURG 15 STRL SS (BLADE) ×1
CORD BIP STRL DISP 12FT (MISCELLANEOUS) ×2 IMPLANT
DRAPE HEAD BAR (DRAPES) ×2 IMPLANT
GAUZE SPONGE 4X4 12PLY STRL (GAUZE/BANDAGES/DRESSINGS) ×2 IMPLANT
GLOVE SURG LX 7.0 MICRO (GLOVE) ×2
GLOVE SURG LX STRL 7.0 MICRO (GLOVE) ×2 IMPLANT
GOWN STRL REUS W/ TWL LRG LVL3 (GOWN DISPOSABLE) ×1 IMPLANT
GOWN STRL REUS W/TWL LRG LVL3 (GOWN DISPOSABLE) ×1
MARKER SKIN XFINE TIP W/RULER (MISCELLANEOUS) ×2 IMPLANT
NEEDLE FILTER BLUNT 18X 1/2SAF (NEEDLE) ×1
NEEDLE FILTER BLUNT 18X1 1/2 (NEEDLE) ×1 IMPLANT
NEEDLE HYPO 30X.5 LL (NEEDLE) ×4 IMPLANT
PACK ENT CUSTOM (PACKS) ×2 IMPLANT
SOL PREP PVP 2OZ (MISCELLANEOUS) ×2
SOLUTION PREP PVP 2OZ (MISCELLANEOUS) ×1 IMPLANT
SPONGE GAUZE 2X2 8PLY STRL LF (GAUZE/BANDAGES/DRESSINGS) ×20 IMPLANT
SUT CHROMIC 4-0 (SUTURE)
SUT CHROMIC 4-0 M2 12X2 ARM (SUTURE)
SUT CHROMIC 5 0 P 3 (SUTURE) IMPLANT
SUT ETHILON 4 0 CL P 3 (SUTURE) IMPLANT
SUT GUT PLAIN 6-0 1X18 ABS (SUTURE) ×2 IMPLANT
SUT MERSILENE 4-0 S-2 (SUTURE) IMPLANT
SUT PROLENE 5 0 P 3 (SUTURE) IMPLANT
SUT PROLENE 6 0 P 1 18 (SUTURE) IMPLANT
SUT SILK 4 0 G 3 (SUTURE) IMPLANT
SUT VIC AB 5-0 P-3 18X BRD (SUTURE) IMPLANT
SUT VIC AB 5-0 P3 18 (SUTURE)
SUT VICRYL 6-0  S14 CTD (SUTURE)
SUT VICRYL 6-0 S14 CTD (SUTURE) IMPLANT
SUT VICRYL 7 0 TG140 8 (SUTURE) IMPLANT
SUTURE CHRMC 4-0 M2 12X2 ARM (SUTURE) IMPLANT
SYR 10ML LL (SYRINGE) ×2 IMPLANT
SYR 3ML LL SCALE MARK (SYRINGE) ×2 IMPLANT
WATER STERILE IRR 250ML POUR (IV SOLUTION) ×2 IMPLANT

## 2020-08-09 NOTE — Op Note (Signed)
Preoperative Diagnosis:  Visually significant dermatochalasis bilateral  Upper Eyelid(s)  Postoperative Diagnosis:  Same.  Procedure(s) Performed:   Upper eyelid blepharoplasty with excess skin excision  bilateral  Upper Eyelid(s)  Surgeon: Philis Pique. Vickki Muff, M.D.  Assistants: none  Anesthesia: MAC  Specimens: None.  Estimated Blood Loss: Minimal.  Complications: None.  Operative Findings: None Dictated  Procedure:   Allergies were reviewed and the patient is allergic to Lisinopril, Iodinated diagnostic agents, Cinnamon, Ertugliflozin, Omeprazole, Peanut oil, Penicillins, Sulfa antibiotics, Verapamil hcl er, Amitriptyline, Januvia [sitagliptin], Milk protein, Pioglitazone, and Sglt2 inhibitors.   After the risks, benefits, complications and alternatives were discussed with the patient, appropriate informed consent was obtained and the patient was brought to the operating suite. The patient was reclined supine and a timeout was conducted.  The patient was then sedated.  Local anesthetic consisting of a 50-50 mixture of 2% lidocaine with epinephrine and 0.75% bupivacaine with added Hylenex was injected subcutaneously to both  upper eyelid(s). After adequate local was instilled, the patient was prepped and draped in the usual sterile fashion for eyelid surgery.   Attention was turned to the upper eyelids. A 65m upper eyelid crease incision line was marked with calipers on both  upper eyelid(s).  A pinch test was used to estimate the amount of excess skin to remove and this was marked in standard blepharoplasty style fashion. Attention was turned to the  right  upper eyelid. A #15 blade was used to open the premarked incision line. A Skin and muscle flap was excised and hemostasis was obtained with bipolar cautery.   A buttonhole was created medially in orbicularis and orbital septum to reveal the medial fat pocket. This was dissected free from fascial attachments, cauterized towards the  pedicle base and excised to produce a nice flattening of the medial corner of the upper eyelid.  Attention was then turned to the opposite eyelid where the same procedure was performed in the same manner. Hemostasis was obtained with bipolar cautery throughout. All incisions were then closed with a combination of running and interrupted 6-0 fast absorbing plain suture. The patient tolerated the procedure well.  Erythromycin ophthalmic ointment was applied to her incision sites, followed by ice packs. She was taken to the recovery area where she recovered without difficulty.  Post-Op Plan/Instructions:  The patient was instructed to use ice packs frequently for the next 48 hours. She was instructed to use Erythromycin ophthalmic ointment on her incisions 4 times a day for the next 12 to 14 days. She was given a prescription for tramadol (or similar) for pain control should Tylenol not be effective. She was [facility] as needed [place] problems.  Tavia Stave M. FVickki Muff M.D. Ophthalmology

## 2020-08-09 NOTE — Anesthesia Postprocedure Evaluation (Signed)
Anesthesia Post Note  Patient: Sharon Nelson  Procedure(s) Performed: BLEPHAROPLASTY UPPER EYELID; W/EXCESS SKIN BILATERAL DIABETIC (Bilateral Eye)     Patient location during evaluation: PACU Anesthesia Type: MAC Level of consciousness: awake and alert Pain management: pain level controlled Vital Signs Assessment: post-procedure vital signs reviewed and stable Respiratory status: spontaneous breathing, nonlabored ventilation, respiratory function stable and patient connected to nasal cannula oxygen Cardiovascular status: stable and blood pressure returned to baseline Postop Assessment: no apparent nausea or vomiting Anesthetic complications: no   No complications documented.  Fidel Levy

## 2020-08-09 NOTE — Interval H&P Note (Signed)
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  The various methods of treatment have been discussed with the patient and family. After consideration of risks, benefits and other options for treatment, the patient has consented to  Procedure(s) with comments: BLEPHAROPLASTY UPPER EYELID; W/EXCESS SKIN BILATERAL DIABETIC (Bilateral) - Diabetic - injectable as a surgical intervention.  The patient's history has been reviewed, patient examined, no change in status, stable for surgery.  I have reviewed the patient's chart and labs.  Questions were answered to the patient's satisfaction.     Vickki Muff, Amy M

## 2020-08-09 NOTE — Transfer of Care (Signed)
Immediate Anesthesia Transfer of Care Note  Patient: Sharon Nelson  Procedure(s) Performed: BLEPHAROPLASTY UPPER EYELID; W/EXCESS SKIN BILATERAL DIABETIC (Bilateral Eye)  Patient Location: PACU  Anesthesia Type: MAC  Level of Consciousness: awake, alert  and patient cooperative  Airway and Oxygen Therapy: Patient Spontanous Breathing and Patient connected to supplemental oxygen  Post-op Assessment: Post-op Vital signs reviewed, Patient's Cardiovascular Status Stable, Respiratory Function Stable, Patent Airway and No signs of Nausea or vomiting  Post-op Vital Signs: Reviewed and stable  Complications: No complications documented.

## 2020-08-09 NOTE — H&P (Signed)
  See the history and physical completed at [facility] on 07/30/2020 and scanned into the chart.

## 2020-08-09 NOTE — Anesthesia Procedure Notes (Signed)
Procedure Name: General with mask airway Date/Time: 08/09/2020 7:45 AM Performed by: Dionne Bucy, CRNA Pre-anesthesia Checklist: Patient identified, Emergency Drugs available, Suction available, Patient being monitored and Timeout performed Patient Re-evaluated:Patient Re-evaluated prior to induction Oxygen Delivery Method: Nasal cannula Induction Type: IV induction Placement Confirmation: positive ETCO2

## 2020-08-10 DIAGNOSIS — H02831 Dermatochalasis of right upper eyelid: Secondary | ICD-10-CM | POA: Diagnosis not present

## 2020-08-10 DIAGNOSIS — H02834 Dermatochalasis of left upper eyelid: Secondary | ICD-10-CM | POA: Diagnosis not present

## 2020-08-13 ENCOUNTER — Other Ambulatory Visit: Payer: Self-pay | Admitting: Family Medicine

## 2020-08-13 NOTE — Telephone Encounter (Signed)
Requested medication (s) are due for refill today: yes  Requested medication (s) are on the active medication list: yes  Last refill:  07/18/20 #30 0 refill  Future visit scheduled: yes in 1 week   Notes to clinic:  do you want a 2nd courtesy refill ?     Requested Prescriptions  Pending Prescriptions Disp Refills   spironolactone (ALDACTONE) 100 MG tablet [Pharmacy Med Name: SPIRONOLACTONE 100MG  TABLETS] 30 tablet 0    Sig: TAKE 1 TABLET BY MOUTH EVERY DAY FOR BLOOD PRESSURE OR SWELLING      Cardiovascular: Diuretics - Aldosterone Antagonist Failed - 08/13/2020  4:26 PM      Failed - Cr in normal range and within 360 days    Creatinine  Date Value Ref Range Status  02/12/2014 1.04 0.60 - 1.30 mg/dL Final   Creatinine, Ser  Date Value Ref Range Status  08/17/2019 0.94 0.44 - 1.00 mg/dL Final   Creatinine, POC  Date Value Ref Range Status  05/15/2019 n/a mg/dL Final          Failed - K in normal range and within 360 days    Potassium  Date Value Ref Range Status  08/17/2019 3.7 3.5 - 5.1 mmol/L Final  02/12/2014 4.4 3.5 - 5.1 mmol/L Final          Failed - Na in normal range and within 360 days    Sodium  Date Value Ref Range Status  08/17/2019 135 135 - 145 mmol/L Final  09/08/2018 139 134 - 144 mmol/L Final  02/12/2014 142 136 - 145 mmol/L Final          Failed - Valid encounter within last 6 months    Recent Outpatient Visits           8 months ago Type 2 diabetes mellitus with other specified complication, without long-term current use of insulin [facility])   [facility] Birdie Sons, MD   8 months ago Eczema, unspecified type   [facility], [place], PA-C   12 months ago Shortness of breath   [facility], Kelby Aline, FNP   1 year ago Other postherpetic nervous system involvement   [facility] Birdie Sons, MD   1 year ago Type 2 diabetes mellitus with other specified  complication, without long-term current use of insulin [facility])   [facility] Birdie Sons, MD       Future Appointments             In 1 week Fisher, Kirstie Peri, MD [facility]             Passed - Last BP in normal range    BP Readings from Last 1 Encounters:  08/09/20 136/68

## 2020-08-15 ENCOUNTER — Other Ambulatory Visit: Payer: Self-pay | Admitting: Family Medicine

## 2020-08-15 DIAGNOSIS — J301 Allergic rhinitis due to pollen: Secondary | ICD-10-CM | POA: Diagnosis not present

## 2020-08-15 DIAGNOSIS — R413 Other amnesia: Secondary | ICD-10-CM

## 2020-08-15 DIAGNOSIS — J3089 Other allergic rhinitis: Secondary | ICD-10-CM | POA: Diagnosis not present

## 2020-08-20 ENCOUNTER — Ambulatory Visit: Payer: Medicare Other | Admitting: Family Medicine

## 2020-08-20 DIAGNOSIS — J3089 Other allergic rhinitis: Secondary | ICD-10-CM | POA: Diagnosis not present

## 2020-08-20 DIAGNOSIS — J3081 Allergic rhinitis due to animal (cat) (dog) hair and dander: Secondary | ICD-10-CM | POA: Diagnosis not present

## 2020-08-20 DIAGNOSIS — J301 Allergic rhinitis due to pollen: Secondary | ICD-10-CM | POA: Diagnosis not present

## 2020-08-20 NOTE — Progress Notes (Deleted)
Established patient visit   Patient: Sharon Nelson   DOB: Oct 25, 1950   70 y.o. Female  MRN: [id number] Visit Date: 08/20/2020  Today's healthcare provider: [name], MD   No chief complaint on file.  Subjective    HPI  Hypertension, follow-up  BP Readings from Last 3 Encounters:  08/09/20 136/68  12/12/19 (!) 150/70  11/22/19 138/75   Wt Readings from Last 3 Encounters:  08/09/20 160 lb (72.6 kg)  12/12/19 159 lb (72.1 kg)  11/22/19 155 lb (70.3 kg)     She was last seen for hypertension 8 months ago.  BP at that visit was 150/70. Management since that visit includes continue same medications.  She reports {excellent/good/fair/poor:19665} compliance with treatment. She {is/is not:9024} having side effects. {document side effects if present:1} She is following a {diet:21022986} diet. She {is/is not:9024} exercising. She {does/does not:200015} smoke.  Use of agents associated with hypertension: NSAIDS.   Outside blood pressures are {***enter patient reported home BP readings, or 'not being checked':1}. Symptoms: {Yes/No:20286} chest pain {Yes/No:20286} chest pressure  {Yes/No:20286} palpitations {Yes/No:20286} syncope  {Yes/No:20286} dyspnea {Yes/No:20286} orthopnea  {Yes/No:20286} paroxysmal nocturnal dyspnea {Yes/No:20286} lower extremity edema   Pertinent labs: Lab Results  Component Value Date   CHOL 164 12/12/2019   HDL 36 (L) 12/12/2019   LDLCALC 84 12/12/2019   TRIG 264 (H) 12/12/2019   CHOLHDL 4.6 (H) 12/12/2019   Lab Results  Component Value Date   NA 135 08/17/2019   K 3.7 08/17/2019   CREATININE 0.94 08/17/2019   GFRNONAA >60 08/17/2019   GFRAA >60 08/17/2019   GLUCOSE 144 (H) 08/17/2019     The 10-year ASCVD risk score Mikey Bussing DC Jr., et al., 2013) is: 24.5%   ---------------------------------------------------------------------------------------------------  Follow up for memory difficulties:  The patient was last seen for this 8  months ago. Changes made at last visit include increasing Donepezil from 38m daily to 133mdaily.  She reports {excellent/good/fair/poor:19665} compliance with treatment. She feels that condition is {improved/worse/unchanged:3041574}. She {is/is not:21021397} having side effects. ***  -----------------------------------------------------------------------------------------   {Show patient history (optional):23778::" "}   Medications: Outpatient Medications Prior to Visit  Medication Sig  . ACCU-CHEK AVIVA PLUS test strip USE TO TEST ONCE DAILY AS DIRECTED  . ADVAIR HFA 115-21 MCG/ACT inhaler Inhale 2 puffs into the lungs 2 (two) times daily.   . Marland Kitchenlbuterol (VENTOLIN HFA) 108 (90 Base) MCG/ACT inhaler Inhale into the lungs every 4 (four) hours as needed.   . AMITIZA 24 MCG capsule TAKE 1 CAPSULE BY MOUTH TWICE DAILY (Patient taking differently: Taking as needed)  . aspirin 81 MG tablet Take 81 mg by mouth daily.   . Marland Kitchenzelastine (OPTIVAR) 0.05 % ophthalmic solution 1 drop 2 (two) times daily.  . Marland KitchenIOTIN PO Take by mouth daily. When remembers  . Blood Glucose Monitoring Suppl (ACCU-CHEK AVIVA PLUS) w/Device KIT Use to check blood sugar for type 2 diabetes.  . Cholecalciferol 2000 UNITS CAPS Take by mouth daily.   . Cyanocobalamin (VITAMIN B 12 PO) Take by mouth daily. When remembers (Patient not taking: Reported on 07/31/2020)  . donepezil (ARICEPT) 10 MG tablet TAKE 1 TABLET(10 MG) BY MOUTH DAILY  . EPINEPHrine 0.3 mg/0.3 mL IJ SOAJ injection   . erythromycin ophthalmic ointment Apply to sutures 4 times a day for 10-12 days.  Discontinue if allergy develops and call our office  . estradiol (ESTRACE) 0.5 MG tablet TAKE 1 TABLET BY MOUTH EVERY DAY  . furosemide (LASIX) 20 MG  tablet TAKE 1 TABLET(20 MG) BY MOUTH DAILY AS NEEDED FOR SWELLING  . gabapentin (NEURONTIN) 600 MG tablet TAKE 1 TABLET BY MOUTH THREE TIMES DAILY  . glipiZIDE (GLUCOTROL) 5 MG tablet Take 1 tablet (5 mg total) by mouth  daily before breakfast. (Patient not taking: Reported on 07/31/2020)  . HYDROcodone-acetaminophen (NORCO/VICODIN) 5-325 MG tablet Take 1-2 tablets by mouth every 6 (six) hours as needed for moderate pain or severe pain. (Patient not taking: No sig reported)  . levocetirizine (XYZAL) 5 MG tablet Take 5 mg by mouth every evening.   . 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 REMOVE AND DISCARD AFTER 12 HOURS OR AS DIRECTED BY DOCTOR  . lovastatin (MEVACOR) 40 MG tablet Take 1 tablet by mouth at bedtime  . mometasone (ELOCON) 0.1 % cream APP EXT AA BID  . Semaglutide (OZEMPIC, 1 MG/DOSE, [place]) Inject into the skin once a week. Mondays  . spironolactone (ALDACTONE) 100 MG tablet Take 1 tablet (100 mg total) by mouth daily.  . traMADol (ULTRAM) 50 MG tablet Take 1 every 4-6 hours as needed for pain not controlled by Tylenol  . triamcinolone cream (KENALOG) 0.1 % Apply 1 application topically 2 (two) times daily.   No facility-administered medications prior to visit.    Review of Systems  {Labs  Heme  Chem  Endocrine  Serology  Results Review (optional):23779::" "}   Objective    There were no vitals taken for this visit. {Show previous vital signs (optional):23777::" "}   Physical Exam  ***  No results found for any visits on 08/20/20.  Assessment & Plan     ***  No follow-ups on file.      {provider attestation***:1}   [name], MD  [facility] [phone] (phone) [phone] (fax)  [place]

## 2020-08-27 DIAGNOSIS — J3081 Allergic rhinitis due to animal (cat) (dog) hair and dander: Secondary | ICD-10-CM | POA: Diagnosis not present

## 2020-08-27 DIAGNOSIS — J301 Allergic rhinitis due to pollen: Secondary | ICD-10-CM | POA: Diagnosis not present

## 2020-08-27 DIAGNOSIS — J3089 Other allergic rhinitis: Secondary | ICD-10-CM | POA: Diagnosis not present
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
## 2020-09-03 ENCOUNTER — Other Ambulatory Visit: Payer: Self-pay

## 2020-09-03 ENCOUNTER — Ambulatory Visit (INDEPENDENT_AMBULATORY_CARE_PROVIDER_SITE_OTHER): Payer: Medicare Other

## 2020-09-03 DIAGNOSIS — J3089 Other allergic rhinitis: Secondary | ICD-10-CM | POA: Diagnosis not present

## 2020-09-03 DIAGNOSIS — J301 Allergic rhinitis due to pollen: Secondary | ICD-10-CM | POA: Diagnosis not present

## 2020-09-03 DIAGNOSIS — Z0000 Encounter for general adult medical examination without abnormal findings: Secondary | ICD-10-CM | POA: Diagnosis not present

## 2020-09-03 DIAGNOSIS — J3081 Allergic rhinitis due to animal (cat) (dog) hair and dander: Secondary | ICD-10-CM | POA: Diagnosis not present

## 2020-09-03 NOTE — Patient Instructions (Signed)
[name] , Thank you for taking time to come for your Medicare Wellness Visit. I appreciate your ongoing commitment to your health goals. Please review the following plan we discussed and let me know if I can assist you in the future.   Screening recommendations/referrals: Colonoscopy: Up to date, due 04/2023 Mammogram: Up to date, due 10/2020 Bone Density: Up to date, due 09/2021 Recommended yearly ophthalmology/optometry visit for glaucoma screening and checkup Recommended yearly dental visit for hygiene and checkup  Vaccinations: Influenza vaccine: Done 06/13/20 Pneumococcal vaccine: Completed series Tdap vaccine: Up to date, due 04/2021 Shingles vaccine: Shingrix discussed. Please contact your pharmacy for coverage information.     Advanced directives: Please bring a copy of your POA (Power of Attorney) and/or Living Will to your next appointment.   Conditions/risks identified: Recommend to increase water intake to 6-8 8 oz glasses a day. Also recommend to start back exercising 3 days a week for at least 30 minutes at a time.   Next appointment: 10/30/20 @ 9:00 AM with Dr Caryn Section   Preventive Care 31 Years and Older, Female Preventive care refers to lifestyle choices and visits with your health care provider that can promote health and wellness. What does preventive care include?  A yearly physical exam. This is also called an annual well check.  Dental exams once or twice a year.  Routine eye exams. Ask your health care provider how often you should have your eyes checked.  Personal lifestyle choices, including:  Daily care of your teeth and gums.  Regular physical activity.  Eating a healthy diet.  Avoiding tobacco and drug use.  Limiting alcohol use.  Practicing safe sex.  Taking low-dose aspirin every day.  Taking vitamin and mineral supplements as recommended by your health care provider. What happens during an annual well check? The services and screenings done  by your health care provider during your annual well check will depend on your age, overall health, lifestyle risk factors, and family history of disease. Counseling  Your health care provider may ask you questions about your:  Alcohol use.  Tobacco use.  Drug use.  Emotional well-being.  Home and relationship well-being.  Sexual activity.  Eating habits.  History of falls.  Memory and ability to understand (cognition).  Work and work Statistician.  Reproductive health. Screening  You may have the following tests or measurements:  Height, weight, and BMI.  Blood pressure.  Lipid and cholesterol levels. These may be checked every 5 years, or more frequently if you are over 87 years old.  Skin check.  Lung cancer screening. You may have this screening every year starting at age 67 if you have a 30-pack-year history of smoking and currently smoke or have quit within the past 15 years.  Fecal occult blood test (FOBT) of the stool. You may have this test every year starting at age 41.  Flexible sigmoidoscopy or colonoscopy. You may have a sigmoidoscopy every 5 years or a colonoscopy every 10 years starting at age 44.  Hepatitis C blood test.  Hepatitis B blood test.  Sexually transmitted disease (STD) testing.  Diabetes screening. This is done by checking your blood sugar (glucose) after you have not eaten for a while (fasting). You may have this done every 1-3 years.  Bone density scan. This is done to screen for osteoporosis. You may have this done starting at age 41.  Mammogram. This may be done every 1-2 years. Talk to your health care provider about how often  you should have regular mammograms. Talk with your health care provider about your test results, treatment options, and if necessary, the need for more tests. Vaccines  Your health care provider may recommend certain vaccines, such as:  Influenza vaccine. This is recommended every year.  Tetanus,  diphtheria, and acellular pertussis (Tdap, Td) vaccine. You may need a Td booster every 10 years.  Zoster vaccine. You may need this after age 56.  Pneumococcal 13-valent conjugate (PCV13) vaccine. One dose is recommended after age 26.  Pneumococcal polysaccharide (PPSV23) vaccine. One dose is recommended after age 47. Talk to your health care provider about which screenings and vaccines you need and how often you need them. This information is not intended to replace advice given to you by your health care provider. Make sure you discuss any questions you have with your health care provider. Document Released: 08/09/2015 Document Revised: 04/01/2016 Document Reviewed: 05/14/2015 Elsevier Interactive Patient Education  2017 [place] Prevention in the Home Falls can cause injuries. They can happen to people of all ages. There are many things you can do to make your home safe and to help prevent falls. What can I do on the outside of my home?  Regularly fix the edges of walkways and driveways and fix any cracks.  Remove anything that might make you trip as you walk through a door, such as a raised step or threshold.  Trim any bushes or trees on the path to your home.  Use bright outdoor lighting.  Clear any walking paths of anything that might make someone trip, such as rocks or tools.  Regularly check to see if handrails are loose or broken. Make sure that both sides of any steps have handrails.  Any raised decks and porches should have guardrails on the edges.  Have any leaves, snow, or ice cleared regularly.  Use sand or salt on walking paths during winter.  Clean up any spills in your garage right away. This includes oil or grease spills. What can I do in the bathroom?  Use night lights.  Install grab bars by the toilet and in the tub and shower. Do not use towel bars as grab bars.  Use non-skid mats or decals in the tub or shower.  If you need to sit down in  the shower, use a plastic, non-slip stool.  Keep the floor dry. Clean up any water that spills on the floor as soon as it happens.  Remove soap buildup in the tub or shower regularly.  Attach bath mats securely with double-sided non-slip rug tape.  Do not have throw rugs and other things on the floor that can make you trip. What can I do in the bedroom?  Use night lights.  Make sure that you have a light by your bed that is easy to reach.  Do not use any sheets or blankets that are too big for your bed. They should not hang down onto the floor.  Have a firm chair that has side arms. You can use this for support while you get dressed.  Do not have throw rugs and other things on the floor that can make you trip. What can I do in the kitchen?  Clean up any spills right away.  Avoid walking on wet floors.  Keep items that you use a lot in easy-to-reach places.  If you need to reach something above you, use a strong step stool that has a grab bar.  Keep electrical cords  out of the way.  Do not use floor polish or wax that makes floors slippery. If you must use wax, use non-skid floor wax.  Do not have throw rugs and other things on the floor that can make you trip. What can I do with my stairs?  Do not leave any items on the stairs.  Make sure that there are handrails on both sides of the stairs and use them. Fix handrails that are broken or loose. Make sure that handrails are as long as the stairways.  Check any carpeting to make sure that it is firmly attached to the stairs. Fix any carpet that is loose or worn.  Avoid having throw rugs at the top or bottom of the stairs. If you do have throw rugs, attach them to the floor with carpet tape.  Make sure that you have a light switch at the top of the stairs and the bottom of the stairs. If you do not have them, ask someone to add them for you. What else can I do to help prevent falls?  Wear shoes that:  Do not have high  heels.  Have rubber bottoms.  Are comfortable and fit you well.  Are closed at the toe. Do not wear sandals.  If you use a stepladder:  Make sure that it is fully opened. Do not climb a closed stepladder.  Make sure that both sides of the stepladder are locked into place.  Ask someone to hold it for you, if possible.  Clearly mark and make sure that you can see:  Any grab bars or handrails.  First and last steps.  Where the edge of each step is.  Use tools that help you move around (mobility aids) if they are needed. These include:  Canes.  Walkers.  Scooters.  Crutches.  Turn on the lights when you go into a dark area. Replace any light bulbs as soon as they burn out.  Set up your furniture so you have a clear path. Avoid moving your furniture around.  If any of your floors are uneven, fix them.  If there are any pets around you, be aware of where they are.  Review your medicines with your doctor. Some medicines can make you feel dizzy. This can increase your chance of falling. Ask your doctor what other things that you can do to help prevent falls. This information is not intended to replace advice given to you by your health care provider. Make sure you discuss any questions you have with your health care provider. Document Released: 05/09/2009 Document Revised: 12/19/2015 Document Reviewed: 08/17/2014 Elsevier Interactive Patient Education  2017 [employer].

## 2020-09-05 DIAGNOSIS — J301 Allergic rhinitis due to pollen: Secondary | ICD-10-CM | POA: Diagnosis not present

## 2020-09-05 DIAGNOSIS — J3089 Other allergic rhinitis: Secondary | ICD-10-CM | POA: Diagnosis not present

## 2020-09-10 DIAGNOSIS — J301 Allergic rhinitis due to pollen: Secondary | ICD-10-CM | POA: Diagnosis not present

## 2020-09-10 DIAGNOSIS — J3081 Allergic rhinitis due to animal (cat) (dog) hair and dander: Secondary | ICD-10-CM | POA: Diagnosis not present

## 2020-09-10 DIAGNOSIS — J3089 Other allergic rhinitis: Secondary | ICD-10-CM | POA: Diagnosis not present

## 2020-09-13 ENCOUNTER — Telehealth: Payer: Self-pay
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
## 2020-09-14 ENCOUNTER — Other Ambulatory Visit: Payer: Self-pay | Admitting: Family Medicine

## 2020-09-14 DIAGNOSIS — R413 Other amnesia: Secondary | ICD-10-CM

## 2020-09-14 NOTE — Telephone Encounter (Signed)
Last OV 11/2019 indicated no follow ups on file, will refill x 90 days, next appointment 10/2020.

## 2020-09-17 DIAGNOSIS — J3081 Allergic rhinitis due to animal (cat) (dog) hair and dander: Secondary | ICD-10-CM | POA: Diagnosis not present

## 2020-09-17 DIAGNOSIS — J3089 Other allergic rhinitis: Secondary | ICD-10-CM | POA: Diagnosis not present

## 2020-09-17 DIAGNOSIS — J301 Allergic rhinitis due to pollen: Secondary | ICD-10-CM | POA: Diagnosis not present

## 2020-09-19 NOTE — Addendum Note (Signed)
Addended by: Daron Offer A on: 09/19/2020 03:54 PM   Modules accepted: Orders

## 2020-09-24 DIAGNOSIS — J301 Allergic rhinitis due to pollen: Secondary | ICD-10-CM | POA: Diagnosis not present

## 2020-09-24 DIAGNOSIS — J3089 Other allergic rhinitis: Secondary | ICD-10-CM | POA: Diagnosis not present

## 2020-09-26 ENCOUNTER — Telehealth: Payer: Self-pay

## 2020-09-26 NOTE — Progress Notes (Signed)
Left voice message to  confirmed patient telephone  appointment on 09/27/2020 for CCM at 9:00 am with Junius Argyle the Clinical pharmacist.   [place] Pharmacist Assistant [phone]

## 2020-09-27 ENCOUNTER — Ambulatory Visit (INDEPENDENT_AMBULATORY_CARE_PROVIDER_SITE_OTHER): Payer: Medicare Other

## 2020-09-27 ENCOUNTER — Telehealth: Payer: Self-pay

## 2020-09-27 DIAGNOSIS — E1169 Type 2 diabetes mellitus with other specified complication: Secondary | ICD-10-CM | POA: Diagnosis not present

## 2020-09-27 DIAGNOSIS — E785 Hyperlipidemia, unspecified: Secondary | ICD-10-CM

## 2020-09-27 DIAGNOSIS — I152 Hypertension secondary to endocrine disorders: Secondary | ICD-10-CM

## 2020-09-27 DIAGNOSIS — E1159 Type 2 diabetes mellitus with other circulatory complications: Secondary | ICD-10-CM | POA: Diagnosis not present

## 2020-09-27 NOTE — Chronic Care Management (AMB) (Signed)
Chronic Care Management Pharmacy Assistant   Name: KYANDRA MCCLAINE  MRN: [id number] DOB: 1951/05/11   Reason for Encounter: Patient Assistance Coordination   09/27/2020- Patient assistance forms started for Ozempic with [place] Patient Assistance Program.  10/01/2020- Called patient to inform about application, patient would like them mailed to. Patient aware I will highlight and tag all areas that she would need to fill out or sign and the required income documents needed. Patient aware to return forms to PCP office for Dr Caryn Section to sign and Cristie Hem to fax over. Patient also inquired about invite she was to receive for her CGM monitor. Patient aware CPP resent [employer] invite to her email address. Patient aware and will look for invite and will check under spam to make sure it didn't go there.  Junius Argyle, CPP notified.    Medications: Outpatient Encounter Medications as of 09/27/2020  Medication Sig Note   ACCU-CHEK AVIVA PLUS test strip USE TO TEST ONCE DAILY AS DIRECTED (Patient not taking: Reported on 09/03/2020)    ADVAIR HFA 115-21 MCG/ACT inhaler Inhale 2 puffs into the lungs 2 (two) times daily.     albuterol (VENTOLIN HFA) 108 (90 Base) MCG/ACT inhaler Inhale into the lungs every 4 (four) hours as needed.  01/25/2015: Received from: [place] 24 MCG capsule TAKE 1 CAPSULE BY MOUTH TWICE DAILY (Patient taking differently: Taking as needed)    aspirin 81 MG tablet Take 81 mg by mouth daily.  01/25/2015: Received from: [place]   azelastine (OPTIVAR) 0.05 % ophthalmic solution 1 drop 2 (two) times daily.    BIOTIN PO Take by mouth daily. When remembers    Blood Glucose Monitoring Suppl (ACCU-CHEK AVIVA PLUS) w/Device KIT Use to check blood sugar for type 2 diabetes. (Patient not taking: Reported on 09/03/2020)    Cholecalciferol 2000 UNITS CAPS Take by mouth daily.  01/25/2015: Received from: [place]    Continuous Blood Gluc Receiver (FREESTYLE LIBRE 2 READER) DEVI USE 1 DEVICE AS DIRECTED    Cyanocobalamin (VITAMIN B 12 PO) Take by mouth daily. When remembers (Patient not taking: No sig reported)    donepezil (ARICEPT) 10 MG tablet TAKE 1 TABLET(10 MG) BY MOUTH DAILY    EPINEPHrine 0.3 mg/0.3 mL IJ SOAJ injection  02/05/2016: PRN    erythromycin ophthalmic ointment Apply to sutures 4 times a day for 10-12 days.  Discontinue if allergy develops and call our office    estradiol (ESTRACE) 0.5 MG tablet TAKE 1 TABLET BY MOUTH EVERY DAY    furosemide (LASIX) 20 MG tablet TAKE 1 TABLET(20 MG) BY MOUTH DAILY AS NEEDED FOR SWELLING    gabapentin (NEURONTIN) 600 MG tablet TAKE 1 TABLET BY MOUTH THREE TIMES DAILY    levocetirizine (XYZAL) 5 MG tablet Take 5 mg by mouth every evening.  01/25/2015: Received from: [place]   lidocaine (LIDODERM) 5 % PLACE 1 PATCH ONTO SKIN EVERY DAY FOR POST HERPETIC NEURALGIA. REMOVE AND DISCARD AFTER 12 HOURS OR AS DIRECTED BY DOCTOR    lovastatin (MEVACOR) 40 MG tablet Take 1 tablet by mouth at bedtime 01/25/2015: Received from: [place]   mometasone (ELOCON) 0.1 % cream APP EXT AA BID (Patient not taking: Reported on 09/03/2020)    Semaglutide (OZEMPIC, 1 MG/DOSE, [place]) Inject 0.5 mg into the skin once a week. Mondays 09/19/2020: Prescribed by Dr. Honor Junes    spironolactone (ALDACTONE) 100 MG tablet Take 1  tablet (100 mg total) by mouth daily.    traMADol (ULTRAM) 50 MG tablet Take 1 every 4-6 hours as needed for pain not controlled by Tylenol    triamcinolone cream (KENALOG) 0.1 % Apply 1 application topically 2 (two) times daily. (Patient not taking: Reported on 09/03/2020)    No facility-administered encounter medications on file as of 09/27/2020.     Star Rating Drugs: Lovastatin 40 mg- Last filled 04/09/20 for 90 day supply at [facility].  SIG: Pattricia Boss, [place] Pharmacist Assistant [phone]

## 2020-09-27 NOTE — Patient Instructions (Signed)
Visit Information It was great speaking with you today!  Please let me know if you have any questions about our visit.  Goals Addressed            This Visit's Progress   . Monitor and Manage My Blood Sugar-Diabetes Type 2       Timeframe:  Long-Range Goal Priority:  High Start Date:  09/27/2020                            Expected End Date:  03/28/2021                     Follow Up Date 12/23/2020     -check blood sugar at least 3 times daily - check blood sugar before and after exercise - check blood sugar if I feel it is too high or too low - take the blood sugar meter to all doctor visits    Why is this important?    Checking your blood sugar at home helps to keep it from getting very high or very low.   Writing the results in a diary or log helps the doctor know how to care for you.   Your blood sugar log should have the time, date and the results.   Also, write down the amount of insulin or other medicine that you take.   Other information, like what you ate, exercise done and how you were feeling, will also be helpful.     Notes:        Patient Care Plan: General Pharmacy (Adult)    Patient Care Plan: General Pharmacy (Adult)    Problem Identified: Hypertension, Hyperlipidemia, Diabetes and Osteopenia   Priority: High    Long-Range Goal: Patient-Specific Goal   Start Date: 09/27/2020  Expected End Date: 03/30/2021  This Visit's Progress: On track  Priority: High  Note:   Current Barriers:  . Unable to independently afford treatment regimen  Pharmacist Clinical Goal(s):  Marland Kitchen Over the next 90 days, patient will verbalize ability to afford treatment regimen . maintain control of diabetes as evidenced by A1c less than 8%  through collaboration with PharmD and provider.   Interventions: . 1:1 collaboration with Birdie Sons, MD regarding development and update of comprehensive plan of care as evidenced by provider attestation and  co-signature . Inter-disciplinary care team collaboration (see longitudinal plan of care) . Comprehensive medication review performed; medication list updated in electronic medical record  Hypertension (BP goal <140/90) -Controlled -Current treatment: . Furosemide 20 mg daily as needed . Spironolactone 100 mg daily  -Medications previously tried: lisinopril   -Current home readings: NA -Denies hypotensive/hypertensive symptoms -Patient reports worsening lower extremity edema.  -Educated on Daily salt intake goal < 2300 mg; -Counseled to monitor BP at home weekly, document, and provide log at future appointments -Recommended to continue current medication  Hyperlipidemia: (LDL goal < 70) -Not ideally controlled -Current treatment: . Lovastatin 40 mg nightly  -Medications previously tried: NA  -Educated on Importance of limiting foods high in cholesterol; -Recommended to continue current medication  Diabetes (A1c goal <8%) -Uncontrolled -Current medications: . Ozempic 0.5 mg weekly  -Medications previously tried: Debria Garret, metformin, actos, januvia  -Current home glucose readings ranging 140-293.  -Denies hypoglycemic/hyperglycemic symptoms -Current meal patterns: eating high vegetable diet, but tends to eat plenty of high-starchy options (beans, corn, peas) -Current exercise: Patient is not following any routine exercise regimen -Educated on Prevention and  management of hypoglycemic episodes; Continuous glucose monitoring; Carbohydrate counting and/or plate method  -Patient consented to sharing blood glucose data from her FreeStyle Libre 2 -Counseled to check feet daily and get yearly eye exams -Recommended to continue current medication  Osteopenia (Goal Maintain bone density and prevent fractures) -Controlled -Last DEXA Scan: 09/29/18   T-Score femoral neck: -1.4  T-Score total hip: -0.4  T-Score lumbar spine: +0.9  T-Score forearm radius: NA  10-year  probability of major osteoporotic fracture: NA  10-year probability of hip fracture: NA -Patient is not a candidate for pharmacologic treatment -Current treatment  . None -Medications previously tried: NA  -Recommend [phone] units of vitamin D daily. Recommend weight-bearing and muscle strengthening exercises for building and maintaining bone density. -Recommended to continue current medication   Patient Goals/Self-Care Activities . Over the next 90 days, patient will:  - check glucose at least 3 times daily, document, and provide at future appointments check blood pressure weekly, document, and provide at future appointments  Follow Up Plan: Telephone follow up appointment with care management team member scheduled for: 03/28/2021 at 9:00 AM      Patient agreed to services and verbal consent obtained.   The patient verbalized understanding of instructions, educational materials, and care plan provided today and declined offer to receive copy of patient instructions, educational materials, and care plan.   [place] [phone]
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
## 2020-10-01 DIAGNOSIS — J301 Allergic rhinitis due to pollen: Secondary | ICD-10-CM | POA: Diagnosis not present

## 2020-10-01 DIAGNOSIS — J3089 Other allergic rhinitis: Secondary | ICD-10-CM | POA: Diagnosis not present

## 2020-10-08 DIAGNOSIS — J301 Allergic rhinitis due to pollen: Secondary | ICD-10-CM | POA: Diagnosis not present

## 2020-10-08 DIAGNOSIS — J3089 Other allergic rhinitis: Secondary | ICD-10-CM | POA: Diagnosis not present

## 2020-10-09 DIAGNOSIS — I152 Hypertension secondary to endocrine disorders: Secondary | ICD-10-CM | POA: Diagnosis not present

## 2020-10-09 DIAGNOSIS — E1169 Type 2 diabetes mellitus with other specified complication: Secondary | ICD-10-CM | POA: Diagnosis not present

## 2020-10-09 DIAGNOSIS — E1159 Type 2 diabetes mellitus with other circulatory complications: Secondary | ICD-10-CM | POA: Diagnosis not present

## 2020-10-09 DIAGNOSIS — E785 Hyperlipidemia, unspecified: Secondary | ICD-10-CM | POA: Diagnosis not present

## 2020-10-09 DIAGNOSIS — E1165 Type 2 diabetes mellitus with hyperglycemia: Secondary | ICD-10-CM | POA: Diagnosis not present

## 2020-10-09 LAB — HEMOGLOBIN A1C: Hemoglobin A1C: 8.1

## 2020-10-15 DIAGNOSIS — J3089 Other allergic rhinitis: Secondary | ICD-10-CM | POA: Diagnosis not present

## 2020-10-15 DIAGNOSIS — J301 Allergic rhinitis due to pollen: Secondary | ICD-10-CM | POA: Diagnosis not present

## 2020-10-15 DIAGNOSIS — J3081 Allergic rhinitis due to animal (cat) (dog) hair and dander: Secondary | ICD-10-CM | POA: Diagnosis not present

## 2020-10-22 DIAGNOSIS — J301 Allergic rhinitis due to pollen: Secondary | ICD-10-CM | POA: Diagnosis not present

## 2020-10-22 DIAGNOSIS — J3081 Allergic rhinitis due to animal (cat) (dog) hair and dander: Secondary | ICD-10-CM | POA: Diagnosis not present

## 2020-10-22 DIAGNOSIS — J3089 Other allergic rhinitis: Secondary | ICD-10-CM | POA: Diagnosis not present

## 2020-10-30 ENCOUNTER — Other Ambulatory Visit: Payer: Self-pay

## 2020-10-30 ENCOUNTER — Ambulatory Visit (INDEPENDENT_AMBULATORY_CARE_PROVIDER_SITE_OTHER): Payer: Medicare Other | Admitting: Family Medicine

## 2020-10-30 VITALS — BP 141/69 | HR 67 | Ht 59.5 in | Wt 157.4 lb

## 2020-10-30 DIAGNOSIS — R413 Other amnesia: Secondary | ICD-10-CM | POA: Diagnosis not present

## 2020-10-30 DIAGNOSIS — E1169 Type 2 diabetes mellitus with other specified complication: Secondary | ICD-10-CM

## 2020-10-30 DIAGNOSIS — Z289 Immunization not carried out for unspecified reason: Secondary | ICD-10-CM

## 2020-10-30 DIAGNOSIS — K760 Fatty (change of) liver, not elsewhere classified: Secondary | ICD-10-CM

## 2020-10-30 DIAGNOSIS — N951 Menopausal and female climacteric states: Secondary | ICD-10-CM

## 2020-10-30 DIAGNOSIS — E559 Vitamin D deficiency, unspecified: Secondary | ICD-10-CM | POA: Diagnosis not present

## 2020-10-30 DIAGNOSIS — E7800 Pure hypercholesterolemia, unspecified: Secondary | ICD-10-CM | POA: Diagnosis not present

## 2020-10-30 DIAGNOSIS — I10 Essential (primary) hypertension: Secondary | ICD-10-CM | POA: Diagnosis not present

## 2020-10-30 DIAGNOSIS — B0229 Other postherpetic nervous system involvement: Secondary | ICD-10-CM | POA: Diagnosis not present

## 2020-10-30 MED ORDER — SHINGRIX 50 MCG/0.5ML IM SUSR: 0.5000 mL | Freq: Once | INTRAMUSCULAR | 0 refills | Status: AC

## 2020-10-30 MED ORDER — TETANUS-DIPHTH-ACELL PERTUSSIS 5-2.5-18.5 LF-MCG/0.5 IM SUSP: 0.5000 mL | Freq: Once | INTRAMUSCULAR | 0 refills | Status: AC

## 2020-10-30 MED ORDER — ESTRADIOL 0.5 MG PO TABS: 0.5000 mg | ORAL_TABLET | ORAL | Status: DC
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
## 2020-10-30 NOTE — Patient Instructions (Addendum)
.   Reduce estradiol to 1/2 tablet daily

## 2020-10-31 LAB — COMPREHENSIVE METABOLIC PANEL
ALT: 19 IU/L (ref 0–32)
AST: 20 IU/L (ref 0–40)
Albumin/Globulin Ratio: 1.5 (ref 1.2–2.2)
Albumin: 4.4 g/dL (ref 3.7–4.7)
Alkaline Phosphatase: 62 IU/L (ref 44–121)
BUN/Creatinine Ratio: 14 (ref 12–28)
BUN: 20 mg/dL (ref 8–27)
Bilirubin Total: 0.4 mg/dL (ref 0.0–1.2)
CO2: 20 mmol/L (ref 20–29)
Calcium: 8.9 mg/dL (ref 8.7–10.3)
Chloride: 98 mmol/L (ref 96–106)
Creatinine, Ser: 1.39 mg/dL — ABNORMAL HIGH (ref 0.57–1.00)
Globulin, Total: 2.9 g/dL (ref 1.5–4.5)
Glucose: 127 mg/dL — ABNORMAL HIGH (ref 65–99)
Potassium: 4.2 mmol/L (ref 3.5–5.2)
Sodium: 142 mmol/L (ref 134–144)
Total Protein: 7.3 g/dL (ref 6.0–8.5)
eGFR: 41 mL/min/{1.73_m2} — ABNORMAL LOW (ref >59)

## 2020-10-31 LAB — LIPID PANEL
Chol/HDL Ratio: 3.8 ratio (ref 0.0–4.4)
Cholesterol, Total: 154 mg/dL (ref 100–199)
HDL: 41 mg/dL (ref >39)
LDL Chol Calc (NIH): 87 mg/dL (ref 0–99)
Triglycerides: 149 mg/dL (ref 0–149)
VLDL Cholesterol Cal: 26 mg/dL (ref 5–40)

## 2020-10-31 LAB — CBC
Hematocrit: 37.8 % (ref 34.0–46.6)
Hemoglobin: 12.4 g/dL (ref 11.1–15.9)
MCH: 27.4 pg (ref 26.6–33.0)
MCHC: 32.8 g/dL (ref 31.5–35.7)
MCV: 83 fL (ref 79–97)
Platelets: 299 10*3/uL (ref 150–450)
RBC: 4.53 x10E6/uL (ref 3.77–5.28)
RDW: 14.4 % (ref 11.7–15.4)
WBC: 8.5 10*3/uL (ref 3.4–10.8)

## 2020-10-31 LAB — VITAMIN D 25 HYDROXY (VIT D DEFICIENCY, FRACTURES): Vit D, 25-Hydroxy: 58.7 ng/mL (ref 30.0–100.0)

## 2020-10-31 LAB — TSH: TSH: 0.965 u[IU]/mL (ref 0.450–4.500)

## 2020-11-01 DIAGNOSIS — J3089 Other allergic rhinitis: Secondary | ICD-10-CM | POA: Diagnosis not present

## 2020-11-01 DIAGNOSIS — J301 Allergic rhinitis due to pollen: Secondary | ICD-10-CM | POA: Diagnosis not present

## 2020-11-05 ENCOUNTER — Telehealth: Payer: Self-pay

## 2020-11-05 DIAGNOSIS — Z111 Encounter for screening for respiratory tuberculosis: Secondary | ICD-10-CM

## 2020-11-05 NOTE — Telephone Encounter (Signed)
Copied from [place] [id number]. Topic: General - Inquiry >> Nov 05, 2020 12:02 PM Oneta Rack wrote: Reason for CRM:  patient requesting TB  order for school, please advise >> Nov 05, 2020 12:37 PM Oneta Rack wrote: Patient is need of a Health Examination form for school and will drop off from.

## 2020-11-07 DIAGNOSIS — Z111 Encounter for screening for respiratory tuberculosis: Secondary | ICD-10-CM | POA: Diagnosis not present

## 2020-11-07 NOTE — Telephone Encounter (Signed)
Can print order for TB gold and leave for her to pick up.

## 2020-11-07 NOTE — Telephone Encounter (Signed)
Left message to call back to advise patient.

## 2020-11-07 NOTE — Addendum Note (Signed)
Addended by: Birdie Sons on: 11/07/2020 10:46 AM   Modules accepted: Orders

## 2020-11-07 NOTE — Telephone Encounter (Signed)
Advised patient

## 2020-11-07 NOTE — Addendum Note (Signed)
Addended by: Wilburt Finlay on: 11/07/2020 01:52 PM   Modules accepted: Orders

## 2020-11-10 LAB — QUANTIFERON-TB GOLD PLUS
QuantiFERON Mitogen Value: >10 IU/mL
QuantiFERON Nil Value: 0.44 IU/mL
QuantiFERON TB1 Ag Value: 0.23 IU/mL
QuantiFERON TB2 Ag Value: 0.25 IU/mL
QuantiFERON-TB Gold Plus: NEGATIVE

## 2020-11-11 ENCOUNTER — Telehealth: Payer: Self-pay

## 2020-11-11 NOTE — Telephone Encounter (Signed)
Pt advised.  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  Have you been able to complete it?  Thanks,   -Mickel Baas

## 2020-11-11 NOTE — Telephone Encounter (Signed)
-----   Message from Birdie Sons, MD sent at 11/11/2020  7:29 AM EDT ----- TB test is negtive

## 2020-11-12 DIAGNOSIS — J3089 Other allergic rhinitis: Secondary | ICD-10-CM | POA: Diagnosis not present

## 2020-11-12 DIAGNOSIS — J301 Allergic rhinitis due to pollen: Secondary | ICD-10-CM | POA: Diagnosis not present

## 2020-11-13 ENCOUNTER — Telehealth: Payer: Self-pay

## 2020-11-13 NOTE — Telephone Encounter (Signed)
I called patient and informed her that her form is ready for pick up. She notified me that she would come tomorrow.

## 2020-11-13 NOTE — Telephone Encounter (Signed)
Copied from [place] [phone]. Topic: General - Other >> Nov 13, 2020 11:46 AM Tessa Lerner A wrote: Reason for CRM: Patient has returned missed call from staff member T. Freddi Starr  Patient would like would like to be contacted again when available

## 2020-11-19 DIAGNOSIS — J3089 Other allergic rhinitis: Secondary | ICD-10-CM | POA: Diagnosis not present

## 2020-11-19 DIAGNOSIS — J301 Allergic rhinitis due to pollen: Secondary | ICD-10-CM | POA: Diagnosis not present

## 2020-11-26 DIAGNOSIS — J301 Allergic rhinitis due to pollen: Secondary | ICD-10-CM | POA: Diagnosis not present

## 2020-11-26 DIAGNOSIS — J3089 Other allergic rhinitis: Secondary | ICD-10-CM | POA: Diagnosis not present

## 2020-12-03 DIAGNOSIS — J301 Allergic rhinitis due to pollen: Secondary | ICD-10-CM | POA: Diagnosis not present

## 2020-12-03 DIAGNOSIS — J3089 Other allergic rhinitis: Secondary | ICD-10-CM | POA: Diagnosis not present

## 2020-12-06 ENCOUNTER — Other Ambulatory Visit: Payer: Self-pay | Admitting: Family Medicine

## 2020-12-06 DIAGNOSIS — N951 Menopausal and female climacteric states: Secondary | ICD-10-CM

## 2020-12-06 MED ORDER — ESTRADIOL 0.5 MG PO TABS: 0.5000 mg | ORAL_TABLET | ORAL | 3 refills | Status: DC

## 2020-12-06 MED ORDER — ESTRADIOL 0.5 MG PO TABS: 0.5000 mg | ORAL_TABLET | ORAL | Status: DC

## 2020-12-06 NOTE — Telephone Encounter (Signed)
Requested medication (s) are due for refill today:   Unknown  Requested medication (s) are on the active medication list:   Yes  Future visit scheduled:   No   Last ordered: 10/30/2020 No dispensing or refill instructions on the rx   Requested Prescriptions  Pending Prescriptions Disp Refills   estradiol (ESTRACE) 0.5 MG tablet [Pharmacy Med Name: ESTRADIOL 0.5MG  TABLETS] 90 tablet     Sig: TAKE 1 TABLET BY MOUTH EVERY DAY      OB/GYN:  Estrogens Failed - 12/06/2020 12:09 PM      Failed - Last BP in normal range    BP Readings from Last 1 Encounters:  10/30/20 (!) 141/69          Passed - Mammogram is up-to-date per Health Maintenance      Passed - Valid encounter within last 12 months    Recent Outpatient Visits           1 month ago Essential (primary) hypertension   [facility], MD   12 months ago Type 2 diabetes mellitus with other specified complication, without long-term current use of insulin [facility])   [facility] Birdie Sons, MD   1 year ago Eczema, unspecified type   [place], Wendee Beavers, PA-C   1 year ago Shortness of breath   [facility], Kelby Aline, FNP   1 year ago Other postherpetic nervous system involvement   [place], Kirstie Peri, MD

## 2020-12-06 NOTE — Telephone Encounter (Signed)
Medication: estradiol (ESTRACE) 0.5 MG tablet  Has the pt contacted their pharmacy? yes  Preferred pharmacy: [facility]  Please be advised refills may take up to 3 business days.  We ask that you follow up with your pharmacy.

## 2020-12-09 ENCOUNTER — Other Ambulatory Visit: Payer: Self-pay | Admitting: Family Medicine

## 2020-12-12 DIAGNOSIS — J301 Allergic rhinitis due to pollen: Secondary | ICD-10-CM | POA: Diagnosis not present

## 2020-12-12 DIAGNOSIS — J3089 Other allergic rhinitis: Secondary | ICD-10-CM | POA: Diagnosis not present

## 2020-12-17 DIAGNOSIS — J3089 Other allergic rhinitis: Secondary | ICD-10-CM | POA: Diagnosis not present

## 2020-12-17 DIAGNOSIS — J301 Allergic rhinitis due to pollen: Secondary | ICD-10-CM | POA: Diagnosis not present

## 2020-12-24 ENCOUNTER — Other Ambulatory Visit: Payer: Self-pay | Admitting: Family Medicine

## 2020-12-24 DIAGNOSIS — R413 Other amnesia: Secondary | ICD-10-CM

## 2020-12-24 DIAGNOSIS — J3089 Other allergic rhinitis: Secondary | ICD-10-CM | POA: Diagnosis not present

## 2020-12-24 DIAGNOSIS — J301 Allergic rhinitis due to pollen: Secondary | ICD-10-CM | POA: Diagnosis not present

## 2021-01-06 ENCOUNTER — Telehealth: Payer: Self-pay

## 2021-01-06 NOTE — Progress Notes (Signed)
Chronic Care Management Pharmacy Assistant   Name: Sharon Nelson  MRN: [id number] DOB: 22-Nov-1950  Reason for Encounter:Diabetes Disease State Call.   Recent office visits:  10/30/2020 Dr.Fisher MD (PCP) Reduce estradiol to 1/2 tablet daily  Recent consult visits:  10/09/2020 Dr.O'Connell MD (Endocrinology) Increase Ozempic to 1 mg once a week.  Hospital visits:  None in previous 6 months  Medications: Outpatient Encounter Medications as of 01/06/2021  Medication Sig Note   ACCU-CHEK AVIVA PLUS test strip USE TO TEST ONCE DAILY AS DIRECTED    ADVAIR HFA 115-21 MCG/ACT inhaler Inhale 2 puffs into the lungs 2 (two) times daily.     albuterol (VENTOLIN HFA) 108 (90 Base) MCG/ACT inhaler Inhale into the lungs every 4 (four) hours as needed.  01/25/2015: Received from: [place] 24 MCG capsule TAKE 1 CAPSULE BY MOUTH TWICE DAILY (Patient taking differently: Taking as needed)    aspirin 81 MG tablet Take 81 mg by mouth daily.  01/25/2015: Received from: [place]   azelastine (OPTIVAR) 0.05 % ophthalmic solution 1 drop 2 (two) times daily.    BIOTIN PO Take by mouth daily. When remembers    Blood Glucose Monitoring Suppl (ACCU-CHEK AVIVA PLUS) w/Device KIT Use to check blood sugar for type 2 diabetes.    Cholecalciferol 2000 UNITS CAPS Take by mouth daily.  01/25/2015: Received from: [place]   Continuous Blood Gluc Receiver (FREESTYLE LIBRE 2 READER) DEVI USE 1 DEVICE AS DIRECTED    Cyanocobalamin (VITAMIN B 12 PO) Take by mouth daily. When remembers    donepezil (ARICEPT) 10 MG tablet TAKE 1 TABLET(10 MG) BY MOUTH DAILY    EPINEPHrine 0.3 mg/0.3 mL IJ SOAJ injection  02/05/2016: PRN    erythromycin ophthalmic ointment Apply to sutures 4 times a day for 10-12 days.  Discontinue if allergy develops and call our office    estradiol (ESTRACE) 0.5 MG tablet Take 1 tablet (0.5 mg total) by mouth every other day.    furosemide  (LASIX) 20 MG tablet Take 1 tablet (20 mg total) by mouth daily as needed for edema.    gabapentin (NEURONTIN) 600 MG tablet TAKE 1 TABLET BY MOUTH THREE TIMES DAILY    levocetirizine (XYZAL) 5 MG tablet Take 5 mg by mouth every evening.  01/25/2015: Received from: [place]   lidocaine (LIDODERM) 5 % PLACE 1 PATCH ONTO SKIN EVERY DAY FOR POST HERPETIC NEURALGIA. REMOVE AND DISCARD AFTER 12 HOURS OR AS DIRECTED BY DOCTOR    lovastatin (MEVACOR) 40 MG tablet Take 1 tablet by mouth at bedtime 01/25/2015: Received from: [place]   mometasone (ELOCON) 0.1 % cream     Semaglutide (OZEMPIC, 1 MG/DOSE, [place]) Inject 0.5 mg into the skin once a week. Mondays 09/19/2020: Prescribed by Dr. Honor Junes    spironolactone (ALDACTONE) 100 MG tablet Take 1 tablet (100 mg total) by mouth daily.    traMADol (ULTRAM) 50 MG tablet Take 1 every 4-6 hours as needed for pain not controlled by Tylenol    triamcinolone cream (KENALOG) 0.1 % Apply 1 application topically 2 (two) times daily.    No facility-administered encounter medications on file as of 01/06/2021.    Star Rating Drugs: Ozempic 1 mg last filled on 01/03/2021 for 28 day supply at [facility].  Lovastatin 40 mg last filled on 10/23/2020 for 90 day supply at [facility].  Recent Relevant Labs: Lab Results  Component Value  Date/Time   HGBA1C 8.1 10/09/2020 12:00 AM   HGBA1C 8.4 04/25/2020 12:00 AM   MICROALBUR negative 05/15/2019 09:14 AM   MICROALBUR 20 02/23/2018 10:00 AM    Kidney Function Lab Results  Component Value Date/Time   CREATININE 1.39 (H) 10/30/2020 09:54 AM   CREATININE 0.94 08/17/2019 10:49 AM   CREATININE 1.04 02/12/2014 03:53 AM   CREATININE 0.98 02/11/2014 04:21 AM   GFRNONAA >60 08/17/2019 10:49 AM   GFRNONAA 56 (L) 02/12/2014 03:53 AM   GFRAA >60 08/17/2019 10:49 AM   GFRAA >60 02/12/2014 03:53 AM    Current antihyperglycemic regimen:  Ozempic 1 mg weekly What recent  interventions/DTPs have been made to improve glycemic control:  10/09/2020 Dr.O'Connell MD (Endocrinology) Increase Ozempic to 1 mg once a week. Have there been any recent hospitalizations or ED visits since last visit with CPP? No Patient reports hypoglycemic symptoms, including Vision changes Patient reports her vision is not as good as it was due to age Patient denies hyperglycemic symptoms, including excessive thirst, fatigue, polyuria, and weakness How often are you checking your blood sugar?  Patient has a Database administrator.Guided patient on how to set up the Elenor Legato deceive to connect the device to transmit blood sugar readings over to the clinical pharmacist online.Patient states she is unable to set up a password at the moment because of google e-mail is requiring her to use her e-mail password and she is unsure what that is.Patient states she has to pick up her grandson and will ask her daughter to help this afternoon.Patient reports she will call back to help her after she set up a password. What are your blood sugars ranging?  On 01/08/2021 it was 87,117,191, and 101. On 01/07/2021 it was 128. On 01/05/2021 it was 149,102,and 130. On 01/04/2021 it was 95,156, and 200 (patient reports she was on a trip at the beach and ate bad)  During the week, how often does your blood glucose drop below 70? Never Patient states she is unsure if her blood sugar has drop below 70. Are you checking your feet daily/regularly?   Patient denies numbness , pain or tingling in her feet.  Adherence Review: Is the patient currently on a STATIN medication? Yes Is the patient currently on ACE/ARB medication? No Does the patient have >5 day gap between last estimated fill dates? No  Patient has a telephone appointment schedule with clinical pharmacist on 03/28/2021 at 9:00 am.  [place] Clinical Pharmacist Assistant [phone]   Libe device hook up LVM 06/13,06/16

## 2021-01-07 DIAGNOSIS — J3089 Other allergic rhinitis: Secondary | ICD-10-CM | POA: Diagnosis not present

## 2021-01-07 DIAGNOSIS — J301 Allergic rhinitis due to pollen: Secondary | ICD-10-CM | POA: Diagnosis not present

## 2021-01-14 DIAGNOSIS — J3089 Other allergic rhinitis: Secondary | ICD-10-CM | POA: Diagnosis not present

## 2021-01-14 DIAGNOSIS — J301 Allergic rhinitis due to pollen: Secondary | ICD-10-CM | POA: Diagnosis not present

## 2021-01-21 DIAGNOSIS — J301 Allergic rhinitis due to pollen: Secondary | ICD-10-CM | POA: Diagnosis not present

## 2021-01-21 DIAGNOSIS — J3089 Other allergic rhinitis: Secondary | ICD-10-CM | POA: Diagnosis not present

## 2021-01-28 DIAGNOSIS — J3089 Other allergic rhinitis: Secondary | ICD-10-CM | POA: Diagnosis not present

## 2021-01-28 DIAGNOSIS — J301 Allergic rhinitis due to pollen: Secondary | ICD-10-CM | POA: Diagnosis not present

## 2021-02-04 DIAGNOSIS — J3089 Other allergic rhinitis: Secondary | ICD-10-CM | POA: Diagnosis not present

## 2021-02-04 DIAGNOSIS — J301 Allergic rhinitis due to pollen: Secondary | ICD-10-CM | POA: Diagnosis not present

## 2021-02-11 DIAGNOSIS — J3089 Other allergic rhinitis: Secondary | ICD-10-CM | POA: Diagnosis not present

## 2021-02-11 DIAGNOSIS — J301 Allergic rhinitis due to pollen: Secondary | ICD-10-CM | POA: Diagnosis not present

## 2021-02-12 DIAGNOSIS — E1159 Type 2 diabetes mellitus with other circulatory complications: Secondary | ICD-10-CM | POA: Diagnosis not present

## 2021-02-12 DIAGNOSIS — E1165 Type 2 diabetes mellitus with hyperglycemia: Secondary | ICD-10-CM | POA: Diagnosis not present

## 2021-02-12 DIAGNOSIS — E785 Hyperlipidemia, unspecified: Secondary | ICD-10-CM | POA: Diagnosis not present

## 2021-02-12 DIAGNOSIS — E1169 Type 2 diabetes mellitus with other specified complication: Secondary | ICD-10-CM | POA: Diagnosis not present

## 2021-02-12 DIAGNOSIS — I152 Hypertension secondary to endocrine disorders: Secondary | ICD-10-CM | POA: Diagnosis not present

## 2021-02-12 LAB — HEMOGLOBIN A1C: Hemoglobin A1C: 7.4

## 2021-02-16 IMAGING — CR DG CHEST 2V
1 series · 2 of 2 positions shown · non-contrast
Comparison: 11/19/2018

CLINICAL DATA: Chest pain

EXAM:
CHEST - 2 VIEW

[Series 1: dg chest 2 view · 0.14mm/px · 2 of 2 slices shown]
[im 1/2]
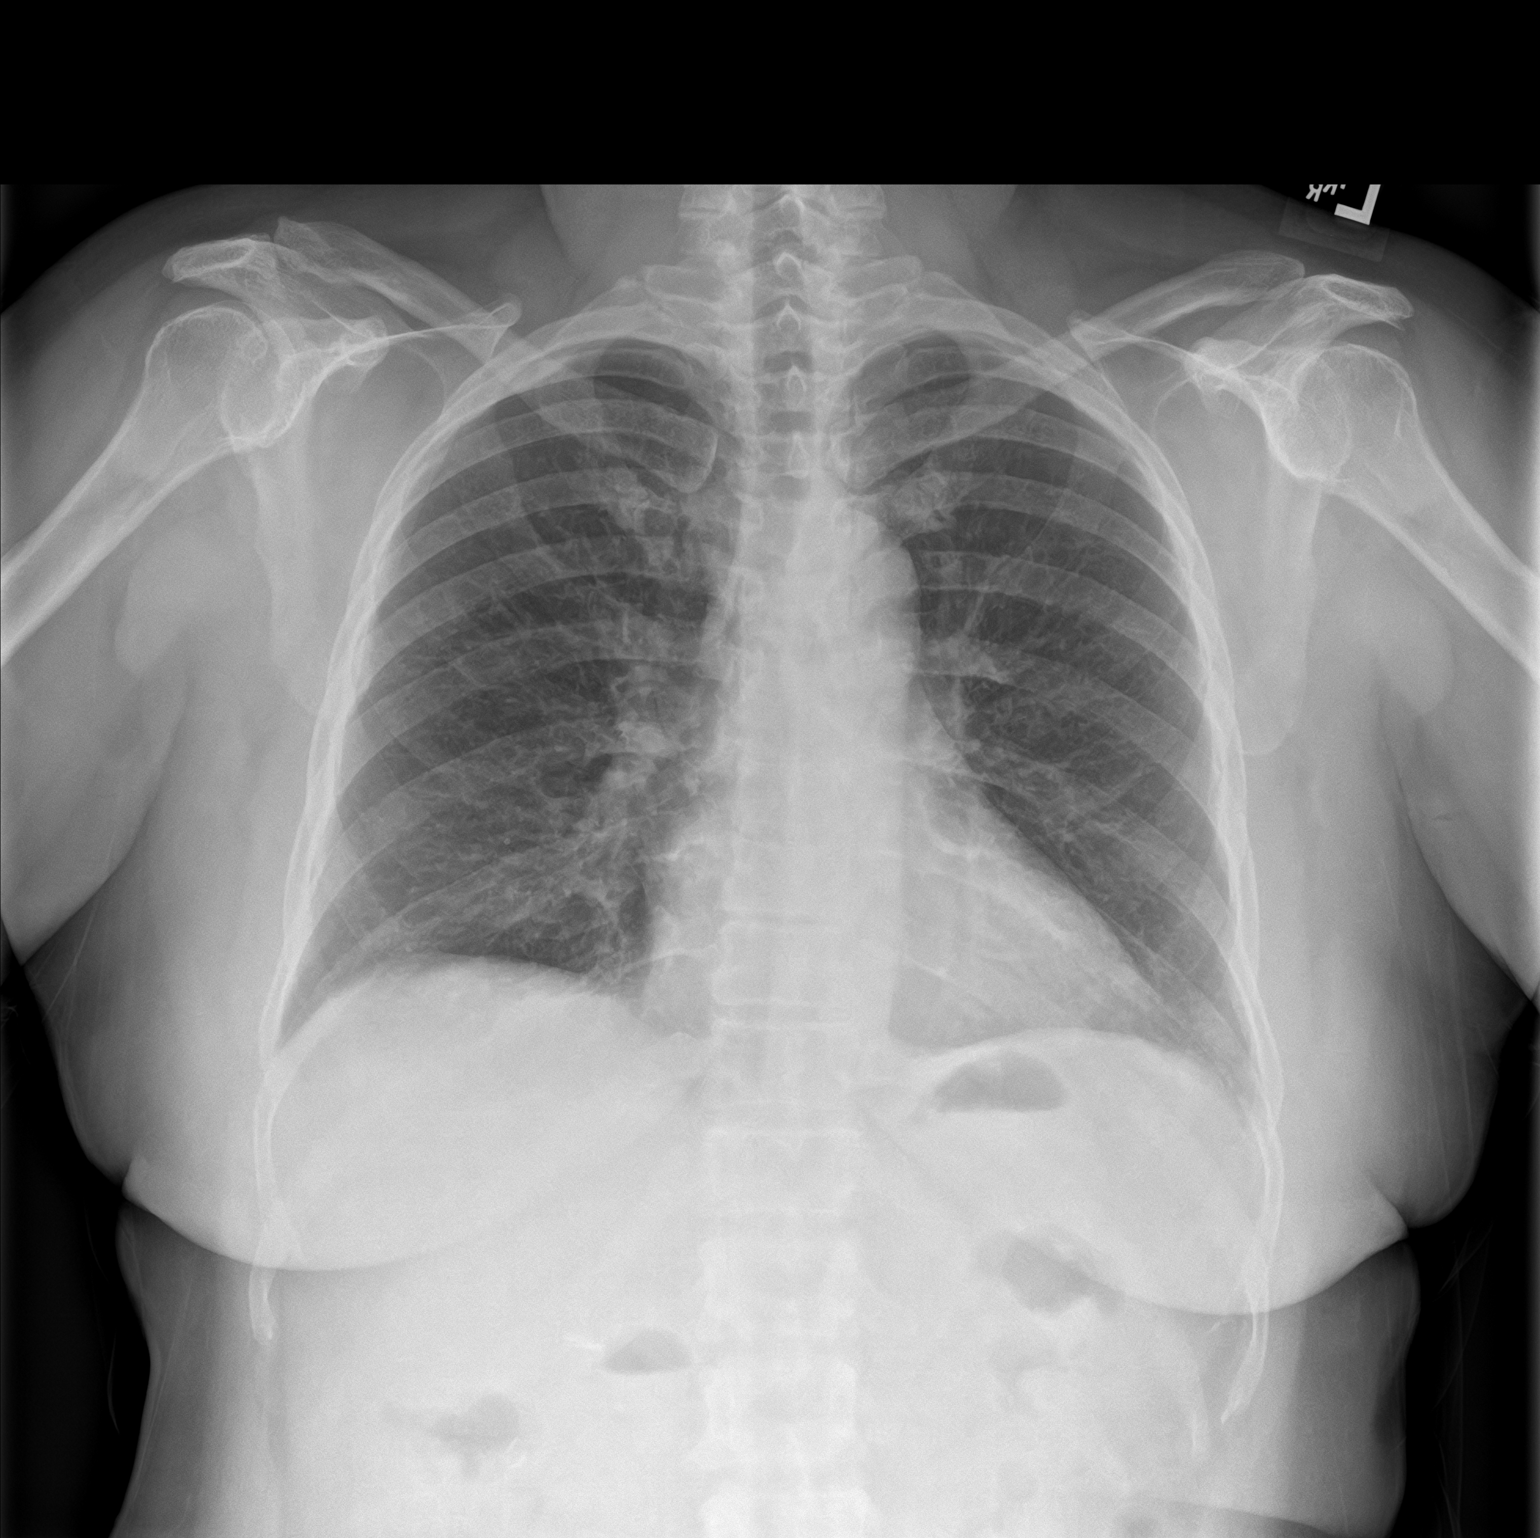
[im 2/2]
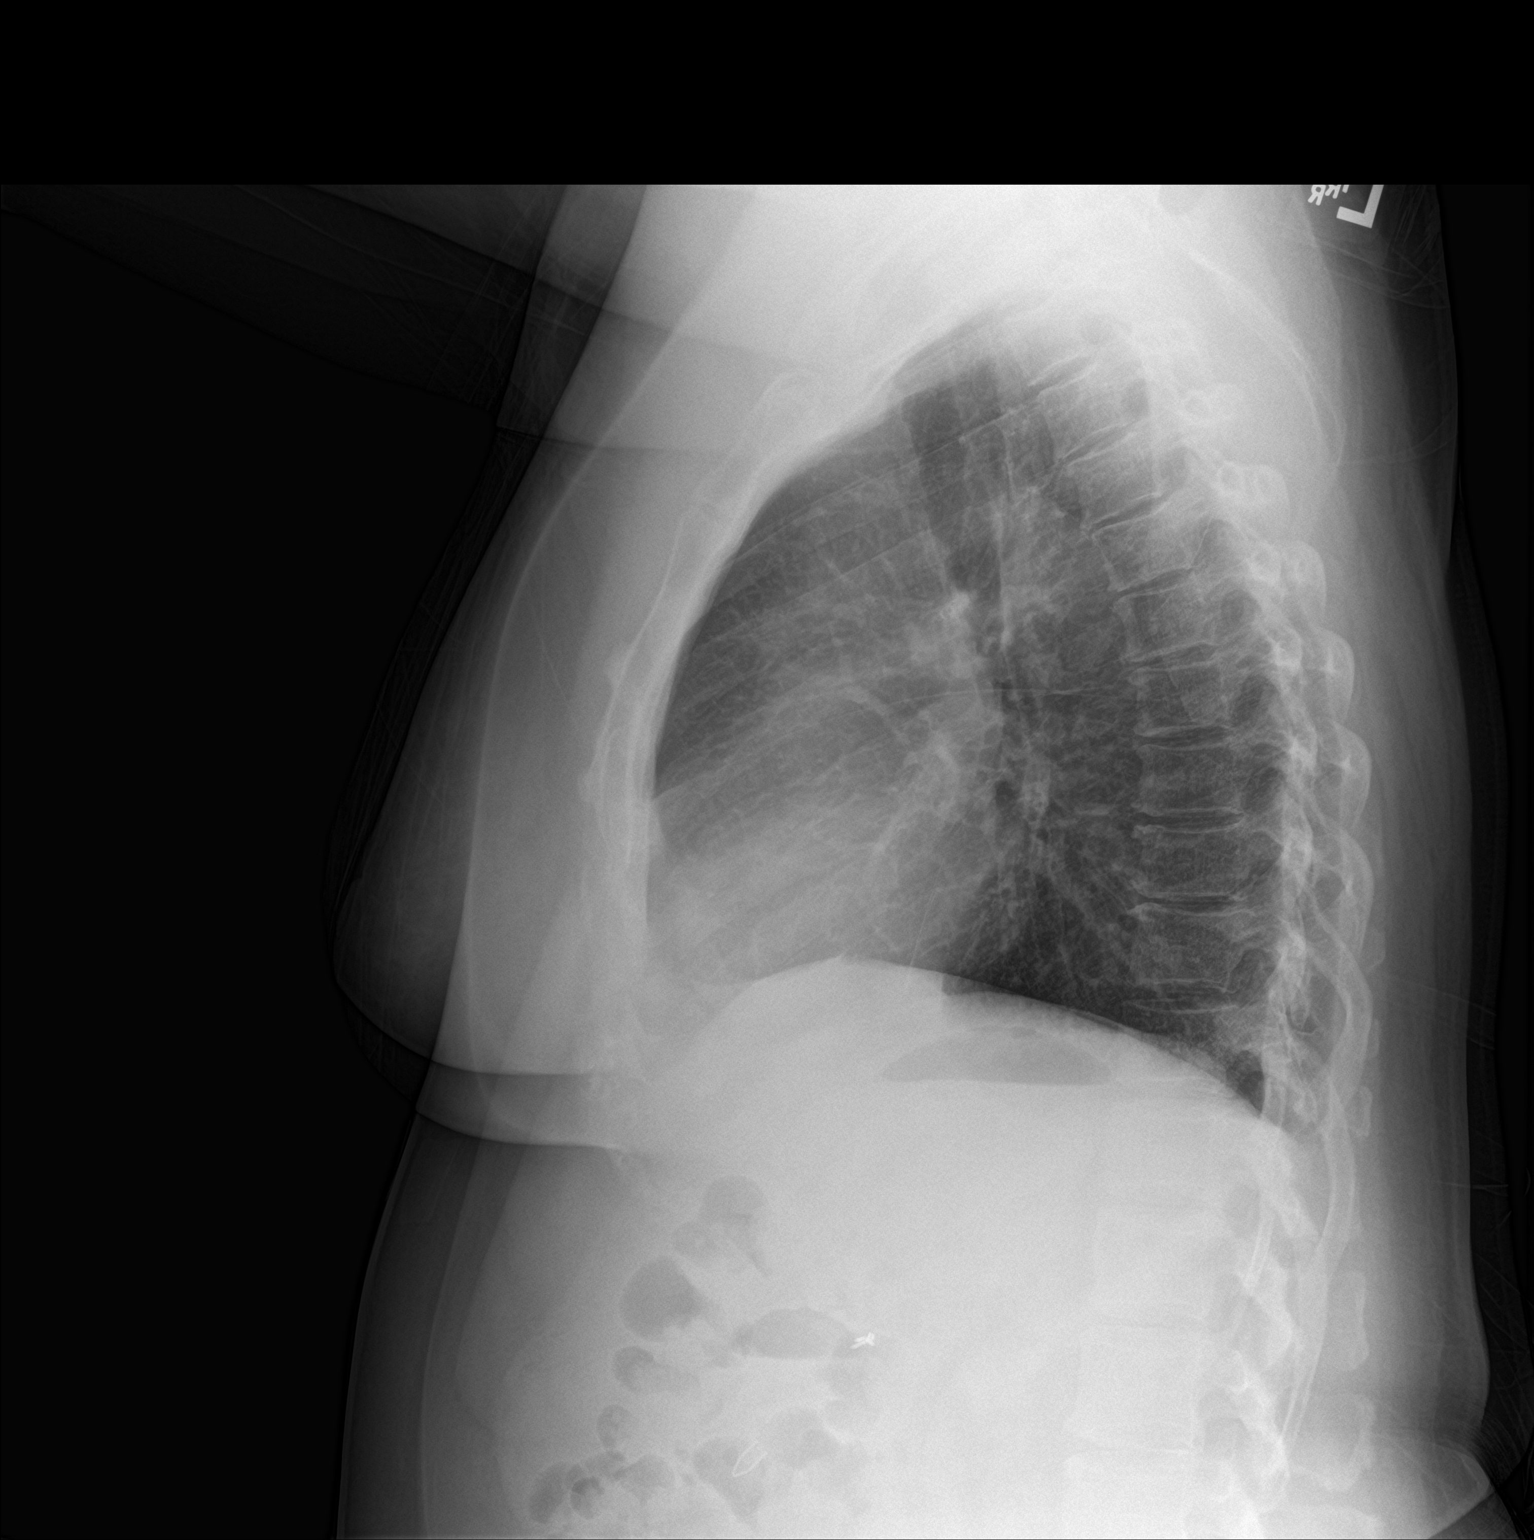

[2 of 2 positions shown; findings below may reference images not displayed]

FINDINGS: The heart size and mediastinal contours are within normal limits.
Both lungs are clear. The visualized skeletal structures are
unremarkable.
IMPRESSION: No active cardiopulmonary disease.

## 2021-02-18 DIAGNOSIS — J3089 Other allergic rhinitis: Secondary | ICD-10-CM | POA: Diagnosis not present

## 2021-02-18 DIAGNOSIS — J301 Allergic rhinitis due to pollen: Secondary | ICD-10-CM | POA: Diagnosis not present

## 2021-02-25 DIAGNOSIS — J301 Allergic rhinitis due to pollen: Secondary | ICD-10-CM | POA: Diagnosis not present

## 2021-02-25 DIAGNOSIS — J3089 Other allergic rhinitis: Secondary | ICD-10-CM | POA: Diagnosis not present

## 2021-02-27 DIAGNOSIS — J3089 Other allergic rhinitis: Secondary | ICD-10-CM | POA: Diagnosis not present

## 2021-02-27 DIAGNOSIS — J301 Allergic rhinitis due to pollen: Secondary | ICD-10-CM | POA: Diagnosis not present

## 2021-03-04 DIAGNOSIS — J3089 Other allergic rhinitis: Secondary | ICD-10-CM | POA: Diagnosis not present

## 2021-03-04 DIAGNOSIS — J3081 Allergic rhinitis due to animal (cat) (dog) hair and dander: Secondary | ICD-10-CM | POA: Diagnosis not present

## 2021-03-04 DIAGNOSIS — J301 Allergic rhinitis due to pollen: Secondary | ICD-10-CM | POA: Diagnosis not present

## 2021-03-06 ENCOUNTER — Telehealth: Payer: Self-pay

## 2021-03-06 NOTE — Progress Notes (Signed)
Chronic Care Management Pharmacy Assistant   Name: Sharon Nelson  MRN: [id number] DOB: Dec 05, 1950  Reason for Encounter:Diabetes Disease State Call.   Recent office visits:  No recent Office Visit  Recent consult visits:  No recent [facility] visits:  None in previous 6 months  Medications: Outpatient Encounter Medications as of 03/06/2021  Medication Sig Note   ACCU-CHEK AVIVA PLUS test strip USE TO TEST ONCE DAILY AS DIRECTED    ADVAIR HFA 115-21 MCG/ACT inhaler Inhale 2 puffs into the lungs 2 (two) times daily.     albuterol (VENTOLIN HFA) 108 (90 Base) MCG/ACT inhaler Inhale into the lungs every 4 (four) hours as needed.  01/25/2015: Received from: [facility] 24 MCG capsule TAKE 1 CAPSULE BY MOUTH TWICE DAILY (Patient taking differently: Taking as needed)    aspirin 81 MG tablet Take 81 mg by mouth daily.  01/25/2015: Received from: [place]   azelastine (OPTIVAR) 0.05 % ophthalmic solution 1 drop 2 (two) times daily.    BIOTIN PO Take by mouth daily. When remembers    Blood Glucose Monitoring Suppl (ACCU-CHEK AVIVA PLUS) w/Device KIT Use to check blood sugar for type 2 diabetes.    Cholecalciferol 2000 UNITS CAPS Take by mouth daily.  01/25/2015: Received from: [place]   Continuous Blood Gluc Receiver (FREESTYLE LIBRE 2 READER) DEVI USE 1 DEVICE AS DIRECTED    Cyanocobalamin (VITAMIN B 12 PO) Take by mouth daily. When remembers    donepezil (ARICEPT) 10 MG tablet TAKE 1 TABLET(10 MG) BY MOUTH DAILY    EPINEPHrine 0.3 mg/0.3 mL IJ SOAJ injection  02/05/2016: PRN    erythromycin ophthalmic ointment Apply to sutures 4 times a day for 10-12 days.  Discontinue if allergy develops and call our office    estradiol (ESTRACE) 0.5 MG tablet Take 1 tablet (0.5 mg total) by mouth every other day.    furosemide (LASIX) 20 MG tablet Take 1 tablet (20 mg total) by mouth daily as needed for edema.    gabapentin  (NEURONTIN) 600 MG tablet TAKE 1 TABLET BY MOUTH THREE TIMES DAILY    levocetirizine (XYZAL) 5 MG tablet Take 5 mg by mouth every evening.  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 REMOVE AND DISCARD AFTER 12 HOURS OR AS DIRECTED BY DOCTOR    lovastatin (MEVACOR) 40 MG tablet Take 1 tablet by mouth at bedtime 01/25/2015: Received from: [place]   mometasone (ELOCON) 0.1 % cream     Semaglutide (OZEMPIC, 1 MG/DOSE, [place]) Inject 0.5 mg into the skin once a week. Mondays 09/19/2020: Prescribed by Dr. Honor Junes    spironolactone (ALDACTONE) 100 MG tablet Take 1 tablet (100 mg total) by mouth daily.    traMADol (ULTRAM) 50 MG tablet Take 1 every 4-6 hours as needed for pain not controlled by Tylenol    triamcinolone cream (KENALOG) 0.1 % Apply 1 application topically 2 (two) times daily.    No facility-administered encounter medications on file as of 03/06/2021.    Care Gaps: Shingrix Vaccine Urine Microalbumin COVID-19 Vaccine Ophthalmology Exam Influenza Vaccine Star Rating Drugs: Ozempic 1 mg last filled on 02/20/2021 for 28 day supply at [facility].  Lovastatin 40 mg last filled on 10/23/2020 for 90 day supply at [facility].  Medication Fill Gaps: Gabapentin 600 MG last filled on 09/17/2020  for 90 day supply  Recent Relevant Labs: Lab Results  Component Value Date/Time   HGBA1C 8.1 10/09/2020 12:00 AM   HGBA1C 8.4 04/25/2020 12:00 AM   MICROALBUR negative 05/15/2019 09:14 AM   MICROALBUR 20 02/23/2018 10:00 AM    Kidney Function Lab Results  Component Value Date/Time   CREATININE 1.39 (H) 10/30/2020 09:54 AM   CREATININE 0.94 08/17/2019 10:49 AM   CREATININE 1.04 02/12/2014 03:53 AM   CREATININE 0.98 02/11/2014 04:21 AM   GFRNONAA >60 08/17/2019 10:49 AM   GFRNONAA 56 (L) 02/12/2014 03:53 AM   GFRAA >60 08/17/2019 10:49 AM   GFRAA >60  02/12/2014 03:53 AM    Current antihyperglycemic regimen:  Ozempic 2 mg weekly What recent interventions/DTPs have been made to improve glycemic control:  Patient states her Endocrinologist increase her Ozempic to 2 mg weekly but she hasn't started yet,she just pick up the medication on 02/20/2021. Have there been any recent hospitalizations or ED visits since last visit with CPP? No Patient reports hypoglycemic symptoms, including Vision changes Patient reports her vision is still not as good as it was due to age Patient denies hyperglycemic symptoms, including blurry vision, excessive thirst, fatigue, polyuria, and weakness How often are you checking your blood sugar?  Patient has a Database administrator.Per note on 01/06/2021 Guided patient on how to set up the [place] deceive to connect the device to transmit blood sugar readings over to the clinical pharmacist online.Patient states she is unable to set up a password at the moment because of google e-mail is requiring her to use her e-mail password and she is unsure what that is.Patient states she has to pick up her grandson and will ask her daughter to help this afternoon.Patient reports she will call back to help her after she set up a password. Patient states she has not tried to hook up the deceive since the last time she spoke to me, but she will try again soon. What are your blood sugars ranging?  On 03/07/2021 it was 126,69. On 03/06/2021 it was 138,67. On 03/05/2021 it was 136. On 03/04/2021 it was 147. During the week, how often does your blood glucose drop below 70?  Patient states her blood sugar drop below 70 on 03/07/2021,and 03/06/2021.   Are you checking your feet daily/regularly?      Patient denies numbness , pain or tingling in her feet  Patient states she has been breaking out with a rash on her arm,neck,chest, and back.Patient states this has been a on and off issue for about three months now,but she is unsure if one of her  medication is causing it.Patient states she is using Calamine to help with the itching.Patient reports the only  recent change she had is a different hairspray that she stop using,but the rash is still there.I informed patient I would Notified the Clinical Pharmacist,but if the rash is getting worse or she develops other symptoms she should schedule a appointment with her PCP or go to Urgent Care.Patient Verbalized understanding.Patients states she mention the rash to her PCP before,but he wanted her to see a dermatologist.Patient states she is just concern it can be one of her current medication she is taking.  Adherence Review: Is the patient currently on a STATIN medication? Yes Is the patient currently on ACE/ARB medication? No Does the patient have >5 day gap between last estimated fill dates? Yes  [place] Pharmacist Assistant [phone]

## 2021-03-07 DIAGNOSIS — J301 Allergic rhinitis due to pollen: Secondary | ICD-10-CM | POA: Diagnosis not present

## 2021-03-07 DIAGNOSIS — J3089 Other allergic rhinitis: Secondary | ICD-10-CM | POA: Diagnosis not present

## 2021-03-11 DIAGNOSIS — J3089 Other allergic rhinitis: Secondary | ICD-10-CM | POA: Diagnosis not present

## 2021-03-11 DIAGNOSIS — J301 Allergic rhinitis due to pollen: Secondary | ICD-10-CM | POA: Diagnosis not present

## 2021-03-13 ENCOUNTER — Ambulatory Visit: Payer: Self-pay | Admitting: *Deleted

## 2021-03-13 DIAGNOSIS — R1084 Generalized abdominal pain: Secondary | ICD-10-CM | POA: Diagnosis not present

## 2021-03-13 DIAGNOSIS — R81 Glycosuria: Secondary | ICD-10-CM | POA: Diagnosis not present

## 2021-03-13 DIAGNOSIS — N390 Urinary tract infection, site not specified: Secondary | ICD-10-CM | POA: Diagnosis not present

## 2021-03-13 NOTE — Telephone Encounter (Signed)
Reason for Disposition  [1] MILD-MODERATE pain AND [2] constant AND [3] present > 2 hours  Answer Assessment - Initial Assessment Questions 1. LOCATION: "Where does it hurt?"      Lower midline to lower abdominal pain 2. RADIATION: "Does the pain shoot anywhere else?" (e.g., chest, back)     no 3. ONSET: "When did the pain begin?" (e.g., minutes, hours or days ago)      yesterday 4. SUDDEN: "Gradual or sudden onset?"     gradual 5. PATTERN "Does the pain come and go, or is it constant?"    - If constant: "Is it getting better, staying the same, or worsening?"      (Note: Constant means the pain never goes away completely; most serious pain is constant and it progresses)     - If intermittent: "How long does it last?" "Do you have pain now?"     (Note: Intermittent means the pain goes away completely between bouts)     constant 6. SEVERITY: "How bad is the pain?"  (e.g., Scale 1-10; mild, moderate, or severe)   - MILD (1-3): doesn't interfere with normal activities, abdomen soft and not tender to touch    - MODERATE (4-7): interferes with normal activities or awakens from sleep, abdomen tender to touch    - SEVERE (8-10): excruciating pain, doubled over, unable to do any normal activities      moderate 7. RECURRENT SYMPTOM: "Have you ever had this type of stomach pain before?" If Yes, ask: "When was the last time?" and "What happened that time?"      GI pain before- different 8. CAUSE: "What do you think is causing the stomach pain?"     no 9. RELIEVING/AGGRAVATING FACTORS: "What makes it better or worse?" (e.g., movement, antacids, bowel movement)     no 10. OTHER SYMPTOMS: "Do you have any other symptoms?" (e.g., back pain, diarrhea, fever, urination pain, vomiting)       no 11. PREGNANCY: "Is there any chance you are pregnant?" "When was your last menstrual period?"       N/a  Protocols used: Abdominal Pain - [facility]

## 2021-03-13 NOTE — Telephone Encounter (Signed)
Patient is calling to report she has been having abdominal pain- lower pelvic region- midline into back since yesterday. Patient states not getting better- advised per protocol- UC due to no opening in the office

## 2021-03-13 NOTE — Telephone Encounter (Signed)
FYI

## 2021-03-27 ENCOUNTER — Telehealth: Payer: Self-pay

## 2021-03-27 NOTE — Progress Notes (Signed)
APPOINTMENT REMINDER   Called Sharon Nelson, No answer, left message of appointment on 03/28/2021 at 9:00 am  via telephone visit with Junius Argyle , Pharm D. Notified to have all medications, supplements, blood pressure and blood sugar logs available during appointment and to return call if need to reschedule.    Care Gaps: Shingrix Vaccine Urine Microalbumin COVID-19 Vaccine Ophthalmology Exam Influenza Vaccine Star Rating Drugs: Ozempic 1 mg last filled on 02/20/2021 for 28 day supply at [facility].  Lovastatin 40 mg last filled on 10/23/2020 for 90 day supply at [facility].  Medication Fill Gaps: Gabapentin 600 MG last filled on 09/17/2020 for 90 day supply   [place] Pharmacist Assistant [phone]

## 2021-03-28 ENCOUNTER — Ambulatory Visit (INDEPENDENT_AMBULATORY_CARE_PROVIDER_SITE_OTHER): Payer: Medicare Other

## 2021-03-28 DIAGNOSIS — J3089 Other allergic rhinitis: Secondary | ICD-10-CM | POA: Diagnosis not present

## 2021-03-28 DIAGNOSIS — E1169 Type 2 diabetes mellitus with other specified complication: Secondary | ICD-10-CM

## 2021-03-28 DIAGNOSIS — E785 Hyperlipidemia, unspecified: Secondary | ICD-10-CM

## 2021-03-28 DIAGNOSIS — J301 Allergic rhinitis due to pollen: Secondary | ICD-10-CM | POA: Diagnosis not present

## 2021-03-28 MED ORDER — ROSUVASTATIN CALCIUM 5 MG PO TABS: 5.0000 mg | ORAL_TABLET | Freq: Every day | ORAL | 1 refills | Status: DC
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
## 2021-04-01 NOTE — Patient Instructions (Signed)
Visit Information It was great speaking with you today!  Please let me know if you have any questions about our visit.   Goals Addressed             This Visit's Progress    Monitor and Manage My Blood Sugar-Diabetes Type 2   On track    Timeframe:  Long-Range Goal Priority:  High Start Date:  09/27/2020                            Expected End Date:  03/28/2022                     Follow Up Date 06/27/2021     -check blood sugar at least 3 times daily - check blood sugar before and after exercise - check blood sugar if I feel it is too high or too low - take the blood sugar meter to all doctor visits    Why is this important?   Checking your blood sugar at home helps to keep it from getting very high or very low.  Writing the results in a diary or log helps the doctor know how to care for you.  Your blood sugar log should have the time, date and the results.  Also, write down the amount of insulin or other medicine that you take.  Other information, like what you ate, exercise done and how you were feeling, will also be helpful.     Notes:         Patient Care Plan: General Pharmacy (Adult)     Problem Identified: Hypertension, Hyperlipidemia, Diabetes and Osteopenia   Priority: High     Long-Range Goal: Patient-Specific Goal   Start Date: 09/27/2020  Expected End Date: 03/30/2021  This Visit's Progress: On track  Recent Progress: On track  Priority: High  Note:   Current Barriers:  Unable to independently afford treatment regimen  Pharmacist Clinical Goal(s):  Over the next 90 days, patient will verbalize ability to afford treatment regimen maintain control of diabetes as evidenced by A1c less than 8%  through collaboration with PharmD and provider.   Interventions: 1:1 collaboration with Birdie Sons, MD regarding development and update of comprehensive plan of care as evidenced by provider attestation and co-signature Inter-disciplinary care team collaboration  (see longitudinal plan of care) Comprehensive medication review performed; medication list updated in electronic medical record  Hypertension (BP goal <140/90) -Controlled -Current treatment: Furosemide 20 mg daily as needed Spironolactone 100 mg daily  -Medications previously tried: lisinopril   -Current home readings: NA -Denies hypotensive/hypertensive symptoms -Spironolactone dose potentially inappropriate for kidney function.  -Recheck BMP. Decrease spironolactone to 50 mg daily if kidney function not improved.   Hyperlipidemia: (LDL goal < 70) -Uncontrolled -Current treatment: Lovastatin 40 mg nightly  -Medications previously tried: Atorvastatin   -Patient missing multiple doses of lovastatin due to PM administration.  -STOP Lovastatin -START Rosuvastatin 5 mg every morning   Diabetes (A1c goal <8%) -Uncontrolled -Current medications: Ozempic 2 mg weekly  -Medications previously tried: Debria Garret, metformin, actos, januvia  -Current home glucose readings ranging 140-293.  -Denies hypoglycemic/hyperglycemic symptoms -Current meal patterns: eating high vegetable diet, but tends to eat plenty of high-starchy options (beans, corn, peas) -Current exercise: walking daily -Recommended to continue current medication  Osteopenia (Goal Maintain bone density and prevent fractures) -Controlled -Last DEXA Scan: 09/29/18   T-Score femoral neck: -1.4  T-Score total hip: -0.4  T-Score lumbar spine: +0.9  T-Score forearm radius: NA  10-year probability of major osteoporotic fracture: NA  10-year probability of hip fracture: NA -Patient is not a candidate for pharmacologic treatment -Current treatment  None -Medications previously tried: NA  -Recommend [phone] units of vitamin D daily. Recommend weight-bearing and muscle strengthening exercises for building and maintaining bone density. -Recommended to continue current medication -Recheck DEXA   Patient Goals/Self-Care  Activities Over the next 90 days, patient will:  - check glucose at least 3 times daily, document, and provide at future appointments check blood pressure weekly, document, and provide at future appointments  Follow Up Plan: Telephone follow up appointment with care management team member scheduled for:  06/27/2021 at 8:30 AM      Patient agreed to services and verbal consent obtained.   Patient verbalizes understanding of instructions provided today and agrees to view in [place].   Junius Argyle, PharmD, [place], [place] [phone]

## 2021-04-03 ENCOUNTER — Telehealth: Payer: Self-pay

## 2021-04-03 NOTE — Telephone Encounter (Signed)
Copied from [place] [id number]. Topic: Appointment Scheduling - Scheduling Inquiry for Clinic >> Apr 03, 2021 12:34 PM Celene Kras wrote: Reason for CRM: Pt called stating that she spoke with pharmacist and was advised that she would be getting a call from [facility] regarding a follow up appt. She states that she has not received a call yet and is requesting to have a call back. Please advise.

## 2021-04-04 DIAGNOSIS — J3089 Other allergic rhinitis: Secondary | ICD-10-CM | POA: Diagnosis not present

## 2021-04-04 DIAGNOSIS — J301 Allergic rhinitis due to pollen: Secondary | ICD-10-CM | POA: Diagnosis not present

## 2021-04-04 NOTE — Telephone Encounter (Signed)
Apologies, I forgot to reach out to you. She is due for a follow-up with her PCP and labwork. Can you help get her scheduled with Dr. Caryn Section?   Thanks!  Junius Argyle, PharmD, [facility] [place], [place] [phone]

## 2021-04-08 DIAGNOSIS — J301 Allergic rhinitis due to pollen: Secondary | ICD-10-CM | POA: Diagnosis not present

## 2021-04-08 DIAGNOSIS — J3089 Other allergic rhinitis: Secondary | ICD-10-CM | POA: Diagnosis not present

## 2021-04-09 ENCOUNTER — Other Ambulatory Visit: Payer: Self-pay | Admitting: Family Medicine

## 2021-04-09 DIAGNOSIS — G43019 Migraine without aura, intractable, without status migrainosus: Secondary | ICD-10-CM

## 2021-04-09 DIAGNOSIS — R413 Other amnesia: Secondary | ICD-10-CM

## 2021-04-09 DIAGNOSIS — G500 Trigeminal neuralgia: Secondary | ICD-10-CM

## 2021-04-09 DIAGNOSIS — M542 Cervicalgia: Secondary | ICD-10-CM

## 2021-04-09 NOTE — Telephone Encounter (Signed)
Requested medication (s) are due for refill today -expired Rx  Requested medication (s) are on the active medication list -yes  Future visit scheduled -yes  Last refill: 03/13/20 #270 2RF  Notes to clinic: Request RF: expired Rx  Requested Prescriptions  Pending Prescriptions Disp Refills   gabapentin (NEURONTIN) 600 MG tablet [Pharmacy Med Name: GABAPENTIN '600MG'$  TABLETS] 270 tablet 2    Sig: TAKE 1 TABLET BY MOUTH THREE TIMES DAILY     Neurology: Anticonvulsants - gabapentin Failed - 04/09/2021 10:43 AM      Failed - Valid encounter within last 12 months    Recent Outpatient Visits           5 months ago Essential (primary) hypertension   [facility] Birdie Sons, MD   1 year ago Type 2 diabetes mellitus with other specified complication, without long-term current use of insulin ([place])   [facility] Birdie Sons, MD   1 year ago Eczema, unspecified type   [place], Wendee Beavers, PA-C   1 year ago Shortness of breath   [employer] Flinchum, Kelby Aline, [place]   1 year ago Other postherpetic nervous system involvement   [place], Kirstie Peri, MD       Future Appointments             In 1 week Caryn Section, Kirstie Peri, MD [facility]            Signed Prescriptions Disp Refills   donepezil (ARICEPT) 10 MG tablet 90 tablet 0    Sig: TAKE 1 TABLET(10 MG) BY MOUTH DAILY     Neurology:  Alzheimer's Agents Failed - 04/09/2021 10:43 AM      Failed - Valid encounter within last 6 months    Recent Outpatient Visits           5 months ago Essential (primary) hypertension   [facility] Birdie Sons, MD   1 year ago Type 2 diabetes mellitus with other specified complication, without long-term current use of insulin ([place])   [facility] Birdie Sons, MD   1 year ago Eczema, unspecified type   [place], Wendee Beavers, PA-C   1 year ago Shortness of breath   [employer] Flinchum, Kelby Aline, FNP   1 year ago Other postherpetic nervous system involvement   [place], Kirstie Peri, MD       Future Appointments             In 1 week [place], Kirstie Peri, MD [facility], [facility]               Requested Prescriptions  Pending Prescriptions Disp Refills   gabapentin (NEURONTIN) 600 MG tablet [Pharmacy Med Name: GABAPENTIN '600MG'$  TABLETS] 270 tablet 2    Sig: TAKE 1 TABLET BY MOUTH THREE TIMES DAILY     Neurology: Anticonvulsants - gabapentin Failed - 04/09/2021 10:43 AM      Failed - Valid encounter within last 12 months    Recent Outpatient Visits           5 months ago Essential (primary) hypertension   [facility]ie Sons, [facility]   1 year ago Type 2 diabetes mellitus with other specified complication, without long-term current use of insulin [facility])   [facility] Birdie Sons, MD   1 year ago Eczema, unspecified type   [facility] Trinna Post, PA-C   1 year ago Shortness of breath   [facility] Flinchum, Kelby Aline, FNP   1 year ago Other postherpetic nervous system involvement   [place], Kirstie Peri, MD       Future Appointments             In 1 week Caryn Section, Kirstie Peri, MD [facility], [facility]            Signed Prescriptions Disp Refills   donepezil (ARICEPT) 10 MG tablet 90 tablet 0    Sig: TAKE 1 TABLET(10 MG) BY MOUTH DAILY     Neurology:  Alzheimer's Agents Failed - 04/09/2021 10:43 AM      Failed - Valid encounter within last 6 months    Recent Outpatient Visits           5 months ago Essential (primary) hypertension   [facility] Birdie Sons, MD   1 year ago Type 2 diabetes mellitus with other specified complication, without long-term current use of insulin [facility])   [facility]  Birdie Sons, MD   1 year ago Eczema, unspecified type   [place], Wendee Beavers, PA-C   1 year ago Shortness of breath   [employer] Flinchum, Kelby Aline, FNP   1 year ago Other postherpetic nervous system involvement   [facility] Birdie Sons, MD       Future Appointments             In 1 week Fisher, Kirstie Peri, MD [facility], [place]

## 2021-04-17 DIAGNOSIS — J301 Allergic rhinitis due to pollen: Secondary | ICD-10-CM | POA: Diagnosis not present

## 2021-04-17 DIAGNOSIS — J3089 Other allergic rhinitis: Secondary | ICD-10-CM | POA: Diagnosis not present

## 2021-04-21 ENCOUNTER — Ambulatory Visit (INDEPENDENT_AMBULATORY_CARE_PROVIDER_SITE_OTHER): Payer: Medicare Other | Admitting: Family Medicine

## 2021-04-21 ENCOUNTER — Other Ambulatory Visit: Payer: Self-pay

## 2021-04-21 VITALS — BP 135/59 | HR 65 | Resp 16 | Wt 149.0 lb

## 2021-04-21 DIAGNOSIS — Z23 Encounter for immunization: Secondary | ICD-10-CM

## 2021-04-21 DIAGNOSIS — I10 Essential (primary) hypertension: Secondary | ICD-10-CM

## 2021-04-21 NOTE — Progress Notes (Signed)
Established patient visit   Patient: Sharon Nelson   DOB: [birth year]   70 y.o. Female  MRN: [id number] Visit Date: 04/21/2021  Today's healthcare provider: [name], MD   No chief complaint on file.  Subjective    HPI  Diabetes Mellitus Type II, follow-up  Lab Results  Component Value Date   HGBA1C 8.1 10/09/2020   HGBA1C 8.4 04/25/2020   HGBA1C 7.0 (A) 12/12/2019   Last seen for diabetes 5 months ago.  Management since then includes continuing the same treatment. She reports excellent compliance with treatment. She is not having side effects.   Home blood sugar records: fasting range: 97-100  Episodes of hypoglycemia? No    Current insulin regiment: none Most Recent Eye Exam: 12/07/19  --------------------------------------------------------------------------------------------------- Hypertension, follow-up  BP Readings from Last 3 Encounters:  10/30/20 (!) 141/69  08/09/20 136/68  12/12/19 (!) 150/70   Wt Readings from Last 3 Encounters:  10/30/20 157 lb 6.4 oz (71.4 kg)  08/09/20 160 lb (72.6 kg)  12/12/19 159 lb (72.1 kg)     She was last seen for hypertension 5 months ago.  BP at that visit was 141/69. Management since that visit includes none. She reports good compliance with treatment. She is not having side effects.  She is not exercising. She is adherent to low salt diet.   Outside blood pressures are not being checked.  She does not smoke.  Use of agents associated with hypertension: NSAIDS.   --------------------------------------------------------------------------------------------------- Lipid/Cholesterol, follow-up  Last Lipid Panel: Lab Results  Component Value Date   CHOL 154 10/30/2020   LDLCALC 87 10/30/2020   HDL 41 10/30/2020   TRIG 149 10/30/2020    She was last seen for this 5 months ago.  Management since that visit includes none.  She reports excellent compliance with treatment. She is not having side  effects.   Symptoms: Yes appetite changes No foot ulcerations  No chest pain No chest pressure/discomfort  No dyspnea No orthopnea  No fatigue No lower extremity edema  No palpitations No paroxysmal nocturnal dyspnea  No nausea No numbness or tingling of extremity  No polydipsia No polyuria  No speech difficulty No syncope   She is following a Regular diet. Current exercise: not asked  Lab Results  Component Value Date   NA 142 10/30/2020   K 4.2 10/30/2020   CREATININE 1.39 (H) 10/30/2020   EGFR 41 (L) 10/30/2020   GFRNONAA >60 08/17/2019   GLUCOSE 127 (H) 10/30/2020     The 10-year ASCVD risk score (Arnett DK, et al., 2019) is: 19.4%  ---------------------------------------------------------------------------------------------------     Medications: Outpatient Medications Prior to Visit  Medication Sig   ACCU-CHEK AVIVA PLUS test strip USE TO TEST ONCE DAILY AS DIRECTED   ADVAIR HFA 115-21 MCG/ACT inhaler Inhale 2 puffs into the lungs 2 (two) times daily.    albuterol (VENTOLIN HFA) 108 (90 Base) MCG/ACT inhaler Inhale into the lungs every 4 (four) hours as needed.    AMITIZA 24 MCG capsule TAKE 1 CAPSULE BY MOUTH TWICE DAILY (Patient taking differently: Taking as needed)   aspirin 81 MG tablet Take 81 mg by mouth daily.    azelastine (OPTIVAR) 0.05 % ophthalmic solution 1 drop 2 (two) times daily.   BIOTIN PO Take by mouth daily. When remembers   Blood Glucose Monitoring Suppl (ACCU-CHEK AVIVA PLUS) w/Device KIT Use to check blood sugar for type 2 diabetes.   Cholecalciferol 2000 UNITS CAPS Take by mouth daily.  Continuous Blood Gluc Receiver (FREESTYLE LIBRE 2 READER) DEVI USE 1 DEVICE AS DIRECTED   Cyanocobalamin (VITAMIN B 12 PO) Take by mouth daily. When remembers   donepezil (ARICEPT) 10 MG tablet TAKE 1 TABLET(10 MG) BY MOUTH DAILY   EPINEPHrine 0.3 mg/0.3 mL IJ SOAJ injection    erythromycin ophthalmic ointment Apply to sutures 4 times a day for 10-12 days.   Discontinue if allergy develops and call our office   estradiol (ESTRACE) 0.5 MG tablet Take 1 tablet (0.5 mg total) by mouth every other day.   furosemide (LASIX) 20 MG tablet Take 1 tablet (20 mg total) by mouth daily as needed for edema.   gabapentin (NEURONTIN) 600 MG tablet TAKE 1 TABLET BY MOUTH THREE TIMES DAILY   levocetirizine (XYZAL) 5 MG tablet Take 5 mg by mouth every evening.    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 REMOVE AND DISCARD AFTER 12 HOURS OR AS DIRECTED BY DOCTOR   mometasone (ELOCON) 0.1 % cream    rosuvastatin (CRESTOR) 5 MG tablet Take 1 tablet (5 mg total) by mouth daily. Replaces Lovastatin   Semaglutide, 1 MG/DOSE, 2 MG/1.5ML SOPN Inject 2 mg into the skin once a week. Mondays   spironolactone (ALDACTONE) 100 MG tablet Take 1 tablet (100 mg total) by mouth daily.   traMADol (ULTRAM) 50 MG tablet Take 1 every 4-6 hours as needed for pain not controlled by Tylenol   triamcinolone cream (KENALOG) 0.1 % Apply 1 application topically 2 (two) times daily.   No facility-administered medications prior to visit.         Objective    BP (!) 135/59   Pulse 65   Resp 16   Wt 149 lb (67.6 kg)   SpO2 99%   BMI 29.59 kg/m  {Show previous vital signs (optional):23777}  Physical Exam   General: Appearance:     Well developed, well nourished female in no acute distress  Eyes:    PERRL, conjunctiva/corneas clear, EOM's intact       Lungs:     Clear to auscultation bilaterally, respirations unlabored  Heart:    Normal heart rate. Normal rhythm.  2/6   MS:   All extremities are intact.    Neurologic:   Awake, alert, oriented x 3. No apparent focal neurological defect.         Assessment & Plan     1. Essential (primary) hypertension Well controlled.  Continue current medications.  eGFR noted to have dropped with last labs.  - Renal function panel  2. Need for influenza vaccination  - Flu Vaccine QUAD High  Dose IM (Fluad)   Future Appointments  Date Time Provider [place]  06/27/2021  8:30 AM [facility] PHARMACIST [facility]  09/17/2021 10:00 AM Casilda Pickerill, Kirstie Peri, MD [facility]         The entirety of the information documented in the History of Present Illness, Review of Systems and Physical Exam were personally obtained by me. Portions of this information were initially documented by the CMA and reviewed by me for thoroughness and accuracy.     [name], MD  [facility] [phone] (phone) [phone] (fax)  [place]

## 2021-04-21 NOTE — Patient Instructions (Addendum)
Please review the attached list of medications and notify my office if there are any errors.  ? ?Please call the [facility] ([phone]) to schedule a routine screening mammogram. ? ?

## 2021-04-22 DIAGNOSIS — J3089 Other allergic rhinitis: Secondary | ICD-10-CM | POA: Diagnosis not present

## 2021-04-22 DIAGNOSIS — J301 Allergic rhinitis due to pollen: Secondary | ICD-10-CM | POA: Diagnosis not present

## 2021-04-22 LAB — RENAL FUNCTION PANEL
Albumin: 4.5 g/dL (ref 3.7–4.7)
BUN/Creatinine Ratio: 10 — ABNORMAL LOW (ref 12–28)
BUN: 13 mg/dL (ref 8–27)
CO2: 16 mmol/L — ABNORMAL LOW (ref 20–29)
Calcium: 9.8 mg/dL (ref 8.7–10.3)
Chloride: 105 mmol/L (ref 96–106)
Creatinine, Ser: 1.24 mg/dL — ABNORMAL HIGH (ref 0.57–1.00)
Glucose: 90 mg/dL (ref 70–99)
Phosphorus: 3.3 mg/dL (ref 3.0–4.3)
Potassium: 4.7 mmol/L (ref 3.5–5.2)
Sodium: 145 mmol/L — ABNORMAL HIGH (ref 134–144)
eGFR: 46 mL/min/{1.73_m2} — ABNORMAL LOW (ref >59)

## 2021-04-25 ENCOUNTER — Telehealth: Payer: Self-pay | Admitting: Family Medicine

## 2021-04-25 DIAGNOSIS — E1169 Type 2 diabetes mellitus with other specified complication: Secondary | ICD-10-CM

## 2021-04-25 DIAGNOSIS — E785 Hyperlipidemia, unspecified: Secondary | ICD-10-CM | POA: Diagnosis not present

## 2021-04-25 DIAGNOSIS — G43019 Migraine without aura, intractable, without status migrainosus: Secondary | ICD-10-CM

## 2021-04-25 DIAGNOSIS — M542 Cervicalgia: Secondary | ICD-10-CM

## 2021-04-25 DIAGNOSIS — G500 Trigeminal neuralgia: Secondary | ICD-10-CM

## 2021-04-25 NOTE — Telephone Encounter (Signed)
Pt called stating that she has recently been starting to have her migraines come back. She states that usually the migraines are fixed wit her gabapentin and tylenol, but that recently that has not been helping. She is requesting to have advice or medication sent in for her to help with this. Please advise.      [facility] [place], [place] AT [facility]  [address] [place] [address]  Phone: [phone] Fax: [phone]  Hours: Not open 24 hours

## 2021-04-25 NOTE — Telephone Encounter (Signed)
Any other recommendations? Please advise. Thanks!

## 2021-04-30 MED ORDER — GABAPENTIN 800 MG PO TABS: 800.0000 mg | ORAL_TABLET | Freq: Three times a day (TID) | ORAL | 1 refills | Status: DC

## 2021-04-30 NOTE — Telephone Encounter (Signed)
Patient advise. She would like to try the higher dose of Gabapentin. Prescription sent into pharmacy.

## 2021-04-30 NOTE — Telephone Encounter (Signed)
Can try increasing gabapentin to 800mg  TID, can send prescription for #90 tablets and 1 refill to her pharmacy.

## 2021-05-01 DIAGNOSIS — J301 Allergic rhinitis due to pollen: Secondary | ICD-10-CM | POA: Diagnosis not present

## 2021-05-01 DIAGNOSIS — J3089 Other allergic rhinitis: Secondary | ICD-10-CM | POA: Diagnosis not present

## 2021-05-05 ENCOUNTER — Telehealth: Payer: Self-pay

## 2021-05-05 NOTE — Progress Notes (Signed)
Chronic Care Management Pharmacy Assistant   Name: Sharon Nelson  MRN: [id number] DOB: [birth year]  Reason for [place] Call.   Recent office visits:  04/25/2021 SharonFisher MD (PCP) increase gabapentin to 852m TID 04/21/2021 SharonFisher MD (PCP) No medication Changes noted  Recent consult visits:  [facility]  None in previous 6 months  Medications: Outpatient Encounter Medications as of 05/05/2021  Medication Sig Note   ACCU-CHEK AVIVA PLUS test strip USE TO TEST ONCE DAILY AS DIRECTED    ADVAIR HFA 115-21 MCG/ACT inhaler Inhale 2 puffs into the lungs 2 (two) times daily.     albuterol (VENTOLIN HFA) 108 (90 Base) MCG/ACT inhaler Inhale into the lungs every 4 (four) hours as needed.  01/25/2015: Received from: [id number] MCG capsule TAKE 1 CAPSULE BY MOUTH TWICE DAILY (Patient taking differently: Taking as needed)    aspirin 81 MG tablet Take 81 mg by mouth daily.  01/25/2015: [place]ceived from: [place]  azelastine (OPTIVAR) 0.05 % ophthalmic solution 1 drop 2 (two) times daily.    BIOTIN PO Take by mouth daily. When remembers    Blood Glucose Monitoring Suppl (ACCU-CHEK AVIVA PLUS) w/Device KIT Use to check blood sugar for type 2 diabetes.    Cholecalciferol 2000 UNITS CAPS Take by mouth daily.  01/25/2015: [place]ceived from: [place]  Continuous Blood Gluc Receiver (FREESTYLE LIBRE 2 READER) DEVI USE 1 DEVICE AS DIRECTED    Cyanocobalamin (VITAMIN B 12 PO) Take by mouth daily. When remembers    donepezil (ARICEPT) 10 MG tablet TAKE 1 TABLET(10 MG) BY MOUTH DAILY    EPINEPHrine 0.3 mg/0.3 mL IJ SOAJ injection  02/05/2016: PRN    erythromycin ophthalmic ointment Apply to sutures 4 times a day for 10-12 days.  Discontinue if allergy develops and call our office    estradiol (ESTRACE) 0.5 MG tablet Take 1 tablet (0.5 mg total) by mouth every other day.     furosemide (LASIX) 20 MG tablet Take 1 tablet (20 mg total) by mouth daily as needed for edema.    gabapentin (NEURONTIN) 800 MG tablet Take 1 tablet (800 mg total) by mouth 3 (three) times daily.    levocetirizine (XYZAL) 5 MG tablet Take 5 mg by mouth every evening.  01/25/2015: [place]: [place]  lidocaine (LIDODERM) 5 % PLACE 1 PATCH ONTO SKIN EVERY DAY FOR POST HERPETIC NEURALGIA. REMOVE AND DISCARD AFTER 12 HOURS OR AS DIRECTED BY DOCTOR    mometasone (ELOCON) 0.1 % cream     rosuvastatin (CRESTOR) 5 MG tablet Take 1 tablet (5 mg total) by mouth daily. Replaces Lovastatin    Semaglutide, 1 MG/DOSE, 2 MG/1.5ML SOPN Inject 2 mg into the skin once a week. Mondays 09/19/2020: Prescribed by Sharon Nelson   spironolactone (ALDACTONE) 100 MG tablet Take 1 tablet (100 mg total) by mouth daily.    traMADol (ULTRAM) 50 MG tablet Take 1 every 4-6 hours as needed for pain not controlled by Tylenol    triamcinolone cream (KENALOG) 0.1 % Apply 1 application topically 2 (two) times daily.    No facility-administered encounter medications on file as of 05/05/2021.    Care Gaps: Shingrix Vaccine Urine Microalbumin (Last Completed 05/15/2019) Covid-19 Vaccine (4- [facility] Ophthalmology Exam (Last Completed 12/25/2019) Foot Exam (Last Completed 04/25/2020) Star Rating Drugs: Ozempic 1 mg last filled on 04/08/2021 for 28 day  supply at [facility].  Rosuvastatin 5 mg last filled on 03/28/2021 for 90 day supply at [facility].    Medication Fill Gaps: None  05/05/2021 Name: Sharon Nelson MRN: [id number] DOB: 10-07-1950 Sharon Nelson is a 70 y.o. year old female who is a primary care patient of Sharon Nelson, Sharon Peri, MD.  Comprehensive medication review performed; Spoke to patient regarding cholesterol  Lipid Panel    Component Value Date/Time   CHOL 154 10/30/2020 0954   TRIG 149 10/30/2020 0954   HDL 41 10/30/2020 0954   LDLCALC 87 10/30/2020  0954    10-year ASCVD risk score: The 10-year ASCVD risk score (Arnett DK, et al., 2019) is: 24%   Values used to calculate the score:     Age: 70 years     Sex: Female     Is Non-Hispanic African American: Yes     Diabetic: Yes     Tobacco smoker: No     Systolic Blood Pressure: 468 mmHg     Is BP treated: Yes     HDL Cholesterol: 41 mg/dL     Total Cholesterol: 154 mg/dL  Current antihyperlipidemic regimen:  Rosuvastatin 5 mg 1 tablet daily Previous antihyperlipidemic medications tried: Atorvastatin, Lovastatin ASCVD risk enhancing conditions: age >79, DM, and HTN What recent interventions/DTPs have been made by any provider to improve Cholesterol control since last CPP Visit:  03/28/2021 Sharon Nelson [facility] ([facility]) Stop Lovastatin and start Rosuvastatin 5 mg every Morning  Any recent hospitalizations or ED visits since last visit with CPP? No  I have attempted without success to contact this patient by phone three times to do her Hyperlipidemia Disease State call. I left a Voice message for patient to return my call.LVM 10/12,10/13,10/17  Adherence Review: Does the patient have >5 day gap between last estimated fill dates? No   Sharon Nelson Clinical Production designer, theatre/television/film [phone]

## 2021-05-09 DIAGNOSIS — J301 Allergic rhinitis due to pollen: Secondary | ICD-10-CM | POA: Diagnosis not present

## 2021-05-09 DIAGNOSIS — J3089 Other allergic rhinitis: Secondary | ICD-10-CM | POA: Diagnosis not present

## 2021-05-13 DIAGNOSIS — J301 Allergic rhinitis due to pollen: Secondary | ICD-10-CM | POA: Diagnosis not present

## 2021-05-13 DIAGNOSIS — J3089 Other allergic rhinitis: Secondary | ICD-10-CM | POA: Diagnosis not present

## 2021-05-14 ENCOUNTER — Telehealth: Payer: Self-pay

## 2021-05-14 NOTE — Telephone Encounter (Signed)
Copied from [place] [id number]. Topic: General - Other >> May 14, 2021  4:59 PM Antonieta Iba C wrote: Reason for CRM: pt called in for assistance. Pt says that she was prescribed ozempic (not showing in chart) pt says that she was told by her pharmacy that the medication is out of stock. Pt would like further assistance.

## 2021-05-19 NOTE — Telephone Encounter (Signed)
Pt called back to follow up on her previous request, please advise.

## 2021-05-20 DIAGNOSIS — J301 Allergic rhinitis due to pollen: Secondary | ICD-10-CM | POA: Diagnosis not present

## 2021-05-20 DIAGNOSIS — J3089 Other allergic rhinitis: Secondary | ICD-10-CM | POA: Diagnosis not present

## 2021-05-20 NOTE — Telephone Encounter (Signed)
LMTCB 05/20/2021.  PEC please advise pt as below when she calls back.   Thanks,   -Mickel Baas

## 2021-05-20 NOTE — Telephone Encounter (Signed)
The only alternative is to go back on Trulicity. If she want's to try Trulicity again then I can send prescription, otherwise she'll need to wait until Ozempic production is caught up.

## 2021-05-27 ENCOUNTER — Telehealth: Payer: Self-pay

## 2021-05-27 NOTE — Progress Notes (Signed)
Chronic Care Management Pharmacy Assistant   Name: Sharon Nelson  MRN: [id number] DOB: [birth year]  Reason for Encounter: Diabetes Disease State Call.   Recent office visits:  No recent office visit  Recent consult visits:  No recent consult visit  Hospital visits:  None in previous 6 months  Medications: Outpatient Encounter Medications as of 05/27/2021  Medication Sig Note   ACCU-CHEK AVIVA PLUS test strip USE TO TEST ONCE DAILY AS DIRECTED    ADVAIR HFA 115-21 MCG/ACT inhaler Inhale 2 puffs into the lungs 2 (two) times daily.     albuterol (VENTOLIN HFA) 108 (90 Base) MCG/ACT inhaler Inhale into the lungs every 4 (four) hours as needed.  01/25/2015: Received from: [place] 24 MCG capsule TAKE 1 CAPSULE BY MOUTH TWICE DAILY (Patient taking differently: Taking as needed)    aspirin 81 MG tablet Take 81 mg by mouth daily.  01/25/2015: Received from: [place]   azelastine (OPTIVAR) 0.05 % ophthalmic solution 1 drop 2 (two) times daily.    BIOTIN PO Take by mouth daily. When remembers    Blood Glucose Monitoring Suppl (ACCU-CHEK AVIVA PLUS) w/Device KIT Use to check blood sugar for type 2 diabetes.    Cholecalciferol 2000 UNITS CAPS Take by mouth daily.  01/25/2015: Received from: [place]   Continuous Blood Gluc Receiver (FREESTYLE LIBRE 2 READER) DEVI USE 1 DEVICE AS DIRECTED    Cyanocobalamin (VITAMIN B 12 PO) Take by mouth daily. When remembers    donepezil (ARICEPT) 10 MG tablet TAKE 1 TABLET(10 MG) BY MOUTH DAILY    EPINEPHrine 0.3 mg/0.3 mL IJ SOAJ injection  02/05/2016: PRN    erythromycin ophthalmic ointment Apply to sutures 4 times a day for 10-12 days.  Discontinue if allergy develops and call our office    estradiol (ESTRACE) 0.5 MG tablet Take 1 tablet (0.5 mg total) by mouth every other day.    furosemide (LASIX) 20 MG tablet Take 1 tablet (20 mg total) by mouth daily as needed for edema.    gabapentin  (NEURONTIN) 800 MG tablet Take 1 tablet (800 mg total) by mouth 3 (three) times daily.    levocetirizine (XYZAL) 5 MG tablet Take 5 mg by mouth every evening.  01/25/2015: Received from: [place]   lidocaine (LIDODERM) 5 % PLACE 1 PATCH ONTO SKIN EVERY DAY FOR POST HERPETIC NEURALGIA. REMOVE AND DISCARD AFTER 12 HOURS OR AS DIRECTED BY DOCTOR    mometasone (ELOCON) 0.1 % cream     rosuvastatin (CRESTOR) 5 MG tablet Take 1 tablet (5 mg total) by mouth daily. Replaces Lovastatin    Semaglutide, 1 MG/DOSE, 2 MG/1.5ML SOPN Inject 2 mg into the skin once a week. Mondays 09/19/2020: Prescribed by Dr. Honor Junes    spironolactone (ALDACTONE) 100 MG tablet Take 1 tablet (100 mg total) by mouth daily.    traMADol (ULTRAM) 50 MG tablet Take 1 every 4-6 hours as needed for pain not controlled by Tylenol    triamcinolone cream (KENALOG) 0.1 % Apply 1 application topically 2 (two) times daily.    No facility-administered encounter medications on file as of 05/27/2021.    Care Gaps: Shingrix Vaccine Urine Microalbumin (Last Completed 05/15/2019) Covid-19 Vaccine (4- Booster for Moderna series) Ophthalmology Exam (Last Completed 12/25/2019) Foot Exam (Last Completed 04/25/2020) Tetanus Vaccine (Last Completed 05/11/2011) Star Rating Drugs: Ozempic 1 mg last filled on 04/08/2021 for 28 day supply at [facility].  Rosuvastatin 5  mg last filled on 03/28/2021 for 90 day supply at [facility].    Medication Fill Gaps: None  Recent Relevant Labs: Lab Results  Component Value Date/Time   HGBA1C 7.4 02/12/2021 12:00 AM   HGBA1C 8.1 10/09/2020 12:00 AM   MICROALBUR negative 05/15/2019 09:14 AM   MICROALBUR 20 02/23/2018 10:00 AM    Kidney Function Lab Results  Component Value Date/Time   CREATININE 1.24 (H) 04/21/2021 09:44 AM   CREATININE 1.39 (H) 10/30/2020 09:54 AM   CREATININE 1.04 02/12/2014 03:53 AM   CREATININE 0.98 02/11/2014 04:21 AM   GFRNONAA >60 08/17/2019  10:49 AM   GFRNONAA 56 (L) 02/12/2014 03:53 AM   GFRAA >60 08/17/2019 10:49 AM   GFRAA >60 02/12/2014 03:53 AM    Current antihyperglycemic regimen:  Ozempic 2 mg weekly  What recent interventions/DTPs have been made to improve glycemic control:  Patient states she was unable to take her Ozempic for two almost three weeks because it was on back order.Patient reports her endocrinology gave her samples, and she gave her first injection yesterday 05/27/2021. Have there been any recent hospitalizations or ED visits since last visit with CPP? No Patient denies hypoglycemic symptoms, including Pale, Sweaty, Shaky, Hungry, Nervous/irritable, and Vision changes Patient denies hyperglycemic symptoms, including blurry vision, excessive thirst, fatigue, polyuria, and weakness How often are you checking your blood sugar?  Patient reports she has a libre device,but she still having issue connecting it to her computer to upload her readings.Inform patient if she would like to make a office visit with the clinical pharmacist to show her how to set her libre device up since we was unable to over the phone.Patient declined at this moment because she has busy schedule with work, but will discuss it with the clinical pharmacist at her telephone appointment. What are your blood sugars ranging?  None ID During the week, how often does your blood glucose drop below 70?  Patient has a libre device, and has not check her blood sugar since she has not been taking her ozempic.  Are you checking your feet daily/regularly?   Patient reports some numbness in both feet.   Update on PAP for ozempic 2 mg weekly:   Patient states she did receive her PAP for  Ozempic in the mail, but is unsure where it is.Inform patient I could redo her PAP for Ozempic, and she can have it mail or she could pick it up at the front desk at her PCP office.Patient states she would be very thankful, and she would pick it up tomorrow on  05/29/2021.Patient is aware to completed her part of the application, include a copy of her income, and return the application to her Endocrinology so the provider can complete his part and fax it over to [employer] for processing.Completed the Ozempic patient assistance application and sent to clinical pharmacist for review.Notified Clinical Pharmacist.  Tolerating new cholesterol start: Patient states she is taking Rosuvastatin 5 mg, and she thinks she is doing good so far.Patient reports having a upset stomach, but she believe it is from her IBS not Rosuvastatin.   Adherence Review: Is the patient currently on a STATIN medication? Yes Is the patient currently on ACE/ARB medication? No Does the patient have >5 day gap between last estimated fill dates? No  Telephone follow up appointment with Care management team member scheduled for : 06/27/2021 at 8:30 am.  Patient states she would like to reschedule her appointment because of her work schedule.  Telephone follow up appointment with Care management team member scheduled for : 07/07/2021 at 3:45 pm.  [place] Pharmacist Assistant [phone]

## 2021-05-29 DIAGNOSIS — J301 Allergic rhinitis due to pollen: Secondary | ICD-10-CM | POA: Diagnosis not present

## 2021-05-29 DIAGNOSIS — J3089 Other allergic rhinitis: Secondary | ICD-10-CM | POA: Diagnosis not present

## 2021-06-03 DIAGNOSIS — J301 Allergic rhinitis due to pollen: Secondary | ICD-10-CM | POA: Diagnosis not present

## 2021-06-03 DIAGNOSIS — J3089 Other allergic rhinitis: Secondary | ICD-10-CM | POA: Diagnosis not present

## 2021-06-13 DIAGNOSIS — J301 Allergic rhinitis due to pollen: Secondary | ICD-10-CM | POA: Diagnosis not present

## 2021-06-13 DIAGNOSIS — J3081 Allergic rhinitis due to animal (cat) (dog) hair and dander: Secondary | ICD-10-CM | POA: Diagnosis not present

## 2021-06-13 DIAGNOSIS — J3089 Other allergic rhinitis: Secondary | ICD-10-CM | POA: Diagnosis not present

## 2021-06-17 DIAGNOSIS — J301 Allergic rhinitis due to pollen: Secondary | ICD-10-CM | POA: Diagnosis not present

## 2021-06-17 DIAGNOSIS — J3089 Other allergic rhinitis: Secondary | ICD-10-CM | POA: Diagnosis not present

## 2021-06-18 DIAGNOSIS — E1169 Type 2 diabetes mellitus with other specified complication: Secondary | ICD-10-CM | POA: Diagnosis not present

## 2021-06-18 DIAGNOSIS — E1165 Type 2 diabetes mellitus with hyperglycemia: Secondary | ICD-10-CM | POA: Diagnosis not present

## 2021-06-18 DIAGNOSIS — E785 Hyperlipidemia, unspecified: Secondary | ICD-10-CM | POA: Diagnosis not present

## 2021-06-18 DIAGNOSIS — I152 Hypertension secondary to endocrine disorders: Secondary | ICD-10-CM | POA: Diagnosis not present

## 2021-06-18 DIAGNOSIS — E1159 Type 2 diabetes mellitus with other circulatory complications: Secondary | ICD-10-CM | POA: Diagnosis not present

## 2021-06-18 LAB — HEMOGLOBIN A1C: Hemoglobin A1C: 7.3

## 2021-06-23 ENCOUNTER — Other Ambulatory Visit: Payer: Self-pay | Admitting: Family Medicine

## 2021-06-23 NOTE — Telephone Encounter (Signed)
Medication: Semaglutide, 1 MG/DOSE, 2 MG/1.5ML SOPN [[id number]]   Has the patient contacted their pharmacy? YES  (Agent: If no, request that the patient contact the pharmacy for the refill. If patient does not wish to contact the pharmacy document the reason why and proceed with request.) (Agent: If yes, when and what did the pharmacy advise?)  Preferred Pharmacy (with phone number or street name): [facility], [address] [facility] [place] [address] Phone: [phone] Fax: [phone] Hours: Not open 24 hours   Has the patient been seen for an appointment in the last year OR does the patient have an upcoming appointment? YES 04/21/21  Agent: Please be advised that RX refills may take up to 3 business days. We ask that you follow-up with your pharmacy.

## 2021-06-24 DIAGNOSIS — J301 Allergic rhinitis due to pollen: Secondary | ICD-10-CM | POA: Diagnosis not present

## 2021-06-24 DIAGNOSIS — J3089 Other allergic rhinitis: Secondary | ICD-10-CM | POA: Diagnosis not present

## 2021-06-24 NOTE — Telephone Encounter (Signed)
Requested medications are due for refill today.  unsure  Requested medications are on the active medications list.  yes  Last refill. 07/31/2020  Future visit scheduled.   no  Notes to clinic.  Rx by historical provider.

## 2021-06-24 NOTE — Telephone Encounter (Signed)
Requested medications are due for refill today.  unsure  Requested medications are on the active medications list.  yes  Last refill. 07/31/2020  Future visit scheduled.   no  Notes to clinic.  Rx by a historical provider.

## 2021-06-25 NOTE — Telephone Encounter (Signed)
She needs to contact Dr. Honor Junes for refill.

## 2021-06-25 NOTE — Telephone Encounter (Signed)
Do you fill this?  Pt sees an endocrinologist.    Thanks,   Mickel Baas

## 2021-06-27 ENCOUNTER — Telehealth: Payer: BC Managed Care – PPO

## 2021-07-01 DIAGNOSIS — J301 Allergic rhinitis due to pollen: Secondary | ICD-10-CM | POA: Diagnosis not present

## 2021-07-01 DIAGNOSIS — J3089 Other allergic rhinitis: Secondary | ICD-10-CM | POA: Diagnosis not present

## 2021-07-04 ENCOUNTER — Telehealth: Payer: Self-pay

## 2021-07-04 NOTE — Progress Notes (Signed)
APPOINTMENT REMINDER   Called Sharon Nelson, No answer, left message of appointment on 07/07/2021 at 3:45 pm via telephone visit with Junius Argyle , Pharm D. Notified to have all medications, supplements, blood pressure and/or blood sugar logs available during appointment and to return call if need to reschedule.     Patient assistance update for Ozempic:  Per [employer], they do not have any application for this patient.

## 2021-07-07 ENCOUNTER — Telehealth: Payer: BC Managed Care – PPO
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
## 2021-07-08 DIAGNOSIS — J3089 Other allergic rhinitis: Secondary | ICD-10-CM | POA: Diagnosis not present

## 2021-07-08 DIAGNOSIS — J301 Allergic rhinitis due to pollen: Secondary | ICD-10-CM | POA: Diagnosis not present

## 2021-07-11 DIAGNOSIS — J301 Allergic rhinitis due to pollen: Secondary | ICD-10-CM | POA: Diagnosis not present

## 2021-07-11 DIAGNOSIS — J3089 Other allergic rhinitis: Secondary | ICD-10-CM | POA: Diagnosis not present

## 2021-07-11 DIAGNOSIS — J3081 Allergic rhinitis due to animal (cat) (dog) hair and dander: Secondary | ICD-10-CM | POA: Diagnosis not present

## 2021-07-15 ENCOUNTER — Other Ambulatory Visit: Payer: Self-pay | Admitting: Family Medicine

## 2021-07-15 DIAGNOSIS — R413 Other amnesia: Secondary | ICD-10-CM

## 2021-07-22 DIAGNOSIS — J301 Allergic rhinitis due to pollen: Secondary | ICD-10-CM | POA: Diagnosis not present

## 2021-07-22 DIAGNOSIS — J3089 Other allergic rhinitis: Secondary | ICD-10-CM | POA: Diagnosis not present

## 2021-07-24 DIAGNOSIS — L2089 Other atopic dermatitis: Secondary | ICD-10-CM | POA: Diagnosis not present

## 2021-07-24 DIAGNOSIS — J301 Allergic rhinitis due to pollen: Secondary | ICD-10-CM | POA: Diagnosis not present

## 2021-07-24 DIAGNOSIS — J4530 Mild persistent asthma, uncomplicated: Secondary | ICD-10-CM | POA: Diagnosis not present

## 2021-07-24 DIAGNOSIS — L501 Idiopathic urticaria: Secondary | ICD-10-CM | POA: Diagnosis not present

## 2021-07-24 DIAGNOSIS — J3081 Allergic rhinitis due to animal (cat) (dog) hair and dander: Secondary | ICD-10-CM | POA: Diagnosis not present

## 2021-07-24 DIAGNOSIS — J3089 Other allergic rhinitis: Secondary | ICD-10-CM | POA: Diagnosis not present

## 2021-08-01 DIAGNOSIS — J3089 Other allergic rhinitis: Secondary | ICD-10-CM | POA: Diagnosis not present

## 2021-08-01 DIAGNOSIS — J301 Allergic rhinitis due to pollen: Secondary | ICD-10-CM | POA: Diagnosis not present

## 2021-08-01 DIAGNOSIS — J3081 Allergic rhinitis due to animal (cat) (dog) hair and dander: Secondary | ICD-10-CM | POA: Diagnosis not present

## 2021-08-05 DIAGNOSIS — J301 Allergic rhinitis due to pollen: Secondary | ICD-10-CM | POA: Diagnosis not present

## 2021-08-05 DIAGNOSIS — J3081 Allergic rhinitis due to animal (cat) (dog) hair and dander: Secondary | ICD-10-CM | POA: Diagnosis not present

## 2021-08-05 DIAGNOSIS — J3089 Other allergic rhinitis: Secondary | ICD-10-CM | POA: Diagnosis not present

## 2021-08-12 ENCOUNTER — Other Ambulatory Visit: Payer: Self-pay | Admitting: Family Medicine

## 2021-08-12 DIAGNOSIS — E785 Hyperlipidemia, unspecified: Secondary | ICD-10-CM

## 2021-08-12 DIAGNOSIS — E1169 Type 2 diabetes mellitus with other specified complication: Secondary | ICD-10-CM

## 2021-08-12 NOTE — Telephone Encounter (Signed)
Requested Prescriptions  Pending Prescriptions Disp Refills   furosemide (LASIX) 20 MG tablet [Pharmacy Med Name: FUROSEMIDE 20MG  TABLETS] 90 tablet 0    Sig: TAKE 1 TABLET(20 MG) BY MOUTH DAILY AS NEEDED FOR SWELLING     Cardiovascular:  Diuretics - Loop Failed - 08/12/2021  9:25 AM      Failed - Na in normal range and within 360 days    Sodium  Date Value Ref Range Status  04/21/2021 145 (H) 134 - 144 mmol/L Final  02/12/2014 142 136 - 145 mmol/L Final         Failed - Cr in normal range and within 360 days    Creatinine  Date Value Ref Range Status  02/12/2014 1.04 0.60 - 1.30 mg/dL Final   Creatinine, Ser  Date Value Ref Range Status  04/21/2021 1.24 (H) 0.57 - 1.00 mg/dL Final   Creatinine, POC  Date Value Ref Range Status  05/15/2019 n/a mg/dL Final         Passed - K in normal range and within 360 days    Potassium  Date Value Ref Range Status  04/21/2021 4.7 3.5 - 5.2 mmol/L Final  02/12/2014 4.4 3.5 - 5.1 mmol/L Final         Passed - Ca in normal range and within 360 days    Calcium  Date Value Ref Range Status  04/21/2021 9.8 8.7 - 10.3 mg/dL Final   Calcium, Total  Date Value Ref Range Status  02/12/2014 8.0 (L) 8.5 - 10.1 mg/dL Final         Passed - Last BP in normal range    BP Readings from Last 1 Encounters:  04/21/21 (!) 135/59         Passed - Valid encounter within last 6 months    Recent Outpatient Visits          3 months ago Essential (primary) hypertension   [facility] Birdie Sons, MD   9 months ago Essential (primary) hypertension   [facility], MD   1 year ago Type 2 diabetes mellitus with other specified complication, without long-term current use of insulin [facility])   [facility] Birdie Sons, MD   1 year ago Eczema, unspecified type   [place], Wendee Beavers, PA-C   1 year ago Shortness of breath   [facility],  Kelby Aline, FNP              rosuvastatin (CRESTOR) 5 MG tablet [Pharmacy Med Name: ROSUVASTATIN 5MG  TABLETS] 90 tablet 1    Sig: TAKE 1 TABLET(5 MG) BY MOUTH DAILY. REPLACES LOVASTATIN     Cardiovascular:  Antilipid - Statins Passed - 08/12/2021  9:25 AM      Passed - Total Cholesterol in normal range and within 360 days    Cholesterol, Total  Date Value Ref Range Status  10/30/2020 154 100 - 199 mg/dL Final         Passed - LDL in normal range and within 360 days    LDL Chol Calc (NIH)  Date Value Ref Range Status  10/30/2020 87 0 - 99 mg/dL Final         Passed - HDL in normal range and within 360 days    HDL  Date Value Ref Range Status  10/30/2020 41 >39 mg/dL Final         Passed - Triglycerides in normal range and within 360 days  Triglycerides  Date Value Ref Range Status  10/30/2020 149 0 - 149 mg/dL Final         Passed - Patient is not pregnant      Passed - Valid encounter within last 12 months    Recent Outpatient Visits          3 months ago Essential (primary) hypertension   [facility] Birdie Sons, MD   9 months ago Essential (primary) hypertension   [facility], MD   1 year ago Type 2 diabetes mellitus with other specified complication, without long-term current use of insulin [facility])   [facility] Birdie Sons, MD   1 year ago Eczema, unspecified type   [place], Wendee Beavers, PA-C   1 year ago Shortness of breath   [facility] [place], Kelby Aline, [place]

## 2021-08-14 DIAGNOSIS — J3089 Other allergic rhinitis: Secondary | ICD-10-CM | POA: Diagnosis not present

## 2021-08-14 DIAGNOSIS — J301 Allergic rhinitis due to pollen: Secondary | ICD-10-CM | POA: Diagnosis not present

## 2021-08-19 DIAGNOSIS — J301 Allergic rhinitis due to pollen: Secondary | ICD-10-CM | POA: Diagnosis not present

## 2021-08-19 DIAGNOSIS — J3089 Other allergic rhinitis: Secondary | ICD-10-CM | POA: Diagnosis not present

## 2021-08-26 DIAGNOSIS — J3089 Other allergic rhinitis: Secondary | ICD-10-CM | POA: Diagnosis not present

## 2021-08-26 DIAGNOSIS — J301 Allergic rhinitis due to pollen: Secondary | ICD-10-CM | POA: Diagnosis not present

## 2021-08-27 ENCOUNTER — Telehealth: Payer: Self-pay

## 2021-08-27 NOTE — Progress Notes (Signed)
Chronic Care Management Pharmacy Assistant   Name: Sharon Nelson  MRN: [id number] DOB: [birth year]  Reason for Encounter:Diabetes Disease State Call.   Recent office visits:  08/29/2021 [name] FNP (PCP Office) start amoxicillin-clavulanate 875-125 mg 2 times daily, start Benzonate 200 mg PRN, Start Prednisone 10 mg   Recent consult visits:  06/18/2021 Dr. Honor Junes MD (Endocrinology)  No Medication Changes noted  Hospital visits:  None in previous 6 months  Medications: Outpatient Encounter Medications as of 08/27/2021  Medication Sig Note   ACCU-CHEK AVIVA PLUS test strip USE TO TEST ONCE DAILY AS DIRECTED    ADVAIR HFA 115-21 MCG/ACT inhaler Inhale 2 puffs into the lungs 2 (two) times daily.     albuterol (VENTOLIN HFA) 108 (90 Base) MCG/ACT inhaler Inhale into the lungs every 4 (four) hours as needed.  01/25/2015: Received from: [name] 24 MCG capsule TAKE 1 CAPSULE BY MOUTH TWICE DAILY (Patient taking differently: Taking as needed)    aspirin 81 MG tablet Take 81 mg by mouth daily.  01/25/2015: Received from: [name]   azelastine (OPTIVAR) 0.05 % ophthalmic solution 1 drop 2 (two) times daily.    BIOTIN PO Take by mouth daily. When remembers    Blood Glucose Monitoring Suppl (ACCU-CHEK AVIVA PLUS) w/Device KIT Use to check blood sugar for type 2 diabetes.    Cholecalciferol 2000 UNITS CAPS Take by mouth daily.  01/25/2015: Received from: [name]   Continuous Blood Gluc Receiver (FREESTYLE LIBRE 2 READER) DEVI USE 1 DEVICE AS DIRECTED    Cyanocobalamin (VITAMIN B 12 PO) Take by mouth daily. When remembers    donepezil (ARICEPT) 10 MG tablet TAKE 1 TABLET(10 MG) BY MOUTH DAILY    EPINEPHrine 0.3 mg/0.3 mL IJ SOAJ injection  02/05/2016: PRN    erythromycin ophthalmic ointment Apply to sutures 4 times a day for 10-12 days.  Discontinue if allergy develops and call our office    estradiol (ESTRACE) 0.5 MG tablet  Take 1 tablet (0.5 mg total) by mouth every other day.    furosemide (LASIX) 20 MG tablet TAKE 1 TABLET(20 MG) BY MOUTH DAILY AS NEEDED FOR SWELLING    gabapentin (NEURONTIN) 800 MG tablet Take 1 tablet (800 mg total) by mouth 3 (three) times daily.    levocetirizine (XYZAL) 5 MG tablet Take 5 mg by mouth every evening.  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 REMOVE AND DISCARD AFTER 12 HOURS OR AS DIRECTED BY DOCTOR    mometasone (ELOCON) 0.1 % cream     rosuvastatin (CRESTOR) 5 MG tablet TAKE 1 TABLET(5 MG) BY MOUTH DAILY. REPLACES LOVASTATIN    Semaglutide, 1 MG/DOSE, 2 MG/1.5ML SOPN Inject 2 mg into the skin once a week. Mondays 09/19/2020: Prescribed by Dr. Honor Junes    spironolactone (ALDACTONE) 100 MG tablet Take 1 tablet (100 mg total) by mouth daily.    traMADol (ULTRAM) 50 MG tablet Take 1 every 4-6 hours as needed for pain not controlled by Tylenol    triamcinolone cream (KENALOG) 0.1 % Apply 1 application topically 2 (two) times daily.    No facility-administered encounter medications on file as of 08/27/2021.    Care Gaps: Shingrix Vaccine Urine Microalbumin (Last Completed 05/15/2019) Covid-19 Vaccine (4- Booster for [facility] series) Ophthalmology Exam (Last Completed 12/25/2019) Foot Exam (Last Completed 04/25/2020) Tetanus Vaccine (Last Completed 05/11/2011)  Hemoglobin A1C  Star Rating Drugs: Ozempic 1 mg last filled on 07/15/2021 for 28 day supply at [facility].  Rosuvastatin 5 mg last filled on 07/15/2021 for 90 day supply at [facility].     Medication Fill Gaps: None  Recent Relevant Labs: Lab Results  Component Value Date/Time   HGBA1C 7.4 02/12/2021 12:00 AM   HGBA1C 8.1 10/09/2020 12:00 AM   MICROALBUR negative 05/15/2019 09:14 AM   MICROALBUR 20 02/23/2018 10:00 AM    Kidney Function Lab Results  Component Value Date/Time   CREATININE  1.24 (H) 04/21/2021 09:44 AM   CREATININE 1.39 (H) 10/30/2020 09:54 AM   CREATININE 1.04 02/12/2014 03:53 AM   CREATININE 0.98 02/11/2014 04:21 AM   GFRNONAA >60 08/17/2019 10:49 AM   GFRNONAA 56 (L) 02/12/2014 03:53 AM   GFRAA >60 08/17/2019 10:49 AM   GFRAA >60 02/12/2014 03:53 AM    Current antihyperglycemic regimen:  Ozempic 2 mg weekly  What recent interventions/DTPs have been made to improve glycemic control:  None ID Have there been any recent hospitalizations or ED visits since last visit with CPP? No  I have attempted without success to contact this patient by phone three times to do her Diabetes Disease State call. I left a Voice message for patient to return my call.  Adherence Review: Is the patient currently on a STATIN medication? Yes Is the patient currently on ACE/ARB medication? No Does the patient have >5 day gap between last estimated fill dates? No  Anderson Malta Clinical Production designer, theatre/television/film [phone]

## 2021-08-29 ENCOUNTER — Encounter: Payer: Self-pay | Admitting: Family Medicine

## 2021-08-29 ENCOUNTER — Telehealth (INDEPENDENT_AMBULATORY_CARE_PROVIDER_SITE_OTHER): Payer: Medicare Other | Admitting: Family Medicine

## 2021-08-29 ENCOUNTER — Other Ambulatory Visit: Payer: Self-pay

## 2021-08-29 DIAGNOSIS — R051 Acute cough: Secondary | ICD-10-CM

## 2021-08-29 DIAGNOSIS — B9689 Other specified bacterial agents as the cause of diseases classified elsewhere: Secondary | ICD-10-CM

## 2021-08-29 DIAGNOSIS — R509 Fever, unspecified: Secondary | ICD-10-CM

## 2021-08-29 DIAGNOSIS — J329 Chronic sinusitis, unspecified: Secondary | ICD-10-CM | POA: Diagnosis not present

## 2021-08-29 MED ORDER — AMOXICILLIN-POT CLAVULANATE 875-125 MG PO TABS: 1.0000 | ORAL_TABLET | Freq: Two times a day (BID) | ORAL | 0 refills | Status: DC

## 2021-08-29 MED ORDER — BENZONATATE 200 MG PO CAPS: 200.0000 mg | ORAL_CAPSULE | Freq: Two times a day (BID) | ORAL | 0 refills | Status: DC | PRN

## 2021-08-29 MED ORDER — PREDNISONE 10 MG (21) PO TBPK: ORAL_TABLET | ORAL | 0 refills | Status: DC

## 2021-08-29 NOTE — Progress Notes (Signed)
Virtual telephone visit    Virtual Visit via Telephone Note   This visit type was conducted due to national recommendations for restrictions regarding the COVID-19 Pandemic (e.g. social distancing) in an effort to limit this patient's exposure and mitigate transmission in our community. Due to her co-morbid illnesses, this patient is at least at moderate risk for complications without adequate follow up. This format is felt to be most appropriate for this patient at this time. The patient did not have access to video technology or had technical difficulties with video requiring transitioning to audio format only (telephone). Physical exam was limited to content and character of the telephone converstion.    Patient location: home Provider location: [facility]  I discussed the limitations of evaluation and management by telemedicine and the availability of in person appointments. The patient expressed understanding and agreed to proceed.   Visit Date: 08/29/2021  Today's healthcare provider: Gwyneth Sprout, FNP   Chief Complaint  Patient presents with   URI   Subjective    HPI HPI     URI   Associated symptoms inlclude congestion, sinus pain and sneezing.  Recent episode started in the past 7 days.  The problem has been unchanged since onset.  The maximum temperature recorded prior to his arrival was 101 - 101.9 F.  Patient  is drinking plenty of fluids.  Patient is not a smoker.      Last edited by Gwyneth Sprout, FNP on 08/29/2021  2:36 PM.      [place] Dick Fri [place]+ Sat Sun Temp- low 101  Alka+ Wed Thurs  Home Covid neg x2 last taken Tues  UTD on vax  Works as Chemical engineer  Medications: Outpatient Medications Prior to Visit  Medication Sig   ACCU-CHEK AVIVA PLUS test strip USE TO TEST ONCE DAILY AS DIRECTED   ADVAIR HFA 115-21 MCG/ACT inhaler Inhale 2 puffs into the lungs 2 (two) times daily.    albuterol (VENTOLIN HFA) 108 (90 Base) MCG/ACT inhaler Inhale into the  lungs every 4 (four) hours as needed.    AMITIZA 24 MCG capsule TAKE 1 CAPSULE BY MOUTH TWICE DAILY (Patient taking differently: Taking as needed)   aspirin 81 MG tablet Take 81 mg by mouth daily.    azelastine (OPTIVAR) 0.05 % ophthalmic solution 1 drop 2 (two) times daily.   BIOTIN PO Take by mouth daily. When remembers   Blood Glucose Monitoring Suppl (ACCU-CHEK AVIVA PLUS) w/Device KIT Use to check blood sugar for type 2 diabetes.   Cholecalciferol 2000 UNITS CAPS Take by mouth daily.    Continuous Blood Gluc Receiver (FREESTYLE LIBRE 2 READER) DEVI USE 1 DEVICE AS DIRECTED   Cyanocobalamin (VITAMIN B 12 PO) Take by mouth daily. When remembers   donepezil (ARICEPT) 10 MG tablet TAKE 1 TABLET(10 MG) BY MOUTH DAILY   EPINEPHrine 0.3 mg/0.3 mL IJ SOAJ injection    erythromycin ophthalmic ointment Apply to sutures 4 times a day for 10-12 days.  Discontinue if allergy develops and call our office   estradiol (ESTRACE) 0.5 MG tablet Take 1 tablet (0.5 mg total) by mouth every other day.   furosemide (LASIX) 20 MG tablet TAKE 1 TABLET(20 MG) BY MOUTH DAILY AS NEEDED FOR SWELLING   gabapentin (NEURONTIN) 800 MG tablet Take 1 tablet (800 mg total) by mouth 3 (three) times daily.   levocetirizine (XYZAL) 5 MG tablet Take 5 mg by mouth every evening.    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 REMOVE AND DISCARD AFTER 12 HOURS OR AS DIRECTED BY DOCTOR   mometasone (ELOCON) 0.1 % cream    rosuvastatin (CRESTOR) 5 MG tablet TAKE 1 TABLET(5 MG) BY MOUTH DAILY. REPLACES LOVASTATIN   Semaglutide, 1 MG/DOSE, 2 MG/1.5ML SOPN Inject 2 mg into the skin once a week. Mondays   spironolactone (ALDACTONE) 100 MG tablet Take 1 tablet (100 mg total) by mouth daily.   traMADol (ULTRAM) 50 MG tablet Take 1 every 4-6 hours as needed for pain not controlled by Tylenol   triamcinolone cream (KENALOG) 0.1 % Apply 1 application topically 2 (two) times daily.   No  facility-administered medications prior to visit.    Review of Systems    Objective    There were no vitals taken for this visit.     Assessment & Plan     Problem List Items Addressed This Visit       Respiratory   Bacterial sinusitis - Primary    Symptoms since 1/25 pm OTC medications on/off as symptoms wax/wane since Fevers up to 101 Sinus fullness/tenderness       Relevant Medications   predniSONE (STERAPRED UNI-PAK 21 TAB) 10 MG (21) TBPK tablet   amoxicillin-clavulanate (AUGMENTIN) 875-125 MG tablet   benzonatate (TESSALON) 200 MG capsule     Other   Fever and chills    Likely d/t sinus complaints x10 hx      Acute cough    Cough, likely d/t drainage Encourage increase in PO intake       Relevant Medications   benzonatate (TESSALON) 200 MG capsule     Return in about 2 weeks (around 09/12/2021), or if symptoms worsen or fail to improve- go to UC or ED/call 9-1-1.    I discussed the assessment and treatment plan with the patient. The patient was provided an opportunity to ask questions and all were answered. The patient agreed with the plan and demonstrated an understanding of the instructions.   The patient was advised to call back or seek an in-person evaluation if the symptoms worsen or if the condition fails to improve as anticipated.  I provided 13 minutes of non-face-to-face time during this encounter.  Vonna Kotyk, FNP, have reviewed all documentation for this visit. The documentation on 08/29/21 for the exam, diagnosis, procedures, and orders are all accurate and complete.   Patient seen and examined by [name],  FNP note scribed by Jennings [employer], [place], [place] [phone] (phone) [phone] (fax)  [place]

## 2021-08-29 NOTE — Assessment & Plan Note (Signed)
Likely d/t sinus complaints x10 hx

## 2021-08-29 NOTE — Assessment & Plan Note (Signed)
Cough, likely d/t drainage Encourage increase in PO intake

## 2021-08-29 NOTE — Assessment & Plan Note (Signed)
Symptoms since 1/25 pm OTC medications on/off as symptoms wax/wane since Fevers up to 101 Sinus fullness/tenderness

## 2021-09-05 DIAGNOSIS — J301 Allergic rhinitis due to pollen: Secondary | ICD-10-CM | POA: Diagnosis not present

## 2021-09-05 DIAGNOSIS — J3089 Other allergic rhinitis: Secondary | ICD-10-CM | POA: Diagnosis not present

## 2021-09-08 ENCOUNTER — Other Ambulatory Visit: Payer: Self-pay | Admitting: Family Medicine

## 2021-09-16 DIAGNOSIS — J3089 Other allergic rhinitis: Secondary | ICD-10-CM | POA: Diagnosis not present

## 2021-09-16 DIAGNOSIS — J3081 Allergic rhinitis due to animal (cat) (dog) hair and dander: Secondary | ICD-10-CM | POA: Diagnosis not present

## 2021-09-16 DIAGNOSIS — J301 Allergic rhinitis due to pollen: Secondary | ICD-10-CM | POA: Diagnosis not present

## 2021-09-17 ENCOUNTER — Ambulatory Visit (INDEPENDENT_AMBULATORY_CARE_PROVIDER_SITE_OTHER): Payer: Medicare Other | Admitting: Family Medicine

## 2021-09-17 ENCOUNTER — Other Ambulatory Visit: Payer: Self-pay

## 2021-09-17 ENCOUNTER — Encounter: Payer: Self-pay | Admitting: Family Medicine

## 2021-09-17 VITALS — BP 136/63 | HR 68 | Temp 98.0°F | Resp 14 | Wt 145.0 lb

## 2021-09-17 DIAGNOSIS — E1169 Type 2 diabetes mellitus with other specified complication: Secondary | ICD-10-CM | POA: Diagnosis not present

## 2021-09-17 DIAGNOSIS — N6313 Unspecified lump in the right breast, lower outer quadrant: Secondary | ICD-10-CM | POA: Diagnosis not present

## 2021-09-17 DIAGNOSIS — E559 Vitamin D deficiency, unspecified: Secondary | ICD-10-CM | POA: Diagnosis not present

## 2021-09-17 DIAGNOSIS — I10 Essential (primary) hypertension: Secondary | ICD-10-CM | POA: Diagnosis not present

## 2021-09-17 DIAGNOSIS — Z0000 Encounter for general adult medical examination without abnormal findings: Secondary | ICD-10-CM | POA: Diagnosis not present

## 2021-09-17 NOTE — Progress Notes (Signed)
I,[name],acting as a scribe for Sharon Huh, MD.,have documented all relevant documentation on the behalf of Sharon Huh, MD,as directed by  Sharon Huh, MD while in the presence of Sharon Huh, MD.   Annual Wellness Visit     Patient: Sharon Nelson, Female    DOB: 06-20-1951, 71 y.o.   MRN: [id number] Visit Date: 09/17/2021  Today's Provider: [name], MD   Chief Complaint  Patient presents with   Medicare Wellness   Hypertension   Subjective    Sharon Nelson is a 71 y.o. female who presents today for her Annual Wellness Visit. She reports consuming a general diet. The patient does not participate in regular exercise at present. She generally feels fairly well. She reports sleeping fairly well. She does have additional problems to discuss today.   HPI Hypertension, follow-up  BP Readings from Last 3 Encounters:  09/17/21 136/63  04/21/21 (!) 135/59  10/30/20 (!) 141/69   Wt Readings from Last 3 Encounters:  09/17/21 145 lb (65.8 kg)  04/21/21 149 lb (67.6 kg)  10/30/20 157 lb 6.4 oz (71.4 kg)     She was last seen for hypertension 4 months ago.  BP at that visit was 135/59. Management since that visit includes continuing same medication.  She reports good compliance with treatment. She is not having side effects.  She is following a Regular diet. She is not exercising. She does not smoke.  Use of agents associated with hypertension: none.   Outside blood pressures are not checked. Symptoms: No chest pain No chest pressure  No palpitations No syncope  No dyspnea No orthopnea  No paroxysmal nocturnal dyspnea No lower extremity edema   Pertinent labs: Lab Results  Component Value Date   CHOL 154 10/30/2020   HDL 41 10/30/2020   LDLCALC 87 10/30/2020   TRIG 149 10/30/2020   CHOLHDL 3.8 10/30/2020   Lab Results  Component Value Date   NA 145 (H) 04/21/2021   K 4.7 04/21/2021   CREATININE 1.24 (H) 04/21/2021   EGFR 46 (L)  04/21/2021   GLUCOSE 90 04/21/2021   TSH 0.965 10/30/2020     The 10-year ASCVD risk score (Arnett DK, et al., 2019) is: 24.3%   ---------------------------------------------------------------------------------------------------   Diabetes managed by Dr. Honor Junes Lab Results  Component Value Date   HGBA1C 7.3 06/18/2021    She does reports she has more trouble with persistent nausea and frequent bowel movements for several months and wondering if this is related to semaglutide. Was previously followed by Dr. Marius Ditch and prescribed Amitiza for chronic constipation.    Breast lump: Patient complains of a painless knot near her right breast. She first noticed the knot 1 week ago. Patient states she had shingles in that same location a few  years ago.   Medications: Outpatient Medications Prior to Visit  Medication Sig   ADVAIR HFA 115-21 MCG/ACT inhaler Inhale 2 puffs into the lungs 2 (two) times daily.    albuterol (VENTOLIN HFA) 108 (90 Base) MCG/ACT inhaler Inhale into the lungs every 4 (four) hours as needed.    aspirin 81 MG tablet Take 81 mg by mouth daily.    azelastine (OPTIVAR) 0.05 % ophthalmic solution 1 drop 2 (two) times daily.   BIOTIN PO Take by mouth daily. When remembers   Cholecalciferol 2000 UNITS CAPS Take by mouth daily.    Continuous Blood Gluc Receiver (FREESTYLE LIBRE 2 READER) DEVI USE 1 DEVICE AS DIRECTED   donepezil (  ARICEPT) 10 MG tablet TAKE 1 TABLET(10 MG) BY MOUTH DAILY   EPINEPHrine 0.3 mg/0.3 mL IJ SOAJ injection    estradiol (ESTRACE) 0.5 MG tablet Take 1 tablet (0.5 mg total) by mouth every other day.   furosemide (LASIX) 20 MG tablet TAKE 1 TABLET(20 MG) BY MOUTH DAILY AS NEEDED FOR SWELLING   gabapentin (NEURONTIN) 800 MG tablet Take 1 tablet (800 mg total) by mouth 3 (three) times daily.   levocetirizine (XYZAL) 5 MG tablet Take 5 mg by mouth every evening.    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  REMOVE AND DISCARD AFTER 12 HOURS OR AS DIRECTED BY DOCTOR   mometasone (ELOCON) 0.1 % cream    rosuvastatin (CRESTOR) 5 MG tablet TAKE 1 TABLET(5 MG) BY MOUTH DAILY. REPLACES LOVASTATIN   Semaglutide, 1 MG/DOSE, 2 MG/1.5ML SOPN Inject 2 mg into the skin once a week. Mondays   spironolactone (ALDACTONE) 100 MG tablet TAKE 1 TABLET(100 MG) BY MOUTH DAILY   traMADol (ULTRAM) 50 MG tablet Take 1 every 4-6 hours as needed for pain not controlled by Tylenol   triamcinolone cream (KENALOG) 0.1 % Apply 1 application topically 2 (two) times daily.   [DISCONTINUED] AMITIZA 24 MCG capsule TAKE 1 CAPSULE BY MOUTH TWICE DAILY (Patient taking differently: Taking as needed)   [DISCONTINUED] ACCU-CHEK AVIVA PLUS test strip USE TO TEST ONCE DAILY AS DIRECTED (Patient not taking: Reported on 09/17/2021)   [DISCONTINUED] amoxicillin-clavulanate (AUGMENTIN) 875-125 MG tablet Take 1 tablet by mouth 2 (two) times daily. (Patient not taking: Reported on 09/17/2021)   [DISCONTINUED] benzonatate (TESSALON) 200 MG capsule Take 1 capsule (200 mg total) by mouth 2 (two) times daily as needed for cough. (Patient not taking: Reported on 09/17/2021)   [DISCONTINUED] Blood Glucose Monitoring Suppl (ACCU-CHEK AVIVA PLUS) w/Device KIT Use to check blood sugar for type 2 diabetes. (Patient not taking: Reported on 09/17/2021)   [DISCONTINUED] Cyanocobalamin (VITAMIN B 12 PO) Take by mouth daily. When remembers (Patient not taking: Reported on 09/17/2021)   [DISCONTINUED] erythromycin ophthalmic ointment Apply to sutures 4 times a day for 10-12 days.  Discontinue if allergy develops and call our office   [DISCONTINUED] predniSONE (STERAPRED UNI-PAK 21 TAB) 10 MG (21) TBPK tablet Take as directed on package. Recommend morning dosing, with meal. (Patient not taking: Reported on 09/17/2021)   No facility-administered medications prior to visit.    Allergies  Allergen Reactions   Lisinopril Anaphylaxis and Swelling    WAS ON LIFE SUPPORT     Iodinated Contrast Media Rash    IV dye, red dye Other Reaction: feeling of heat   Cinnamon Swelling    Other reaction(s): Swollen lips   Ertugliflozin Itching   Omeprazole     Broke out into rash   Peanut Oil Swelling   Penicillins Swelling   Sulfa Antibiotics Swelling   Verapamil Hcl Er Swelling   Amitriptyline Rash   Januvia [Sitagliptin] Rash   Milk Protein Rash   Pioglitazone Rash   Sglt2 Inhibitors Rash    Same reaction to Wilder [place]ade and Ste[place]atro    Patient Care Team: Birdie Sons, MD as PCP - General (Family Medicine) Leandrew Koyanagi, MD as Referring Physician (Ophthalmology) Corey Skains, MD as Consulting Physician (Cardiology) Lin Landsman, MD as Consulting Physician (Gastroenterology) Mosetta Anis, MD as Referring Physician (Allergy) Germaine Pomfret, [facility] as Pharmacist (Pharmacist) Florian Buff, MD as Referring Physician (Orthopedic Surgery) Lonia Farber, MD as Consulting Physician (  Internal Medicine)  Review of Systems  Constitutional:  Negative for chills, fatigue and fever.  HENT:  Negative for congestion, ear pain, rhinorrhea, sneezing and sore throat.   Eyes: Negative.  Negative for pain and redness.  Respiratory:  Negative for cough, shortness of breath and wheezing.   Cardiovascular:  Negative for chest pain and leg swelling.  Gastrointestinal:  Negative for abdominal pain, blood in stool, constipation, diarrhea and nausea.  Endocrine: Negative for polydipsia and polyphagia.  Genitourinary: Negative.  Negative for dysuria, flank pain, hematuria, pelvic pain, vaginal bleeding and vaginal discharge.  Musculoskeletal:  Negative for arthralgias, back pain, gait problem and joint swelling.       Lump in right breast  Skin:  Negative for rash.  Neurological: Negative.  Negative for dizziness, tremors, seizures, weakness, light-headedness, numbness and headaches.  Hematological:  Negative for adenopathy.   Psychiatric/Behavioral: Negative.  Negative for behavioral problems, confusion and dysphoric mood. The patient is not nervous/anxious and is not hyperactive.        Objective    Vitals: BP 136/63 (BP Location: Left Arm, Patient Position: Sitting, Cuff Size: Large)    Pulse 68    Temp 98 F (36.7 C) (Oral)    Resp 14    Wt 145 lb (65.8 kg)    SpO2 100% Comment: room air   BMI 28.80 kg/m    Physical Exam  General Appearance:     Well developed, well nourished female. Alert, cooperative, in no acute distress, appears stated age   Head:    Normocephalic, without obvious abnormality, atraumatic  Eyes:    PERRL, conjunctiva/corneas clear, EOM's intact, fundi    benign, both eyes  Ears:    Normal TM's and external ear canals, both ears  Neck:   Supple, symmetrical, trachea midline, no adenopathy;    thyroid:  no enlargement/tenderness/nodules; no carotid   bruit or JVD  Back:     Symmetric, no curvature, ROM normal, no CVA tenderness  Lungs:     Clear to auscultation bilaterally, respirations unlabored  Chest Wall:    No tenderness or deformity   Heart:    Normal heart rate. Normal rhythm.  2/6 at right upper sternal border  Breast Exam:    Inspection negative, No nipple discharge or bleeding, No axillary or supraclavicular adenopathy, there is a subtle non-tender pea sized mass about 1cm from areola at 7 oclock  Abdomen:     Soft, non-tender, bowel sounds active all four quadrants,    no masses, no organomegaly  Pelvic:    not indicated; status post hysterectomy, negative ROS  Extremities:   All extremities are intact. No cyanosis or edema  Pulses:   2+ and symmetric all extremities  Skin:   Skin color, texture, turgor normal, no rashes or lesions  Neurologic:   CNII-XII intact, normal strength, sensation and reflexes    throughout     Most recent functional status assessment: In your present state of health, do you have any difficulty performing the following activities: 09/17/2021   Hearing? Y  Vision? Y  Difficulty concentrating or making decisions? N  Walking or climbing stairs? N  Dressing or bathing? N  Doing errands, shopping? N  Some recent data might be hidden   Most recent fall risk assessment: Fall Risk  09/17/2021  Falls in the past year? 0  Comment -  Number falls in past yr: 0  Injury with Fall? 0  Risk for fall due to : No Fall Risks  Follow  up Falls evaluation completed    Most recent depression screenings: PHQ 2/9 Scores 09/17/2021 09/03/2020  PHQ - 2 Score 0 0  PHQ- 9 Score 4 -   Most recent cognitive screening: 6CIT Screen 08/10/2018  What Year? 0 points  What month? 0 points  What time? 0 points  Count back from 20 0 points  Months in reverse 0 points  Repeat phrase 0 points  Total Score 0   Most recent Audit-C alcohol use screening Alcohol Use Disorder Test (AUDIT) 09/17/2021  1. How often do you have a drink containing alcohol? 0  2. How many drinks containing alcohol do you have on a typical day when you are drinking? 0  3. How often do you have six or more drinks on one occasion? 0  AUDIT-C Score 0  Alcohol Brief Interventions/Follow-up -   A score of 3 or more in women, and 4 or more in men indicates increased risk for alcohol abuse, EXCEPT if all of the points are from question 1   Results for orders placed or performed in visit on 09/17/21  Hemoglobin A1c  Result Value Ref Range   Hemoglobin A1C 7.3     Assessment & Plan     Annual wellness visit done today including the all of the following: Reviewed patient's Family Medical History Reviewed and updated list of patient's medical providers Assessment of cognitive impairment was done Assessed patient's functional ability Established a written schedule for health screening [place] Completed and Reviewed  Exercise Activities and Dietary recommendations  Goals      Exercise 3x per week (30 min per time)     Recommend starting to exercise for 3  days a week for 30 minutes at a time.      Increase water intake     Starting 07/10/16, I will increase my water intake to 4 glasses a day.     Monitor and Manage My Blood Sugar-Diabetes Type 2     Timeframe:  Long-Range Goal Priority:  High Start Date:  09/27/2020                            Expected End Date:  03/28/2022                     Follow Up Date 06/27/2021     -check blood sugar at least 3 times daily - check blood sugar before and after exercise - check blood sugar if I feel it is too high or too low - take the blood sugar meter to all doctor visits    Why is this important?   Checking your blood sugar at home helps to keep it from getting very high or very low.  Writing the results in a diary or log helps the doctor know how to care for you.  Your blood sugar log should have the time, date and the results.  Also, write down the amount of insulin or other medicine that you take.  Other information, like what you ate, exercise done and how you were feeling, will also be helpful.     Notes:         Immunization History  Administered Date(s) Administered   Fluad Quad(high Dose 65+) 03/27/2019, 06/13/2020, 04/21/2021   Influenza, High Dose Seasonal PF 04/05/2015, 05/01/2016, 04/19/2017, 04/18/2018   Moderna Sars-Covid-2 Vaccination 09/08/2019, 10/09/2019, 06/13/2020   Pneumococcal Conjugate-13 05/07/2014   Pneumococcal Polysaccharide-23 05/11/2011, 07/30/2017  Tdap 05/11/2011    Health Maintenance  Topic Date Due   Zoster Vaccines- Shingrix (1 of 2) Never done   URINE MICROALBUMIN  05/14/2020   COVID-19 Vaccine (4 - Booster for Moderna series) 08/08/2020   OPHTHALMOLOGY EXAM  12/06/2020   FOOT EXAM  04/25/2021   TETANUS/TDAP  05/10/2021   DEXA SCAN  09/28/2021   MAMMOGRAM  11/20/2021   HEMOGLOBIN A1C  12/16/2021   COLONOSCOPY (Pts 45-84yr Insurance coverage will need to be confirmed)  05/05/2023   Pneumonia Vaccine 71 Years old  Completed   INFLUENZA  VACCINE  Completed   Hepatitis C Screening  Completed   HPV VACCINES  Aged Out     Discussed health benefits of physical activity, and encouraged her to engage in regular exercise appropriate for her age and condition.    1. Mass of lower outer quadrant of right breast  - MM Digital Diagnostic Bilat; Future - [place] UNI RIGHT INC AXILLA; Future  2. Type 2 diabetes mellitus with other specified complication, without long-term current use of insulin ([place] Fairly well controlled, managed by Dr. OHonor Junes May be having some adverse GI effects which she needs to discuss with Dr. OHonor Junes  - Comprehensive metabolic panel - Lipid panel  3. Essential (primary) hypertension Well controlled.  Continue current medications.   - EKG 12-Lead  4. Vitamin D deficiency  - VITAMIN D 25 Hydroxy (Vit-D Deficiency, Fractures)      The entirety of the information documented in the History of Present Illness, Review of Systems and Physical Exam were personally obtained by me. Portions of this information were initially documented by the CMA and reviewed by me for thoroughness and accuracy.     DLelon Huh MD  [phone](phone) [phone](fax)  [place]

## 2021-09-18 LAB — COMPREHENSIVE METABOLIC PANEL
ALT: 12 IU/L (ref 0–32)
AST: 19 IU/L (ref 0–40)
Albumin/Globulin Ratio: 1.5 (ref 1.2–2.2)
Albumin: 4.6 g/dL (ref 3.7–4.7)
Alkaline Phosphatase: 85 IU/L (ref 44–121)
BUN/Creatinine Ratio: 13 (ref 12–28)
BUN: 15 mg/dL (ref 8–27)
Bilirubin Total: 0.4 mg/dL (ref 0.0–1.2)
CO2: 23 mmol/L (ref 20–29)
Calcium: 9.6 mg/dL (ref 8.7–10.3)
Chloride: 100 mmol/L (ref 96–106)
Creatinine, Ser: 1.2 mg/dL — ABNORMAL HIGH (ref 0.57–1.00)
Globulin, Total: 3 g/dL (ref 1.5–4.5)
Glucose: 109 mg/dL — ABNORMAL HIGH (ref 70–99)
Potassium: 4.7 mmol/L (ref 3.5–5.2)
Sodium: 137 mmol/L (ref 134–144)
Total Protein: 7.6 g/dL (ref 6.0–8.5)
eGFR: 48 mL/min/{1.73_m2} — ABNORMAL LOW (ref >59)

## 2021-09-18 LAB — LIPID PANEL
Chol/HDL Ratio: 4.2 ratio (ref 0.0–4.4)
Cholesterol, Total: 180 mg/dL (ref 100–199)
HDL: 43 mg/dL (ref >39)
LDL Chol Calc (NIH): 113 mg/dL — ABNORMAL HIGH (ref 0–99)
Triglycerides: 133 mg/dL (ref 0–149)
VLDL Cholesterol Cal: 24 mg/dL (ref 5–40)

## 2021-09-18 LAB — VITAMIN D 25 HYDROXY (VIT D DEFICIENCY, FRACTURES): Vit D, 25-Hydroxy: 69.9 ng/mL (ref 30.0–100.0)

## 2021-09-19 DIAGNOSIS — J301 Allergic rhinitis due to pollen: Secondary | ICD-10-CM | POA: Diagnosis not present

## 2021-09-19 DIAGNOSIS — J3081 Allergic rhinitis due to animal (cat) (dog) hair and dander: Secondary | ICD-10-CM | POA: Diagnosis not present

## 2021-09-19 DIAGNOSIS — J3089 Other allergic rhinitis: Secondary | ICD-10-CM | POA: Diagnosis not present

## 2021-09-22 ENCOUNTER — Telehealth: Payer: Self-pay

## 2021-09-22 DIAGNOSIS — N6313 Unspecified lump in the right breast, lower outer quadrant: Secondary | ICD-10-CM

## 2021-09-22 NOTE — Telephone Encounter (Signed)
Copied from Laura [id number]. Topic: General - Other >> Sep 22, 2021  3:42 PM Parke Poisson wrote: Reason for CRM: Per [place] [facility] they will need mammogram order corrected to read diagnostic bilateral mammogram TOMO,[id number] Thanks

## 2021-09-23 DIAGNOSIS — J301 Allergic rhinitis due to pollen: Secondary | ICD-10-CM | POA: Diagnosis not present

## 2021-09-23 DIAGNOSIS — J3081 Allergic rhinitis due to animal (cat) (dog) hair and dander: Secondary | ICD-10-CM | POA: Diagnosis not present

## 2021-09-23 DIAGNOSIS — J3089 Other allergic rhinitis: Secondary | ICD-10-CM | POA: Diagnosis not present

## 2021-09-23 NOTE — Addendum Note (Signed)
Addended by: Randal Buba on: 09/23/2021 10:02 AM   Modules accepted: Orders

## 2021-09-23 NOTE — Telephone Encounter (Signed)
Order corrected

## 2021-09-30 DIAGNOSIS — J301 Allergic rhinitis due to pollen: Secondary | ICD-10-CM | POA: Diagnosis not present

## 2021-09-30 DIAGNOSIS — J3089 Other allergic rhinitis: Secondary | ICD-10-CM | POA: Diagnosis not present

## 2021-10-07 ENCOUNTER — Telehealth: Payer: Self-pay | Admitting: Gastroenterology

## 2021-10-07 DIAGNOSIS — J301 Allergic rhinitis due to pollen: Secondary | ICD-10-CM | POA: Diagnosis not present

## 2021-10-07 DIAGNOSIS — J3089 Other allergic rhinitis: Secondary | ICD-10-CM | POA: Diagnosis not present

## 2021-10-07 NOTE — Telephone Encounter (Signed)
Patient left VM this morning, I returned the call to try and make her and appt. Patient left another VM, I returned call and no answer. Needs and office visit with Dr Marius Ditch, if you get in contact with her before I do. ?

## 2021-10-14 DIAGNOSIS — J3081 Allergic rhinitis due to animal (cat) (dog) hair and dander: Secondary | ICD-10-CM | POA: Diagnosis not present

## 2021-10-14 DIAGNOSIS — J3089 Other allergic rhinitis: Secondary | ICD-10-CM | POA: Diagnosis not present

## 2021-10-14 DIAGNOSIS — J301 Allergic rhinitis due to pollen: Secondary | ICD-10-CM | POA: Diagnosis not present

## 2021-10-15 ENCOUNTER — Ambulatory Visit
Admission: RE | Admit: 2021-10-15 | Discharge: 2021-10-15 | Disposition: A | Discharge status: Home / Self Care | Payer: Medicare Other | Source: Ambulatory Visit | Attending: Family Medicine | Admitting: Family Medicine

## 2021-10-15 ENCOUNTER — Other Ambulatory Visit: Payer: Self-pay

## 2021-10-15 ENCOUNTER — Other Ambulatory Visit: Payer: Self-pay | Admitting: Family Medicine

## 2021-10-15 ENCOUNTER — Other Ambulatory Visit: Payer: Medicare Other

## 2021-10-15 DIAGNOSIS — N6313 Unspecified lump in the right breast, lower outer quadrant: Secondary | ICD-10-CM | POA: Diagnosis not present

## 2021-10-15 DIAGNOSIS — R928 Other abnormal and inconclusive findings on diagnostic imaging of breast: Secondary | ICD-10-CM

## 2021-10-15 DIAGNOSIS — R921 Mammographic calcification found on diagnostic imaging of breast: Secondary | ICD-10-CM | POA: Diagnosis not present

## 2021-10-15 DIAGNOSIS — R922 Inconclusive mammogram: Secondary | ICD-10-CM | POA: Diagnosis not present

## 2021-10-15 DIAGNOSIS — N63 Unspecified lump in unspecified breast: Secondary | ICD-10-CM

## 2021-10-23 DIAGNOSIS — J3089 Other allergic rhinitis: Secondary | ICD-10-CM | POA: Diagnosis not present

## 2021-10-23 DIAGNOSIS — J301 Allergic rhinitis due to pollen: Secondary | ICD-10-CM | POA: Diagnosis not present

## 2021-10-28 DIAGNOSIS — J3089 Other allergic rhinitis: Secondary | ICD-10-CM | POA: Diagnosis not present

## 2021-10-28 DIAGNOSIS — J301 Allergic rhinitis due to pollen: Secondary | ICD-10-CM | POA: Diagnosis not present

## 2021-11-06 DIAGNOSIS — J301 Allergic rhinitis due to pollen: Secondary | ICD-10-CM | POA: Diagnosis not present

## 2021-11-06 DIAGNOSIS — J3089 Other allergic rhinitis: Secondary | ICD-10-CM | POA: Diagnosis not present

## 2021-11-11 ENCOUNTER — Ambulatory Visit
Admission: RE | Admit: 2021-11-11 | Discharge: 2021-11-11 | Disposition: A | Discharge status: Home / Self Care | Payer: Medicare Other | Source: Ambulatory Visit | Attending: Family Medicine | Admitting: Family Medicine

## 2021-11-11 DIAGNOSIS — R921 Mammographic calcification found on diagnostic imaging of breast: Secondary | ICD-10-CM

## 2021-11-11 DIAGNOSIS — J3089 Other allergic rhinitis: Secondary | ICD-10-CM | POA: Diagnosis not present

## 2021-11-11 DIAGNOSIS — Z9889 Other specified postprocedural states: Secondary | ICD-10-CM

## 2021-11-11 DIAGNOSIS — N6081 Other benign mammary dysplasias of right breast: Secondary | ICD-10-CM | POA: Diagnosis not present

## 2021-11-11 DIAGNOSIS — R928 Other abnormal and inconclusive findings on diagnostic imaging of breast: Secondary | ICD-10-CM | POA: Diagnosis not present

## 2021-11-11 DIAGNOSIS — N6082 Other benign mammary dysplasias of left breast: Secondary | ICD-10-CM | POA: Diagnosis not present

## 2021-11-11 DIAGNOSIS — N63 Unspecified lump in unspecified breast: Secondary | ICD-10-CM | POA: Diagnosis not present

## 2021-11-11 DIAGNOSIS — J301 Allergic rhinitis due to pollen: Secondary | ICD-10-CM | POA: Diagnosis not present

## 2021-11-11 DIAGNOSIS — N6314 Unspecified lump in the right breast, lower inner quadrant: Secondary | ICD-10-CM | POA: Diagnosis not present

## 2021-11-11 HISTORY — DX: Other specified postprocedural states: Z98.890

## 2021-11-11 HISTORY — PX: BREAST BIOPSY: SHX20

## 2021-11-12 LAB — SURGICAL PATHOLOGY

## 2021-11-18 DIAGNOSIS — J3089 Other allergic rhinitis: Secondary | ICD-10-CM | POA: Diagnosis not present

## 2021-11-18 DIAGNOSIS — J3081 Allergic rhinitis due to animal (cat) (dog) hair and dander: Secondary | ICD-10-CM | POA: Diagnosis not present

## 2021-11-18 DIAGNOSIS — J301 Allergic rhinitis due to pollen: Secondary | ICD-10-CM | POA: Diagnosis not present

## 2021-11-24 ENCOUNTER — Other Ambulatory Visit: Payer: Self-pay | Admitting: Family Medicine

## 2021-11-24 DIAGNOSIS — N951 Menopausal and female climacteric states: Secondary | ICD-10-CM

## 2021-11-25 DIAGNOSIS — J3089 Other allergic rhinitis: Secondary | ICD-10-CM | POA: Diagnosis not present

## 2021-11-25 DIAGNOSIS — J301 Allergic rhinitis due to pollen: Secondary | ICD-10-CM | POA: Diagnosis not present

## 2021-12-02 DIAGNOSIS — J3089 Other allergic rhinitis: Secondary | ICD-10-CM | POA: Diagnosis not present

## 2021-12-02 DIAGNOSIS — J301 Allergic rhinitis due to pollen: Secondary | ICD-10-CM | POA: Diagnosis not present

## 2021-12-09 DIAGNOSIS — J3089 Other allergic rhinitis: Secondary | ICD-10-CM | POA: Diagnosis not present

## 2021-12-09 DIAGNOSIS — J301 Allergic rhinitis due to pollen: Secondary | ICD-10-CM | POA: Diagnosis not present

## 2021-12-10 ENCOUNTER — Ambulatory Visit: Payer: Medicare Other | Admitting: Gastroenterology

## 2021-12-11 ENCOUNTER — Telehealth: Payer: Self-pay | Admitting: Family Medicine

## 2021-12-11 NOTE — Telephone Encounter (Signed)
There are several different accucheck test strips. Which machine is she using.

## 2021-12-11 NOTE — Telephone Encounter (Signed)
Medication Refill - Medication: Accu check test strips   Has the patient contacted their pharmacy? No.  (Agent: If no, request that the patient contact the pharmacy for the refill. If patient does not wish to contact the pharmacy document the reason why and proceed with request.) Patient stated she uses this as back up and she has no test strips and would like this request expedited   Preferred Pharmacy (with phone number or street name):  [id number] [address], [place] [facility] Phone:  [phone]  Fax:  [phone]      Has the patient been seen for an appointment in the last year OR does the patient have an upcoming appointment? Yes.    Agent: Please be advised that RX refills may take up to 3 business days. We ask that you follow-up with your pharmacy.

## 2021-12-15 NOTE — Telephone Encounter (Signed)
Patient is at work and does not remember the name of machine.  Advised to call back and let us know when she gets home.

## 2021-12-16 DIAGNOSIS — J3089 Other allergic rhinitis: Secondary | ICD-10-CM | POA: Diagnosis not present

## 2021-12-16 DIAGNOSIS — J301 Allergic rhinitis due to pollen: Secondary | ICD-10-CM | POA: Diagnosis not present

## 2021-12-23 DIAGNOSIS — J301 Allergic rhinitis due to pollen: Secondary | ICD-10-CM | POA: Diagnosis not present

## 2021-12-23 DIAGNOSIS — J3089 Other allergic rhinitis: Secondary | ICD-10-CM | POA: Diagnosis not present

## 2021-12-29 ENCOUNTER — Other Ambulatory Visit: Payer: Self-pay

## 2021-12-29 MED ORDER — ACCU-CHEK AVIVA PLUS VI STRP: ORAL_STRIP | 12 refills | Status: AC

## 2021-12-29 NOTE — Telephone Encounter (Signed)
Accu-Chek Aviva Plus test strips. Per pt only needs test strips .

## 2021-12-30 DIAGNOSIS — E1122 Type 2 diabetes mellitus with diabetic chronic kidney disease: Secondary | ICD-10-CM | POA: Diagnosis not present

## 2021-12-30 DIAGNOSIS — N1831 Chronic kidney disease, stage 3a: Secondary | ICD-10-CM | POA: Diagnosis not present

## 2021-12-30 NOTE — Progress Notes (Signed)
I,Sulibeya S Dimas,acting as a scribe for [name], MD.,have documented all relevant documentation on the behalf of [name], MD,as directed by  [name], MD while in the presence of [name], MD.   Established patient visit   Patient: Sharon Nelson   DOB: 1950-09-28   71 y.o. Female  MRN: [id number] Visit Date: 12/31/2021  Today's healthcare provider: [name], MD   Chief Complaint  Patient presents with   Hypertension   Subjective    HPI  Hypertension, follow-up  BP Readings from Last 3 Encounters:  12/31/21 (!) 116/50  09/17/21 136/63  04/21/21 (!) 135/59   Wt Readings from Last 3 Encounters:  12/31/21 147 lb 3.2 oz (66.8 kg)  09/17/21 145 lb (65.8 kg)  04/21/21 149 lb (67.6 kg)     She was last seen for hypertension 4 months ago.  BP at that visit was 136/63 . Management since that visit includes no changes.  She reports excellent compliance with treatment. She is not having side effects.  She is following a Low Sodium, Diabetic diet. She is exercising. She does not smoke.  Use of agents associated with hypertension: none.   Outside blood pressures are not being checked. Symptoms: No chest pain No chest pressure  No palpitations No syncope  No dyspnea No orthopnea  No paroxysmal nocturnal dyspnea No lower extremity edema   Pertinent labs Lab Results  Component Value Date   CHOL 180 09/17/2021   HDL 43 09/17/2021   LDLCALC 113 (H) 09/17/2021   TRIG 133 09/17/2021   CHOLHDL 4.2 09/17/2021   Lab Results  Component Value Date   NA 137 09/17/2021   K 4.7 09/17/2021   CREATININE 1.20 (H) 09/17/2021   EGFR 48 (L) 09/17/2021   GLUCOSE 109 (H) 09/17/2021   TSH 0.965 10/30/2020     The 10-year ASCVD risk score (Arnett DK, et al., 2019) is: 21.7%  --------------------------------------------------------------------------------------------------- Follow up for facial pain  The patient was last seen for this 8 months  ago. Changes made at last visit include no changes.  She reports fair compliance with treatment. Patient missed some doses of gabapentin which helps with shingle pains in right side . She feels that condition is worsening. Often feels like a pressure on the side of her face.  She is not having side effects.   -----------------------------------------------------------------------------------------   Medications: Outpatient Medications Prior to Visit  Medication Sig   ADVAIR HFA 115-21 MCG/ACT inhaler Inhale 2 puffs into the lungs 2 (two) times daily.    albuterol (VENTOLIN HFA) 108 (90 Base) MCG/ACT inhaler Inhale into the lungs every 4 (four) hours as needed.    aspirin 81 MG tablet Take 81 mg by mouth daily.    Cholecalciferol 2000 UNITS CAPS Take by mouth daily.    Continuous Blood Gluc Receiver (FREESTYLE LIBRE 2 READER) DEVI USE 1 DEVICE AS DIRECTED   donepezil (ARICEPT) 10 MG tablet TAKE 1 TABLET(10 MG) BY MOUTH DAILY   EPINEPHrine 0.3 mg/0.3 mL IJ SOAJ injection    estradiol (ESTRACE) 0.5 MG tablet TAKE 1 TABLET(0.5 MG) BY MOUTH EVERY OTHER DAY   furosemide (LASIX) 20 MG tablet TAKE 1 TABLET(20 MG) BY MOUTH DAILY AS NEEDED FOR SWELLING   gabapentin (NEURONTIN) 800 MG tablet Take 1 tablet (800 mg total) by mouth 3 (three) times daily.   levocetirizine (XYZAL) 5 MG tablet Take 5 mg by mouth every evening.    lidocaine (LIDODERM) 5 % PLACE 1 PATCH ONTO SKIN  EVERY DAY FOR POST HERPETIC NEURALGIA. REMOVE AND DISCARD AFTER 12 HOURS OR AS DIRECTED BY DOCTOR   rosuvastatin (CRESTOR) 5 MG tablet TAKE 1 TABLET(5 MG) BY MOUTH DAILY. REPLACES LOVASTATIN   Semaglutide, 1 MG/DOSE, 2 MG/1.5ML SOPN Inject 2 mg into the skin once a week. Mondays   spironolactone (ALDACTONE) 100 MG tablet TAKE 1 TABLET(100 MG) BY MOUTH DAILY   traMADol (ULTRAM) 50 MG tablet Take 1 every 4-6 hours as needed for pain not controlled by Tylenol   azelastine (OPTIVAR) 0.05 % ophthalmic solution 1 drop 2 (two) times  daily. (Patient not taking: Reported on 12/31/2021)   BIOTIN PO Take by mouth daily. When remembers (Patient not taking: Reported on 12/31/2021)   glucose blood (ACCU-CHEK AVIVA PLUS) test strip Use as instructed   triamcinolone cream (KENALOG) 0.1 % Apply 1 application topically 2 (two) times daily. (Patient not taking: Reported on 12/31/2021)   [DISCONTINUED] mometasone (ELOCON) 0.1 % cream  (Patient not taking: Reported on 12/31/2021)   No facility-administered medications prior to visit.    Review of Systems  Constitutional:  Positive for fatigue. Negative for activity change and appetite change.  Respiratory:  Positive for shortness of breath. Negative for chest tightness and wheezing.   Cardiovascular:  Negative for chest pain, palpitations and leg swelling.  Musculoskeletal:  Positive for myalgias.  Neurological:  Positive for numbness and headaches. Negative for dizziness, weakness and light-headedness.      Objective    BP (!) 116/50 (BP Location: Right Arm, Patient Position: Sitting, Cuff Size: Large)   Pulse (!) 50   Temp 98.3 F (36.8 C) (Oral)   Resp 16   Ht $R'4\' 11"'sM$  (1.499 m)   Wt 147 lb 3.2 oz (66.8 kg)   BMI 29.73 kg/m    Physical Exam   General Appearance:     Overweight female, alert, cooperative, in no acute distress  HENT:   ENT exam normal, no neck nodes or sinus tenderness  Eyes:    PERRL, conjunctiva/corneas clear, EOM's intact       Lungs:     Clear to auscultation bilaterally, respirations unlabored  Heart:    Bradycardic. Normal rhythm.  2/6 systolic murmur at right upper sternal border   Neurologic:   Awake, alert, oriented x 3. No apparent focal neurological           defect.         Assessment & Plan    1. Essential (primary) hypertension Well controlled. Continue current medications.    2. Pressure and pain of right side of face I think this is more c/w trigeminal neuralgia, although some features concerning for sinus disease.  - DG Sinuses  Complete; Future  3. Cervical pain refill gabapentin (NEURONTIN) 800 MG tablet; Take 1 tablet (800 mg total) by mouth 3 (three) times daily.  Dispense: 90 tablet; Refill: 1  4. Common migraine with intractable migraine refill gabapentin (NEURONTIN) 800 MG tablet; Take 1 tablet (800 mg total) by mouth 3 (three) times daily.  Dispense: 90 tablet; Refill: 1  5. Trigeminal neuralgia pain refill- gabapentin (NEURONTIN) 800 MG tablet; Take 1 tablet (800 mg total) by mouth 3 (three) times daily.  Dispense: 90 tablet; Refill: 1  6. Type 2 diabetes mellitus with stage 3a chronic kidney disease, without long-term current use of insulin (HCC) Last a1c was 7.3 on 06/18/2021 with Dr. Honor Junes.  - Urine microalbumin-creatinine with uACR      The entirety of the information documented in the History of  Present Illness, Review of Systems and Physical Exam were personally obtained by me. Portions of this information were initially documented by the CMA and reviewed by me for thoroughness and accuracy.     [name], MD  [facility] [phone] (phone) [phone] (fax)  [place]

## 2021-12-31 ENCOUNTER — Ambulatory Visit (INDEPENDENT_AMBULATORY_CARE_PROVIDER_SITE_OTHER): Payer: Medicare Other | Admitting: Family Medicine

## 2021-12-31 ENCOUNTER — Ambulatory Visit
Admission: RE | Admit: 2021-12-31 | Discharge: 2021-12-31 | Disposition: A | Discharge status: Home / Self Care | Payer: Medicare Other | Source: Ambulatory Visit | Attending: Family Medicine | Admitting: Family Medicine

## 2021-12-31 ENCOUNTER — Ambulatory Visit
Admission: RE | Admit: 2021-12-31 | Discharge: 2021-12-31 | Disposition: A | Discharge status: Home / Self Care | Payer: Medicare Other | Attending: Family Medicine | Admitting: Family Medicine

## 2021-12-31 ENCOUNTER — Encounter: Payer: Self-pay | Admitting: Family Medicine

## 2021-12-31 VITALS — BP 116/50 | HR 50 | Temp 98.3°F | Resp 16 | Ht 59.0 in | Wt 147.2 lb

## 2021-12-31 DIAGNOSIS — G43019 Migraine without aura, intractable, without status migrainosus: Secondary | ICD-10-CM

## 2021-12-31 DIAGNOSIS — I10 Essential (primary) hypertension: Secondary | ICD-10-CM

## 2021-12-31 DIAGNOSIS — G500 Trigeminal neuralgia: Secondary | ICD-10-CM

## 2021-12-31 DIAGNOSIS — M542 Cervicalgia: Secondary | ICD-10-CM

## 2021-12-31 DIAGNOSIS — R519 Headache, unspecified: Secondary | ICD-10-CM

## 2021-12-31 DIAGNOSIS — E1122 Type 2 diabetes mellitus with diabetic chronic kidney disease: Secondary | ICD-10-CM | POA: Diagnosis not present

## 2021-12-31 DIAGNOSIS — N1831 Chronic kidney disease, stage 3a: Secondary | ICD-10-CM

## 2021-12-31 MED ORDER — GABAPENTIN 800 MG PO TABS: 800.0000 mg | ORAL_TABLET | Freq: Three times a day (TID) | ORAL | 1 refills | Status: DC

## 2021-12-31 NOTE — Patient Instructions (Signed)
.   Please review the attached list of medications and notify my office if there are any errors.   . Please contact your eyecare professional to schedule a routine eye exam  

## 2022-01-01 DIAGNOSIS — J301 Allergic rhinitis due to pollen: Secondary | ICD-10-CM | POA: Diagnosis not present

## 2022-01-01 DIAGNOSIS — J3089 Other allergic rhinitis: Secondary | ICD-10-CM | POA: Diagnosis not present

## 2022-01-01 LAB — MICROALBUMIN / CREATININE URINE RATIO
Creatinine, Urine: 176.4 mg/dL
Microalb/Creat Ratio: <2 mg/g creat (ref 0–29)
Microalbumin, Urine: <3 ug/mL

## 2022-01-05 ENCOUNTER — Encounter: Payer: Self-pay | Admitting: Gastroenterology

## 2022-01-05 ENCOUNTER — Ambulatory Visit (INDEPENDENT_AMBULATORY_CARE_PROVIDER_SITE_OTHER): Payer: Medicare Other | Admitting: Gastroenterology

## 2022-01-05 VITALS — BP 148/74 | HR 77 | Temp 99.2°F | Ht 59.0 in | Wt 147.8 lb

## 2022-01-05 DIAGNOSIS — K529 Noninfective gastroenteritis and colitis, unspecified: Secondary | ICD-10-CM | POA: Diagnosis not present
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
## 2022-01-06 DIAGNOSIS — J3089 Other allergic rhinitis: Secondary | ICD-10-CM | POA: Diagnosis not present

## 2022-01-06 DIAGNOSIS — J301 Allergic rhinitis due to pollen: Secondary | ICD-10-CM | POA: Diagnosis not present

## 2022-01-06 DIAGNOSIS — K529 Noninfective gastroenteritis and colitis, unspecified: Secondary | ICD-10-CM | POA: Diagnosis not present

## 2022-01-06 LAB — H. PYLORI BREATH TEST: H pylori Breath Test: NEGATIVE

## 2022-01-07 ENCOUNTER — Telehealth: Payer: Self-pay

## 2022-01-07 LAB — GI PROFILE, STOOL, PCR

## 2022-01-07 NOTE — Telephone Encounter (Signed)
-----   Message from Lin Landsman, MD sent at 01/06/2022  2:45 PM EDT ----- Please inform patient that the H. pylori breath test came back negative.  Please remind her about the stool studies to rule out infection before we proceed with colonoscopy  RV

## 2022-01-07 NOTE — Telephone Encounter (Signed)
LVM for pt to return my call.

## 2022-01-07 NOTE — Telephone Encounter (Signed)
Pt returned my call and results of the H pylori breath test were given. Pt stated she brought the stool test back yesterday. I advised her Dr Verlin Grills nurse will call her with results when they are back.

## 2022-01-08 ENCOUNTER — Other Ambulatory Visit: Payer: Self-pay

## 2022-01-08 ENCOUNTER — Telehealth: Payer: Self-pay

## 2022-01-08 DIAGNOSIS — J3089 Other allergic rhinitis: Secondary | ICD-10-CM | POA: Diagnosis not present

## 2022-01-08 DIAGNOSIS — Z86010 Personal history of colonic polyps: Secondary | ICD-10-CM

## 2022-01-08 DIAGNOSIS — J301 Allergic rhinitis due to pollen: Secondary | ICD-10-CM | POA: Diagnosis not present

## 2022-01-08 DIAGNOSIS — K529 Noninfective gastroenteritis and colitis, unspecified: Secondary | ICD-10-CM

## 2022-01-08 MED ORDER — NA SULFATE-K SULFATE-MG SULF 17.5-3.13-1.6 GM/177ML PO SOLN: 1.0000 | Freq: Once | ORAL | 0 refills | Status: AC

## 2022-01-08 NOTE — Telephone Encounter (Signed)
-----   Message from Lin Landsman, MD sent at 01/07/2022  5:20 PM EDT ----- Regarding: RE: Lab Results Please call her tomorrow and find out because she may not be checking her messages promptly  Thanks RV ----- Message ----- From: Vanetta Mulders, CMA Sent: 01/07/2022   5:18 PM EDT To: Lin Landsman, MD Subject: Lab Results                                    Message sent to patient via mychart.  Thanks, Sharyn Lull ----- Message ----- From: Lin Landsman, MD Sent: 01/07/2022   4:59 PM EDT To: Vanetta Mulders, CMA  Please inform patient that the stool study results came back normal.  Please find out with patient if she would like to undergo colonoscopy with biopsies to rule out microscopic colitis before stopping Ozempic to know the cause for loose stools and urgency  Rohini Vanga

## 2022-01-08 NOTE — Telephone Encounter (Signed)
Contacted patient this morning to inform her that her stool study results came back normal.  She has agreed to have colonoscopy before stopping Ozempic to know the cause for loose stool and urgency.  Colonoscopy has been scheduled for 02/04/22 at [facility] with Dr. Marius Ditch.  Instructions reviewed for colonoscopy rx sent to pharmacy.  Thanks, [place], [place]

## 2022-01-15 DIAGNOSIS — E1159 Type 2 diabetes mellitus with other circulatory complications: Secondary | ICD-10-CM | POA: Diagnosis not present

## 2022-01-15 DIAGNOSIS — I152 Hypertension secondary to endocrine disorders: Secondary | ICD-10-CM | POA: Diagnosis not present

## 2022-01-15 DIAGNOSIS — E1169 Type 2 diabetes mellitus with other specified complication: Secondary | ICD-10-CM | POA: Diagnosis not present

## 2022-01-15 DIAGNOSIS — J3089 Other allergic rhinitis: Secondary | ICD-10-CM | POA: Diagnosis not present

## 2022-01-15 DIAGNOSIS — E785 Hyperlipidemia, unspecified: Secondary | ICD-10-CM | POA: Diagnosis not present

## 2022-01-15 DIAGNOSIS — J301 Allergic rhinitis due to pollen: Secondary | ICD-10-CM | POA: Diagnosis not present

## 2022-01-15 DIAGNOSIS — E1165 Type 2 diabetes mellitus with hyperglycemia: Secondary | ICD-10-CM | POA: Diagnosis not present

## 2022-01-15 LAB — HEMOGLOBIN A1C: Hemoglobin A1C: 6.8

## 2022-01-20 DIAGNOSIS — J301 Allergic rhinitis due to pollen: Secondary | ICD-10-CM | POA: Diagnosis not present

## 2022-01-20 DIAGNOSIS — J3089 Other allergic rhinitis: Secondary | ICD-10-CM | POA: Diagnosis not present

## 2022-01-29 DIAGNOSIS — J3089 Other allergic rhinitis: Secondary | ICD-10-CM | POA: Diagnosis not present

## 2022-01-29 DIAGNOSIS — J301 Allergic rhinitis due to pollen: Secondary | ICD-10-CM | POA: Diagnosis not present

## 2022-02-03 ENCOUNTER — Telehealth: Payer: Self-pay

## 2022-02-03 NOTE — Telephone Encounter (Signed)
She can start drinking bowel prep today if the rash is improving by end of the day  RV

## 2022-02-03 NOTE — Telephone Encounter (Signed)
Patient verbalized understanding and states she will have colonoscopy tomorrow unless something happens tonight

## 2022-02-03 NOTE — Telephone Encounter (Signed)
Patient has a colonoscopy schedule for tomorrow. She states this morning when she woke up she had a raised rash on her thighs and stomach. She states she will sometime wake up with rashes but they are never raised. She states she had tingling her her face and itch all over. She states that everywhere she itch the rash would appear. In 2015 she had something similar to this and actually had a Anaphylaxis. She states she took some Benadryl and Levocetirizine and it has calmed down but still has a raised rash. She states she has not changed any laundry detergents, soaps, foods or medications. She is wanting to know if she should take the prep at 5 and have the colonoscopy tomorrow.

## 2022-02-04 ENCOUNTER — Ambulatory Visit: Payer: Medicare Other | Admitting: Registered Nurse

## 2022-02-04 ENCOUNTER — Ambulatory Visit
Admission: RE | Admit: 2022-02-04 | Discharge: 2022-02-04 | Disposition: A | Discharge status: Home / Self Care | Payer: Medicare Other | Source: Ambulatory Visit | Attending: Gastroenterology | Admitting: Gastroenterology

## 2022-02-04 ENCOUNTER — Encounter: Payer: Self-pay | Admitting: Gastroenterology

## 2022-02-04 ENCOUNTER — Encounter
Admission: RE | Disposition: A | Discharge status: Home / Self Care | Payer: Self-pay | Source: Ambulatory Visit | Attending: Gastroenterology

## 2022-02-04 DIAGNOSIS — Z860100 Personal history of colon polyps, unspecified: Secondary | ICD-10-CM

## 2022-02-04 DIAGNOSIS — D122 Benign neoplasm of ascending colon: Secondary | ICD-10-CM | POA: Insufficient documentation

## 2022-02-04 DIAGNOSIS — K529 Noninfective gastroenteritis and colitis, unspecified: Secondary | ICD-10-CM

## 2022-02-04 DIAGNOSIS — Z87891 Personal history of nicotine dependence: Secondary | ICD-10-CM | POA: Diagnosis not present

## 2022-02-04 DIAGNOSIS — E119 Type 2 diabetes mellitus without complications: Secondary | ICD-10-CM | POA: Insufficient documentation

## 2022-02-04 DIAGNOSIS — K635 Polyp of colon: Secondary | ICD-10-CM | POA: Diagnosis not present

## 2022-02-04 DIAGNOSIS — K644 Residual hemorrhoidal skin tags: Secondary | ICD-10-CM | POA: Insufficient documentation

## 2022-02-04 DIAGNOSIS — R197 Diarrhea, unspecified: Secondary | ICD-10-CM | POA: Diagnosis not present

## 2022-02-04 HISTORY — PX: COLONOSCOPY WITH PROPOFOL: SHX5780

## 2022-02-04 LAB — GLUCOSE, CAPILLARY: Glucose-Capillary: 110 mg/dL — ABNORMAL HIGH (ref 70–99)

## 2022-02-04 SURGERY — COLONOSCOPY WITH PROPOFOL
Anesthesia: General

## 2022-02-04 MED ORDER — SODIUM CHLORIDE 0.9 % IV SOLN: INTRAVENOUS | Status: DC

## 2022-02-04 MED ORDER — LIDOCAINE HCL (CARDIAC) PF 100 MG/5ML IV SOSY
PREFILLED_SYRINGE | INTRAVENOUS | Status: DC | PRN
  Administered 2022-02-04: 50 mg via INTRAVENOUS

## 2022-02-04 MED ORDER — LIDOCAINE HCL (PF) 1 % IJ SOLN
INTRAMUSCULAR | Status: AC
  Filled 2022-02-04: qty 2

## 2022-02-04 MED ORDER — PROPOFOL 500 MG/50ML IV EMUL
INTRAVENOUS | Status: DC | PRN
  Administered 2022-02-04: 125 ug/kg/min via INTRAVENOUS

## 2022-02-04 MED ORDER — PROPOFOL 1000 MG/100ML IV EMUL
INTRAVENOUS | Status: AC
  Filled 2022-02-04: qty 100

## 2022-02-04 MED ORDER — PROPOFOL 10 MG/ML IV BOLUS
INTRAVENOUS | Status: DC | PRN
  Administered 2022-02-04: 70 mg via INTRAVENOUS
  Administered 2022-02-04: 30 mg via INTRAVENOUS

## 2022-02-04 MED ORDER — LIDOCAINE HCL (PF) 2 % IJ SOLN
INTRAMUSCULAR | Status: AC
  Filled 2022-02-04: qty 5

## 2022-02-04 NOTE — Transfer of Care (Signed)
Immediate Anesthesia Transfer of Care Note  Patient: Sharon Nelson  Procedure(s) Performed: COLONOSCOPY WITH PROPOFOL  Patient Location: PACU  Anesthesia Type:General  Level of Consciousness: awake and drowsy  Airway & Oxygen Therapy: Patient Spontanous Breathing  Post-op Assessment: Report given to RN and Post -op Vital signs reviewed and stable  Post vital signs: Reviewed and stable  Last Vitals:  Vitals Value Taken Time  BP 105/61 02/04/22 1020  Temp    Pulse 71 02/04/22 1020  Resp 13 02/04/22 1020  SpO2 100 % 02/04/22 1020    Last Pain:  Vitals:   02/04/22 0835  TempSrc: Temporal  PainSc: 0-No pain         Complications: No notable events documented.
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
## 2022-02-04 NOTE — Anesthesia Postprocedure Evaluation (Signed)
Anesthesia Post Note  Patient: JAIDALYN SCHILLO  Procedure(s) Performed: COLONOSCOPY WITH PROPOFOL  Patient location during evaluation: Endoscopy Anesthesia Type: General Level of consciousness: awake and alert Pain management: pain level controlled Vital Signs Assessment: post-procedure vital signs reviewed and stable Respiratory status: spontaneous breathing, nonlabored ventilation, respiratory function stable and patient connected to nasal cannula oxygen Cardiovascular status: blood pressure returned to baseline and stable Postop Assessment: no apparent nausea or vomiting Anesthetic complications: no   No notable events documented.   Last Vitals:  Vitals:   02/04/22 1019 02/04/22 1020  BP: 105/61 105/61  Pulse:  71  Resp:  13  Temp: (!) 36.2 C   SpO2:  100%    Last Pain:  Vitals:   02/04/22 1039  TempSrc:   PainSc: 0-No pain                 Arita Miss

## 2022-02-04 NOTE — Progress Notes (Signed)
Spoke with endo regarding consult. It was relayed to this write that anesthesia is currently working on access. Instructed to reconsult if unsuccessful.

## 2022-02-04 NOTE — Op Note (Signed)
[facility] Patient Name: Sharon Nelson Procedure Date: 02/04/2022 8:55 AM MRN: [id number] Account #: [id number] Date of Birth: 07-18-1951 Admit Type: Outpatient Age: 71 Room: [facility] ENDO ROOM 4 Gender: Female Note Status: Finalized Instrument Name: Colonoscope [id number] Procedure:             Colonoscopy Indications:           Last colonoscopy: October 2019, Chronic diarrhea,                         Clinically significant diarrhea of unexplained origin Providers:             Lin Landsman MD, MD Referring MD:          Kirstie Peri. Caryn Section, MD (Referring MD) Medicines:             General Anesthesia Complications:         No immediate complications. Estimated blood loss: None. Procedure:             Pre-Anesthesia Assessment:                        - Prior to the procedure, a History and Physical was                         performed, and patient medications and allergies were                         reviewed. The patient is competent. The risks and                         benefits of the procedure and the sedation options and                         risks were discussed with the patient. All questions                         were answered and informed consent was obtained.                         Patient identification and proposed procedure were                         verified by the physician, the nurse, the                         anesthesiologist, the anesthetist and the technician                         in the pre-procedure area in the procedure room in the                         endoscopy suite. Mental Status Examination: alert and                         oriented. Airway Examination: normal oropharyngeal                         airway and neck mobility. Respiratory Examination:  clear to auscultation. CV Examination: normal.                         Prophylactic Antibiotics: The patient does not require                          prophylactic antibiotics. Prior Anticoagulants: The                         patient has taken no previous anticoagulant or                         antiplatelet agents. ASA Grade Assessment: II - A                         patient with mild systemic disease. After reviewing                         the risks and benefits, the patient was deemed in                         satisfactory condition to undergo the procedure. The                         anesthesia plan was to use general anesthesia.                         Immediately prior to administration of medications,                         the patient was re-assessed for adequacy to receive                         sedatives. The heart rate, respiratory rate, oxygen                         saturations, blood pressure, adequacy of pulmonary                         ventilation, and response to care were monitored                         throughout the procedure. The physical status of the                         patient was re-assessed after the procedure.                        After obtaining informed consent, the colonoscope was                         passed under direct vision. Throughout the procedure,                         the patient's blood pressure, pulse, and oxygen                         saturations were monitored continuously. The  Colonoscope was introduced through the anus and                         advanced to the the cecum, identified by appendiceal                         orifice and ileocecal valve. The colonoscopy was                         performed without difficulty. The patient tolerated                         the procedure well. The quality of the bowel                         preparation was evaluated using the BBPS [facility] Bowel                         Preparation Scale) with scores of: Right Colon = 3,                         Transverse Colon = 3 and Left Colon = 3 (entire mucosa                          seen well with no residual staining, small fragments                         of stool or opaque liquid). The total BBPS score                         equals 9. Findings:      The perianal and digital rectal examinations were normal. Pertinent       negatives include normal sphincter tone and no palpable rectal lesions.      A 9 mm polyp was found in the ascending colon. The polyp was sessile.       The polyp was removed with a cold snare. Resection and retrieval were       complete. To prevent bleeding after the polypectomy, two hemostatic       clips were successfully placed. There was no bleeding at the end of the       procedure.      Normal mucosa was found in the entire colon. Biopsies for histology were       taken with a cold forceps from the entire colon for evaluation of       microscopic colitis.      Non-bleeding external hemorrhoids were found during retroflexion. The       hemorrhoids were small. Impression:            - One 9 mm polyp in the ascending colon, removed with                         a cold snare. Resected and retrieved. Clips were                         placed.                        -  Normal mucosa in the entire examined colon. Biopsied.                        - Non-bleeding external hemorrhoids. Recommendation:        - Repeat colonoscopy in 5 years for surveillance.                        - Discharge patient to home (with escort).                        - Resume previous diet today.                        - Await pathology results. Procedure Code(s):     --- Professional ---                        [phone], Colonoscopy, flexible; with removal of                         tumor(s), polyp(s), or other lesion(s) by snare                         technique                        45380, 69, Colonoscopy, flexible; with biopsy, single                         or multiple Diagnosis Code(s):     --- Professional ---                        K63.5, Polyp  of colon                        K64.4, Residual hemorrhoidal skin tags                        K52.9, Noninfective gastroenteritis and colitis,                         unspecified                        R19.7, Diarrhea, unspecified CPT copyright 2019 American Medical Association. All rights reserved. The codes documented in this report are preliminary and upon coder review may  be revised to meet current compliance requirements. Dr. Ulyess Mort Lin Landsman MD, MD 02/04/2022 10:19:01 AM This report has been signed electronically. Number of Addenda: 0 Note Initiated On: 02/04/2022 8:55 AM Scope Withdrawal Time: 0 hours 17 minutes 54 seconds  Total Procedure Duration: 0 hours 21 minutes 48 seconds  Estimated Blood Loss:  Estimated blood loss: none.      [facility]

## 2022-02-04 NOTE — Anesthesia Preprocedure Evaluation (Signed)
Anesthesia Evaluation  Patient identified by MRN, date of birth, ID band Patient awake  General Assessment Comment: Took ozempic injection 6 days ago. Does not feel bloated or nauseated currently.   Reviewed: Allergy & Precautions, NPO status , Patient's Chart, lab work & pertinent test results  History of Anesthesia Complications (+) history of anesthetic complications (told labored breathing under anesthesia)  Airway Mallampati: II  TM Distance: >3 FB Neck ROM: full    Dental  (+) Teeth Intact   Pulmonary asthma , neg sleep apnea, neg COPD, Patient abstained from smoking.Not current smoker, former smoker,  Hx asthma, with trouble breathing after prior surgeries. Has not had issues after colonoscopies. Took her inhalers last night and this morning. Feels well from breathing standpoint   Pulmonary exam normal breath sounds clear to auscultation       Cardiovascular Exercise Tolerance: Good METShypertension, Pt. 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);  R Trigeminal neuralgia pain; negative psych ROS   GI/Hepatic Neg liver ROS, neg GERD  ,IBS   Endo/Other  diabetesMorbid obesity (bmi=32)  Renal/GU negative Renal ROS  negative genitourinary   Musculoskeletal  (+) Arthritis ,   Abdominal   Peds  Hematology negative hematology ROS (+)   Anesthesia Other Findings Past Medical History: 03/29/2015: Allergic shock     Comment:  Food versus ACEI (01/2014)  No date: Aneurysm ([place])     Comment:  coil placed above temple 2005: Aneurysm, cerebral     Comment:  has coil in temple (per pt) No date: Arthritis     Comment:  neck, shoulders No date: Asthma 2007: Colon polyp No date: Complication of anesthesia     Comment:  told labored  breathing under anesthesia No date: Diabetes ([place]) No date: Dysrhythmia No date: Helicobacter pylori ab+ 19/50/9326: Helicobacter pylori gastritis (chronic gastritis) 11/11/2021: History of breast biopsy     Comment:  U/S Core Bx Right breast, heart clip- path pending No date: History of chicken pox No date: Hypertension No date: Migraines     Comment:  controlled on spironolactone No date: Neuromuscular disorder ([place])     Comment:  shoulder issues No date: Shingles  Reproductive/Obstetrics                             Anesthesia Physical  Anesthesia Plan  ASA: 2  Anesthesia Plan: General   Post-op Pain Management: Minimal or no pain anticipated   Induction: Intravenous  PONV Risk Score and Plan: 2 and 3 and TIVA and Ondansetron  Airway Management Planned: Nasal Cannula  Additional Equipment: None  Intra-op Plan:   Post-operative Plan:   Informed Consent: I have reviewed the patients History and Physical, chart, labs and discussed the procedure including the risks, benefits and alternatives for the proposed anesthesia with the patient or authorized representative who has indicated his/her understanding and acceptance.     Dental advisory given  Plan Discussed with: CRNA  Anesthesia Plan Comments: (Discussed risks of anesthesia with patient, including possibility of difficulty with spontaneous ventilation under anesthesia necessitating airway intervention, PONV, and rare risks such as cardiac or respiratory or neurological events, and allergic reactions. Discussed the role of CRNA in patient's perioperative care. Patient understands.)        Anesthesia Quick Evaluation

## 2022-02-05 LAB — SURGICAL PATHOLOGY

## 2022-02-06 ENCOUNTER — Encounter: Payer: Self-pay | Admitting: Gastroenterology

## 2022-02-09 ENCOUNTER — Telehealth: Payer: Self-pay

## 2022-02-09 DIAGNOSIS — K529 Noninfective gastroenteritis and colitis, unspecified: Secondary | ICD-10-CM

## 2022-02-09 NOTE — Telephone Encounter (Signed)
Called and left a message for call back  

## 2022-02-09 NOTE — Telephone Encounter (Signed)
-----   Message from Lin Landsman, MD sent at 02/06/2022 12:20 PM EDT ----- Pathology results from colonoscopy came back negative for any inflammation.  Recommend to check pancreatic fecal elastase levels but history of chronic unexplained diarrhea  RV

## 2022-02-09 NOTE — Telephone Encounter (Signed)
Patient verbalized understanding of results  

## 2022-02-11 DIAGNOSIS — K529 Noninfective gastroenteritis and colitis, unspecified: Secondary | ICD-10-CM | POA: Diagnosis not present

## 2022-02-12 DIAGNOSIS — J4530 Mild persistent asthma, uncomplicated: Secondary | ICD-10-CM | POA: Diagnosis not present

## 2022-02-12 DIAGNOSIS — J3089 Other allergic rhinitis: Secondary | ICD-10-CM | POA: Diagnosis not present

## 2022-02-12 DIAGNOSIS — L501 Idiopathic urticaria: Secondary | ICD-10-CM | POA: Diagnosis not present

## 2022-02-12 DIAGNOSIS — L2089 Other atopic dermatitis: Secondary | ICD-10-CM | POA: Diagnosis not present

## 2022-02-13 ENCOUNTER — Telehealth: Payer: Self-pay

## 2022-02-13 NOTE — Telephone Encounter (Signed)
Copied from [place] [id number]. Topic: Referral - Request for Referral >> Feb 13, 2022  9:35 AM Cyndi Bender wrote: Has patient seen PCP for this complaint? Yes.   *If NO, is insurance requiring patient see PCP for this issue before PCP can refer them? Referral for which specialty: Dermatology Preferred provider/office: no specific provider or location Reason for referral: Pt was seen by an allergist that ruled out allergies and medications as the issue and suggested pt get referral to dermatologist

## 2022-02-16 LAB — PANCREATIC ELASTASE, FECAL: Pancreatic Elastase, Fecal: 273 ug Elast./g (ref >200)

## 2022-02-17 ENCOUNTER — Telehealth: Payer: Self-pay

## 2022-02-17 MED ORDER — RIFAXIMIN 550 MG PO TABS: 550.0000 mg | ORAL_TABLET | Freq: Three times a day (TID) | ORAL | 0 refills | Status: AC

## 2022-02-17 NOTE — Telephone Encounter (Signed)
Patient verbalized understanding of results. Sent medication to the pharmacy  

## 2022-02-17 NOTE — Telephone Encounter (Signed)
-----   Message from Lin Landsman, MD sent at 02/17/2022  3:26 PM EDT ----- Please inform patient that the work-up for loose stools thus far came back unremarkable.  Recommend 2 weeks course of Xifaxan 550 mg 3 times daily   RV

## 2022-02-18 ENCOUNTER — Other Ambulatory Visit: Payer: Self-pay | Admitting: Family Medicine

## 2022-02-19 ENCOUNTER — Telehealth: Payer: Self-pay

## 2022-02-19 ENCOUNTER — Telehealth: Payer: Self-pay | Admitting: Family Medicine

## 2022-02-19 NOTE — Telephone Encounter (Signed)
Copied from [place] [id number]. Topic: Referral - Request for Referral >> Feb 13, 2022  9:35 AM Cyndi Bender wrote: Has patient seen PCP for this complaint? Yes.   *If NO, is insurance requiring patient see PCP for this issue before PCP can refer them? Referral for which specialty: Dermatology Preferred provider/office: no specific provider or location Reason for referral: Pt was seen by an allergist that ruled out allergies and medications as the issue and suggested pt get referral to dermatologist >> Feb 19, 2022  2:22 PM [place] B wrote: Patient called in about status of referral for Dermatologist . She hasnt heard back yet. See crm put in 07/21

## 2022-02-20 NOTE — Telephone Encounter (Signed)
Patient requesting Derm referral due to itchy bumps on arms, chest and neck x 3 wks.  seen by an allergist that ruled out allergies and medications as the issue and suggested pt get referral to dermatologist.

## 2022-02-23 NOTE — Telephone Encounter (Signed)
Okay to send Derm referral?

## 2022-02-23 NOTE — Telephone Encounter (Signed)
Attempted to reach pt.  Voicemail full. If pt calls back Okay for [facility] triage to advise that she  can make a dermatology appt on her own or if she wants a referral we will need to see her for the problem in order for [place] to have office notes to provide with referral.

## 2022-02-24 NOTE — Telephone Encounter (Signed)
Patient called, no answer, voicemail full.

## 2022-02-24 NOTE — Telephone Encounter (Signed)
Patient called, no answer on either phone, unable to leave a message on cell phone due to mailbox is full.

## 2022-03-10 ENCOUNTER — Telehealth: Payer: Self-pay | Admitting: Family Medicine

## 2022-03-10 DIAGNOSIS — L989 Disorder of the skin and subcutaneous tissue, unspecified: Secondary | ICD-10-CM

## 2022-03-10 NOTE — Telephone Encounter (Signed)
Referral Request - Has patient seen PCP for this complaint? She talk to Dr fisher\'s nurse *If NO, is insurance requiring patient see PCP for this issue before PCP can refer them? Referral for which specialty: Dermatologist  Preferred provider/office: Dr Kirkland [facility] [place] Reason for referral: break out blotches on both arms

## 2022-04-09 ENCOUNTER — Ambulatory Visit: Payer: Medicare Other | Admitting: Gastroenterology

## 2022-04-10 ENCOUNTER — Telehealth: Payer: Self-pay

## 2022-04-10 NOTE — Telephone Encounter (Signed)
Copied from [place] [phone]. Topic: Referral - Status >> Apr 10, 2022 11:23 AM Penni Bombard wrote: Reason for CRM: Pt called saying she was referred to dermatology and they can not see her until Dec or March.  She needs to be seen before that because she takes allergy shots and they will not give her shots until she is clear.  CB@  [phone]

## 2022-05-01 ENCOUNTER — Other Ambulatory Visit: Payer: Self-pay | Admitting: Family Medicine

## 2022-05-01 DIAGNOSIS — M542 Cervicalgia: Secondary | ICD-10-CM

## 2022-05-01 DIAGNOSIS — G500 Trigeminal neuralgia: Secondary | ICD-10-CM

## 2022-05-01 DIAGNOSIS — G43019 Migraine without aura, intractable, without status migrainosus: Secondary | ICD-10-CM

## 2022-05-14 ENCOUNTER — Ambulatory Visit: Payer: Self-pay | Admitting: *Deleted

## 2022-05-14 DIAGNOSIS — Z202 Contact with and (suspected) exposure to infections with a predominantly sexual mode of transmission: Secondary | ICD-10-CM | POA: Diagnosis not present

## 2022-05-14 NOTE — Telephone Encounter (Signed)
Attempted to return her call.   Left a voicemail to call back to discuss symptoms with a nurse. 

## 2022-05-14 NOTE — Telephone Encounter (Signed)
Message from [place] sent at 05/14/2022 11:39 AM EDT  Summary: std question   Patient called in ,concerned about std  didn't mention symptoms , but says she only has on e partner and he has been exposed, and she wants to know other ways this can be caught.           Call History   Type Contact Phone/Fax User  05/14/2022 11:37 AM EDT Phone (Incoming) [name] (Self) [phone] Jerilynn Mages) [place], [place]

## 2022-05-14 NOTE — Telephone Encounter (Signed)
Reason for Disposition  Trichomonas Infections, questions about  Answer Assessment - Initial Assessment Questions 1. STI EXPOSURE: "Were you exposed to an STI?" If so, ask: "What kind of contact did you have?" "When did that happen?"     I have a friend we are sexually active.   His nurse called him and told him that he has a sexually transmitted disease.     2. TYPE of STI: "Do you know what type of STI (their diagnosis) the other person has?"    Trichomonades  3. STI SYMPTOMS: "Do you have any symptoms of a STI?" If so, ask: "What are they?"     No symptoms 4. QUESTIONS ABOUT STIs:   "Do you have questions about a particular STI?"     Trich. 5. PREVENTION: "Do you have questions about preventing AIDS and other STIs?"     *No Answer*  Protocols used: STI Exposure or Questions-P-AH  Chief Complaint: Her female friend that she is sexually active with was diagnosed with trichomonas by his dr.  She was advised to be tested also and if positive be treated.    Symptoms: No symptoms at all Frequency: N/A Pertinent Negatives: Patient denies vaginal discharge, odor, itching, redness, irritation.   Disposition: '[]'$ ED /'[x]'$ Urgent Care (no appt availability in office) / '[]'$ Appointment(In office/virtual)/ '[]'$  [place] [facility]/ '[]'$ Home Care/ '[]'$ Refused Recommended Disposition /'[]'$ [place] [facility]/ '[]'$  Follow-up with PCP Additional Notes: She prefers to go on to the urgent care since there are no appts with [facility] so she can be tested as soon as possible.   I answered her questions using the Care Advice.

## 2022-05-28 DIAGNOSIS — Z202 Contact with and (suspected) exposure to infections with a predominantly sexual mode of transmission: Secondary | ICD-10-CM | POA: Diagnosis not present

## 2022-05-29 ENCOUNTER — Other Ambulatory Visit: Payer: Self-pay | Admitting: Family Medicine

## 2022-06-01 NOTE — Telephone Encounter (Signed)
Requested medication (s) are due for refill today: yes  Requested medication (s) are on the active medication list: yes  Last refill:  02/18/22 #90  Future visit scheduled: yes  Notes to clinic:  overdue lab work   Requested Prescriptions  Pending Prescriptions Disp Refills   furosemide (LASIX) 20 MG tablet [Pharmacy Med Name: FUROSEMIDE '20MG'$  TABLETS] 90 tablet 0    Sig: TAKE 1 TABLET(20 MG) BY MOUTH DAILY AS NEEDED FOR SWELLING     Cardiovascular:  Diuretics - Loop Failed - 05/29/2022  7:29 PM      Failed - K in normal range and within 180 days    Potassium  Date Value Ref Range Status  09/17/2021 4.7 3.5 - 5.2 mmol/L Final  02/12/2014 4.4 3.5 - 5.1 mmol/L Final         Failed - Ca in normal range and within 180 days    Calcium  Date Value Ref Range Status  09/17/2021 9.6 8.7 - 10.3 mg/dL Final   Calcium, Total  Date Value Ref Range Status  02/12/2014 8.0 (L) 8.5 - 10.1 mg/dL Final         Failed - Na in normal range and within 180 days    Sodium  Date Value Ref Range Status  09/17/2021 137 134 - 144 mmol/L Final  02/12/2014 142 136 - 145 mmol/L Final         Failed - Cr in normal range and within 180 days    Creatinine  Date Value Ref Range Status  02/12/2014 1.04 0.60 - 1.30 mg/dL Final   Creatinine, Ser  Date Value Ref Range Status  09/17/2021 1.20 (H) 0.57 - 1.00 mg/dL Final   Creatinine, POC  Date Value Ref Range Status  05/15/2019 n/a mg/dL Final         Failed - Cl in normal range and within 180 days    Chloride  Date Value Ref Range Status  09/17/2021 100 96 - 106 mmol/L Final  02/12/2014 112 (H) 98 - 107 mmol/L Final         Failed - Mg Level in normal range and within 180 days    Magnesium  Date Value Ref Range Status  02/11/2014 2.2 mg/dL Final    Comment:    1.8-2.4 THERAPEUTIC RANGE: 4-7 mg/dL TOXIC: > 10 mg/dL  -----------------------          Passed - Last BP in normal range    BP Readings from Last 1 Encounters:  02/04/22  105/61         Passed - Valid encounter within last 6 months    Recent Outpatient Visits           5 months ago Essential (primary) hypertension   [facility] Birdie Sons, MD   8 months ago Mass of lower outer quadrant of right breast   [facility] Birdie Sons, MD   9 months ago Bacterial sinusitis   [facility] Gwyneth Sprout, FNP   1 year ago Essential (primary) hypertension   [facility] Birdie Sons, MD   1 year ago Essential (primary) hypertension   [place], Kirstie Peri, MD       Future Appointments             In 1 month Fisher, Kirstie Peri, MD [facility], [place]   In 2 months Vanga, Tally Due, MD [facility] [place]

## 2022-06-14 ENCOUNTER — Other Ambulatory Visit: Payer: Self-pay | Admitting: Family Medicine

## 2022-06-14 DIAGNOSIS — N951 Menopausal and female climacteric states: Secondary | ICD-10-CM

## 2022-07-02 NOTE — Progress Notes (Signed)
I,April Miller,acting as a scribe for [name], MD.,have documented all relevant documentation on the behalf of [name], MD,as directed by  [name], MD while in the presence of [name], MD.   Established patient visit   Patient: Sharon Nelson   DOB: 1951/04/22   71 y.o. Female  MRN: [id number] Visit Date: 07/03/2022  Today's healthcare provider: [name], MD   Chief Complaint  Patient presents with   Follow-up   Hypertension   Subjective    HPI  Hypertension, follow-up  BP Readings from Last 3 Encounters:  07/03/22 (!) 146/62  02/04/22 105/61  01/05/22 (!) 148/74   Wt Readings from Last 3 Encounters:  07/03/22 145 lb (65.8 kg)  02/04/22 145 lb (65.8 kg)  01/05/22 147 lb 12.8 oz (67 kg)     She was last seen for hypertension 5 months ago.  Management since that visit includes; Well controlled. Continue current medications.    Outside blood pressures are not checking.  Pertinent labs Lab Results  Component Value Date   CHOL 180 09/17/2021   HDL 43 09/17/2021   LDLCALC 113 (H) 09/17/2021   TRIG 133 09/17/2021   CHOLHDL 4.2 09/17/2021   Lab Results  Component Value Date   NA 137 09/17/2021   K 4.7 09/17/2021   CREATININE 1.20 (H) 09/17/2021   EGFR 48 (L) 09/17/2021   GLUCOSE 109 (H) 09/17/2021   TSH 0.965 10/30/2020     The 10-year ASCVD risk score (Arnett DK, et al., 2019) is: 53.1%  --------------------------------------------------------------------------------------------------- Continues follow up with Dr. Honor Junes for diabetes with next follow up scheduled in January   Medications: Outpatient Medications Prior to Visit  Medication Sig   ADVAIR HFA 115-21 MCG/ACT inhaler Inhale 2 puffs into the lungs 2 (two) times daily.    albuterol (VENTOLIN HFA) 108 (90 Base) MCG/ACT inhaler Inhale into the lungs every 4 (four) hours as needed.    aspirin 81 MG tablet Take 81 mg by mouth daily.    Cholecalciferol 2000 UNITS CAPS  Take by mouth daily.    Continuous Blood Gluc Receiver (FREESTYLE LIBRE 2 READER) DEVI USE 1 DEVICE AS DIRECTED   diphenhydrAMINE (BENADRYL ALLERGY) 25 mg capsule Take 25 mg by mouth every 6 (six) hours as needed.   donepezil (ARICEPT) 10 MG tablet TAKE 1 TABLET(10 MG) BY MOUTH DAILY   EPINEPHrine 0.3 mg/0.3 mL IJ SOAJ injection    estradiol (ESTRACE) 0.5 MG tablet TAKE 1 TABLET(0.5 MG) BY MOUTH EVERY OTHER DAY   furosemide (LASIX) 20 MG tablet TAKE 1 TABLET(20 MG) BY MOUTH DAILY AS NEEDED FOR SWELLING   gabapentin (NEURONTIN) 800 MG tablet TAKE 1 TABLET(800 MG) BY MOUTH THREE TIMES DAILY   glucose blood (ACCU-CHEK AVIVA PLUS) test strip Use as instructed   levocetirizine (XYZAL) 5 MG tablet Take 5 mg by mouth every evening.    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 REMOVE AND DISCARD AFTER 12 HOURS OR AS DIRECTED BY DOCTOR   rosuvastatin (CRESTOR) 5 MG tablet TAKE 1 TABLET(5 MG) BY MOUTH DAILY. REPLACES LOVASTATIN   Semaglutide, 1 MG/DOSE, 2 MG/1.5ML SOPN Inject 2 mg into the skin once a week. Mondays   spironolactone (ALDACTONE) 100 MG tablet TAKE 1 TABLET(100 MG) BY MOUTH DAILY   traMADol (ULTRAM) 50 MG tablet Take 1 every 4-6 hours as needed for pain not controlled by Tylenol   No facility-administered medications prior to visit.  Review of Systems  Constitutional:  Negative for appetite change, chills, fatigue and fever.  Respiratory:  Negative for chest tightness and shortness of breath.   Cardiovascular:  Negative for chest pain and palpitations.  Gastrointestinal:  Negative for abdominal pain, nausea and vomiting.  Neurological:  Negative for dizziness and weakness.       Objective    BP (!) 146/62 (BP Location: Right Arm, Patient Position: Sitting, Cuff Size: Normal)   Pulse 64   Resp 16   Ht _0  (1.499 m)   Wt 145 lb (65.8 kg)   SpO2 97%   BMI 29.29 kg/m    Physical Exam   General: Appearance:    Well developed,  well nourished female in no acute distress  Eyes:    PERRL, conjunctiva/corneas clear, EOM's intact       Lungs:     Clear to auscultation bilaterally, respirations unlabored  Heart:    Normal heart rate. Normal rhythm.  2/6 crescendo-decrescendo, systolic murmur at right upper sternal border   MS:   All extremities are intact.    Neurologic:   Awake, alert, oriented x 3. No apparent focal neurological defect.         Assessment & Plan     1. Essential (primary) hypertension Not at goal, but anxious today due to drive to doctor's appointment. Advised to check at home occasionally. Continue current medications.    2. Hypercholesteremia She is tolerating rosuvastatin well with no adverse effects.   - CBC - Comprehensive metabolic panel - Lipid panel - TSH  3. Vitamin D deficiency  - VITAMIN D 25 Hydroxy (Vit-D Deficiency, Fractures)  4. Type 2 diabetes mellitus with other specified complication, without long-term current use of insulin ([place]) Followed by Dr. Honor Junes. Well controlled.    5. Estrogen deficiency  - DG Bone density [place]; Future  6. Need for COVID-19 vaccine  - PFIZER Comirnaty(GRAY TOP)COVID-19 Vaccine  7. Need for immunization against influenza  - Flu Vaccine QUAD High Dose(Fluad)      The entirety of the information documented in the History of Present Illness, Review of Systems and Physical Exam were personally obtained by me. Portions of this information were initially documented by the CMA and reviewed by me for thoroughness and accuracy.     [name], MD  [facility] [phone] (phone) [phone] (fax)  [place]

## 2022-07-03 ENCOUNTER — Ambulatory Visit (INDEPENDENT_AMBULATORY_CARE_PROVIDER_SITE_OTHER): Payer: Medicare Other | Admitting: Family Medicine

## 2022-07-03 ENCOUNTER — Encounter: Payer: Self-pay | Admitting: Family Medicine

## 2022-07-03 VITALS — BP 146/62 | HR 64 | Resp 16 | Ht 59.0 in | Wt 145.0 lb

## 2022-07-03 DIAGNOSIS — I10 Essential (primary) hypertension: Secondary | ICD-10-CM

## 2022-07-03 DIAGNOSIS — Z23 Encounter for immunization: Secondary | ICD-10-CM | POA: Diagnosis not present

## 2022-07-03 DIAGNOSIS — E2839 Other primary ovarian failure: Secondary | ICD-10-CM | POA: Diagnosis not present

## 2022-07-03 DIAGNOSIS — E7800 Pure hypercholesterolemia, unspecified: Secondary | ICD-10-CM

## 2022-07-03 DIAGNOSIS — E1169 Type 2 diabetes mellitus with other specified complication: Secondary | ICD-10-CM | POA: Diagnosis not present

## 2022-07-03 DIAGNOSIS — E559 Vitamin D deficiency, unspecified: Secondary | ICD-10-CM | POA: Diagnosis not present

## 2022-07-04 LAB — COMPREHENSIVE METABOLIC PANEL
ALT: 17 IU/L (ref 0–32)
AST: 20 IU/L (ref 0–40)
Albumin/Globulin Ratio: 1.3 (ref 1.2–2.2)
Albumin: 4.4 g/dL (ref 3.8–4.8)
Alkaline Phosphatase: 84 IU/L (ref 44–121)
BUN/Creatinine Ratio: 15 (ref 12–28)
BUN: 19 mg/dL (ref 8–27)
Bilirubin Total: 0.3 mg/dL (ref 0.0–1.2)
CO2: 24 mmol/L (ref 20–29)
Calcium: 9.9 mg/dL (ref 8.7–10.3)
Chloride: 99 mmol/L (ref 96–106)
Creatinine, Ser: 1.31 mg/dL — ABNORMAL HIGH (ref 0.57–1.00)
Globulin, Total: 3.4 g/dL (ref 1.5–4.5)
Glucose: 84 mg/dL (ref 70–99)
Potassium: 4.5 mmol/L (ref 3.5–5.2)
Sodium: 139 mmol/L (ref 134–144)
Total Protein: 7.8 g/dL (ref 6.0–8.5)
eGFR: 43 mL/min/{1.73_m2} — ABNORMAL LOW (ref >59)

## 2022-07-04 LAB — CBC
Hematocrit: 36.6 % (ref 34.0–46.6)
Hemoglobin: 12 g/dL (ref 11.1–15.9)
MCH: 27.5 pg (ref 26.6–33.0)
MCHC: 32.8 g/dL (ref 31.5–35.7)
MCV: 84 fL (ref 79–97)
Platelets: 292 10*3/uL (ref 150–450)
RBC: 4.36 x10E6/uL (ref 3.77–5.28)
RDW: 13.3 % (ref 11.7–15.4)
WBC: 6.6 10*3/uL (ref 3.4–10.8)

## 2022-07-04 LAB — LIPID PANEL
Chol/HDL Ratio: 3.7 ratio (ref 0.0–4.4)
Cholesterol, Total: 172 mg/dL (ref 100–199)
HDL: 47 mg/dL (ref >39)
LDL Chol Calc (NIH): 98 mg/dL (ref 0–99)
Triglycerides: 154 mg/dL — ABNORMAL HIGH (ref 0–149)
VLDL Cholesterol Cal: 27 mg/dL (ref 5–40)

## 2022-07-04 LAB — VITAMIN D 25 HYDROXY (VIT D DEFICIENCY, FRACTURES): Vit D, 25-Hydroxy: 66.8 ng/mL (ref 30.0–100.0)

## 2022-07-04 LAB — TSH: TSH: 1.99 u[IU]/mL (ref 0.450–4.500)

## 2022-07-15 DIAGNOSIS — E113291 Type 2 diabetes mellitus with mild nonproliferative diabetic retinopathy without macular edema, right eye: Secondary | ICD-10-CM | POA: Diagnosis not present

## 2022-07-21 LAB — HM DIABETES EYE EXAM

## 2022-07-28 DIAGNOSIS — E1159 Type 2 diabetes mellitus with other circulatory complications: Secondary | ICD-10-CM | POA: Diagnosis not present

## 2022-07-28 DIAGNOSIS — I152 Hypertension secondary to endocrine disorders: Secondary | ICD-10-CM | POA: Diagnosis not present

## 2022-07-28 DIAGNOSIS — E1165 Type 2 diabetes mellitus with hyperglycemia: Secondary | ICD-10-CM | POA: Diagnosis not present

## 2022-07-28 DIAGNOSIS — E1169 Type 2 diabetes mellitus with other specified complication: Secondary | ICD-10-CM | POA: Diagnosis not present

## 2022-07-28 DIAGNOSIS — E785 Hyperlipidemia, unspecified: Secondary | ICD-10-CM | POA: Diagnosis not present

## 2022-07-28 LAB — HEMOGLOBIN A1C: Hemoglobin A1C: 7

## 2022-07-30 DIAGNOSIS — J3089 Other allergic rhinitis: Secondary | ICD-10-CM | POA: Diagnosis not present

## 2022-07-30 DIAGNOSIS — J4530 Mild persistent asthma, uncomplicated: Secondary | ICD-10-CM | POA: Diagnosis not present

## 2022-07-30 DIAGNOSIS — L501 Idiopathic urticaria: Secondary | ICD-10-CM | POA: Diagnosis not present

## 2022-07-30 DIAGNOSIS — L2089 Other atopic dermatitis: Secondary | ICD-10-CM | POA: Diagnosis not present

## 2022-08-11 ENCOUNTER — Other Ambulatory Visit: Payer: Self-pay | Admitting: Family Medicine

## 2022-08-11 DIAGNOSIS — R413 Other amnesia: Secondary | ICD-10-CM

## 2022-08-11 NOTE — Telephone Encounter (Signed)
Requested medication (s) are due for refill today: expired medication  Requested medication (s) are on the active medication list: yes  Last refill:  07/15/21 #90 4 refills  Future visit scheduled: yes tomorrow  Notes to clinic:  expired medication. Do you want to renew Rx?     Requested Prescriptions  Pending Prescriptions Disp Refills   donepezil (ARICEPT) 10 MG tablet [Pharmacy Med Name: DONEPEZIL '10MG'$  TABLETS] 90 tablet 4    Sig: TAKE 1 TABLET(10 MG) BY MOUTH DAILY     Neurology:  Alzheimer's Agents Passed - 08/11/2022  3:40 AM      Passed - Valid encounter within last 6 months    Recent Outpatient Visits           1 month ago Essential (primary) hypertension   [facility] Birdie Sons, MD   7 months ago Essential (primary) hypertension   [facility] Birdie Sons, MD   10 months ago Mass of lower outer quadrant of right breast   [facility] Birdie Sons, MD   11 months ago Bacterial sinusitis   [facility] Gwyneth Sprout, FNP   1 year ago Essential (primary) hypertension   [facility], Kirstie Peri, MD       Future Appointments             Tomorrow Caryn Section, Kirstie Peri, MD [facility], [place]   In 1 week Vanga, Tally Due, MD [facility] [place]

## 2022-08-12 ENCOUNTER — Ambulatory Visit (INDEPENDENT_AMBULATORY_CARE_PROVIDER_SITE_OTHER): Payer: Medicare Other | Admitting: Family Medicine

## 2022-08-12 ENCOUNTER — Encounter: Payer: Self-pay | Admitting: Family Medicine

## 2022-08-12 VITALS — BP 134/60 | HR 62 | Temp 98.6°F | Wt 147.5 lb

## 2022-08-12 DIAGNOSIS — N1832 Chronic kidney disease, stage 3b: Secondary | ICD-10-CM | POA: Diagnosis not present

## 2022-08-12 DIAGNOSIS — I10 Essential (primary) hypertension: Secondary | ICD-10-CM | POA: Diagnosis not present

## 2022-08-12 DIAGNOSIS — E1122 Type 2 diabetes mellitus with diabetic chronic kidney disease: Secondary | ICD-10-CM

## 2022-08-12 NOTE — Progress Notes (Signed)
I,Connie R Striblin,acting as a Education administrator for [name], MD.,have documented all relevant documentation on the behalf of [name], MD,as directed by  [name], MD while in the presence of [name], MD.   Established patient visit   Patient: Sharon Nelson   DOB: 1951-02-18   72 y.o. Female  MRN: [id number] Visit Date: 08/12/2022  Today's healthcare provider: [name], MD   No chief complaint on file.  Subjective    HPI  Follow up for Kidney Function:   The patient was last seen for this 1 months ago. Changes made at last visit include monitoring at home .  Lab Results  Component Value Date   NA 139 07/03/2022   K 4.5 07/03/2022   CREATININE 1.31 (H) 07/03/2022   EGFR 43 (L) 07/03/2022   GFRNONAA >60 08/17/2019   GLUCOSE 84 07/03/2022   Recommended valsartan after labs done in December which she declined. She has been trying to drink more water. Is on allopurinol and furosemide.   She reports excellent compliance with treatment. She feels that condition is Unchanged. She is not having side effects.   ---------------------------------------------------------------------------------------------------   Medications: Outpatient Medications Prior to Visit  Medication Sig   ADVAIR HFA 115-21 MCG/ACT inhaler Inhale 2 puffs into the lungs 2 (two) times daily.    albuterol (VENTOLIN HFA) 108 (90 Base) MCG/ACT inhaler Inhale into the lungs every 4 (four) hours as needed.    aspirin 81 MG tablet Take 81 mg by mouth daily.    Cholecalciferol 2000 UNITS CAPS Take by mouth daily.    Continuous Blood Gluc Receiver (FREESTYLE LIBRE 2 READER) DEVI USE 1 DEVICE AS DIRECTED   diphenhydrAMINE (BENADRYL ALLERGY) 25 mg capsule Take 25 mg by mouth every 6 (six) hours as needed.   donepezil (ARICEPT) 10 MG tablet TAKE 1 TABLET(10 MG) BY MOUTH DAILY   EPINEPHrine 0.3 mg/0.3 mL IJ SOAJ injection    estradiol (ESTRACE) 0.5 MG tablet TAKE 1 TABLET(0.5 MG) BY MOUTH EVERY  OTHER DAY   furosemide (LASIX) 20 MG tablet TAKE 1 TABLET(20 MG) BY MOUTH DAILY AS NEEDED FOR SWELLING   gabapentin (NEURONTIN) 800 MG tablet TAKE 1 TABLET(800 MG) BY MOUTH THREE TIMES DAILY   glucose blood (ACCU-CHEK AVIVA PLUS) test strip Use as instructed   levocetirizine (XYZAL) 5 MG tablet Take 5 mg by mouth every evening.    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 REMOVE AND DISCARD AFTER 12 HOURS OR AS DIRECTED BY DOCTOR   rosuvastatin (CRESTOR) 5 MG tablet TAKE 1 TABLET(5 MG) BY MOUTH DAILY. REPLACES LOVASTATIN   Semaglutide, 1 MG/DOSE, 2 MG/1.5ML SOPN Inject 2 mg into the skin once a week. Mondays   spironolactone (ALDACTONE) 100 MG tablet TAKE 1 TABLET(100 MG) BY MOUTH DAILY   traMADol (ULTRAM) 50 MG tablet Take 1 every 4-6 hours as needed for pain not controlled by Tylenol   No facility-administered medications prior to visit.    Review of Systems  Respiratory: Negative.  Negative for cough, shortness of breath and wheezing.   Cardiovascular:  Negative for chest pain, palpitations and leg swelling.  Neurological:  Negative for weakness and headaches.       Objective    BP 134/60 (BP Location: Right Arm, Patient Position: Sitting, Cuff Size: Normal)   Pulse 62   Temp 98.6 F (37 C) (Oral)   Wt 147 lb 8 oz (66.9 kg)   SpO2 100% Comment:  room air  BMI 29.79 kg/m    Physical Exam   General appearance: Well developed, well nourished female, cooperative and in no acute distress Head: Normocephalic, without obvious abnormality, atraumatic Respiratory: Respirations even and unlabored, normal respiratory rate Extremities: All extremities are intact.  Skin: Skin color, texture, turgor normal. No rashes seen  Psych: Appropriate mood and affect. Neurologic: Mental status: Alert, oriented to person, place, and time, thought content appropriate.   Assessment & Plan     1. Chronic kidney disease, stage 3b ([place])  - Renal  function panel She is agreeable to adding ARB if CKD progresses.   2. Essential (primary) hypertension Home Bps usually much better than office readings. She is reluctant to add any other BP medications, but is agreeable to adding ARB if kidney functions decline as above.   - Magnesium  3. Type 2 diabetes mellitus with stage 3b chronic kidney disease, without long-term current use of insulin (HCC) Fairly well controlled on semaglutide with A1c of 7.0 earlier this month per Dr. Honor Junes.        The entirety of the information documented in the History of Present Illness, Review of Systems and Physical Exam were personally obtained by me. Portions of this information were initially documented by the CMA and reviewed by me for thoroughness and accuracy.     [name], MD  [facility] [phone] (phone) [phone] (fax)  [place]

## 2022-08-13 LAB — MAGNESIUM: Magnesium: 2.1 mg/dL (ref 1.6–2.3)

## 2022-08-13 LAB — RENAL FUNCTION PANEL
Albumin: 4.2 g/dL (ref 3.8–4.8)
BUN/Creatinine Ratio: 13 (ref 12–28)
BUN: 15 mg/dL (ref 8–27)
CO2: 23 mmol/L (ref 20–29)
Calcium: 9.7 mg/dL (ref 8.7–10.3)
Chloride: 103 mmol/L (ref 96–106)
Creatinine, Ser: 1.12 mg/dL — ABNORMAL HIGH (ref 0.57–1.00)
Glucose: 101 mg/dL — ABNORMAL HIGH (ref 70–99)
Phosphorus: 3.1 mg/dL (ref 3.0–4.3)
Potassium: 5.3 mmol/L — ABNORMAL HIGH (ref 3.5–5.2)
Sodium: 142 mmol/L (ref 134–144)
eGFR: 52 mL/min/{1.73_m2} — ABNORMAL LOW (ref >59)

## 2022-08-18 ENCOUNTER — Encounter: Payer: Self-pay | Admitting: Gastroenterology

## 2022-08-18 ENCOUNTER — Ambulatory Visit (INDEPENDENT_AMBULATORY_CARE_PROVIDER_SITE_OTHER): Payer: Medicare Other | Admitting: Gastroenterology

## 2022-08-18 VITALS — BP 146/74 | HR 60 | Temp 97.8°F | Ht 59.0 in | Wt 148.0 lb

## 2022-08-18 DIAGNOSIS — K529 Noninfective gastroenteritis and colitis, unspecified: Secondary | ICD-10-CM

## 2022-08-18 DIAGNOSIS — K580 Irritable bowel syndrome with diarrhea: Secondary | ICD-10-CM | POA: Diagnosis not present

## 2022-08-18 MED ORDER — RIFAXIMIN 550 MG PO TABS: 550.0000 mg | ORAL_TABLET | Freq: Three times a day (TID) | ORAL | 0 refills | Status: AC
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
## 2022-08-20 LAB — FOOD ALLERGY PROFILE
Allergen Corn, IgE: <0.1 kU/L
Clam IgE: 0.27 kU/L — AB
Codfish IgE: <0.1 kU/L
Egg White IgE: <0.1 kU/L
Milk IgE: 0.45 kU/L — AB
Peanut IgE: <0.1 kU/L
Scallop IgE: 0.44 kU/L — AB
Sesame Seed IgE: <0.1 kU/L
Shrimp IgE: 0.13 kU/L — AB
Soybean IgE: 0.14 kU/L — AB
Walnut IgE: <0.1 kU/L
Wheat IgE: <0.1 kU/L

## 2022-08-20 LAB — ALPHA-GAL PANEL
Allergen Lamb IgE: 0.12 kU/L — AB
Beef IgE: <0.1 kU/L
IgE (Immunoglobulin E), Serum: 303 IU/mL (ref 6–495)
O215-IgE Alpha-Gal: 0.17 kU/L — AB
Pork IgE: 0.84 kU/L — AB

## 2022-08-21 ENCOUNTER — Telehealth: Payer: Self-pay

## 2022-08-21 NOTE — Telephone Encounter (Signed)
Tried to call patient but mailbox is full  

## 2022-08-21 NOTE — Telephone Encounter (Signed)
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  She has to completely avoid consumption of these meat products.  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  She should definitely eliminate consumption of dairy and dairy products  She can collect a copy of these reports for her reference  Rohini Vanga

## 2022-08-24 NOTE — Telephone Encounter (Signed)
Patient called back and patient verbalized understanding of results. She asked if we could send this to her mychart. Sent results to [employer]

## 2022-08-24 NOTE — Telephone Encounter (Signed)
Called and left a message for call back  

## 2022-09-03 DIAGNOSIS — J3089 Other allergic rhinitis: Secondary | ICD-10-CM | POA: Diagnosis not present

## 2022-09-03 DIAGNOSIS — L2089 Other atopic dermatitis: Secondary | ICD-10-CM | POA: Diagnosis not present

## 2022-09-03 DIAGNOSIS — L501 Idiopathic urticaria: Secondary | ICD-10-CM | POA: Diagnosis not present

## 2022-09-03 DIAGNOSIS — J4530 Mild persistent asthma, uncomplicated: Secondary | ICD-10-CM | POA: Diagnosis not present

## 2022-10-13 ENCOUNTER — Other Ambulatory Visit: Payer: Self-pay | Admitting: Family Medicine

## 2022-11-19 ENCOUNTER — Telehealth: Payer: Self-pay | Admitting: Family Medicine

## 2022-11-19 NOTE — Telephone Encounter (Signed)
Contacted Sharon Nelson to schedule their annual wellness visit. Appointment made for 11/23/2022.  Thank you,  [facility] Support [facility] Direct dial  [phone]

## 2022-11-23 ENCOUNTER — Ambulatory Visit (INDEPENDENT_AMBULATORY_CARE_PROVIDER_SITE_OTHER): Payer: Medicare Other

## 2022-11-23 VITALS — Ht 60.0 in | Wt 148.0 lb

## 2022-11-23 DIAGNOSIS — Z0000 Encounter for general adult medical examination without abnormal findings: Secondary | ICD-10-CM | POA: Diagnosis not present

## 2022-11-23 NOTE — Patient Instructions (Addendum)
Sharon Nelson , Thank you for taking time to come for your Medicare Wellness Visit. I appreciate your ongoing commitment to your health goals. Please review the following plan we discussed and let me know if I can assist you in the future.   These are the goals we discussed:  Goals      Exercise 3x per week (30 min per time)     Recommend starting to exercise for 3 days a week for 30 minutes at a time.      Increase water intake     Starting 07/10/16, I will increase my water intake to 4 glasses a day.     Monitor and Manage My Blood Sugar-Diabetes Type 2     Timeframe:  Long-Range Goal Priority:  High Start Date:  09/27/2020                            Expected End Date:  03/28/2022                     Follow Up Date 06/27/2021     -check blood sugar at least 3 times daily - check blood sugar before and after exercise - check blood sugar if I feel it is too high or too low - take the blood sugar meter to all doctor visits    Why is this important?   Checking your blood sugar at home helps to keep it from getting very high or very low.  Writing the results in a diary or log helps the doctor know how to care for you.  Your blood sugar log should have the time, date and the results.  Also, write down the amount of insulin or other medicine that you take.  Other information, like what you ate, exercise done and how you were feeling, will also be helpful.     Notes:         This is a list of the screening recommended for you and due dates:  Health Maintenance  Topic Date Due   Zoster (Shingles) Vaccine (2 of 2) 01/01/2021   DEXA scan (bone density measurement)  09/28/2021   COVID-19 Vaccine (5 - 2023-24 season) 08/28/2022   Eye exam for diabetics  08/29/2022   Yearly kidney health urinalysis for diabetes  12/31/2022   Hemoglobin A1C  01/26/2023   Flu Shot  02/25/2023   Yearly kidney function blood test for diabetes  08/13/2023   Mammogram  10/16/2023   Medicare Annual Wellness Visit   11/23/2023   Colon Cancer Screening  02/05/2027   DTaP/Tdap/Td vaccine (3 - Td or Tdap) 11/07/2030   Pneumonia Vaccine  Completed   Hepatitis C Screening: USPSTF Recommendation to screen - Ages 16-79 yo.  Completed   HPV Vaccine  Aged Out    Advanced directives: yes  Conditions/risks identified: low falls risk  Next appointment: Follow up in one year for your annual wellness visit 11/24/2023 @ 10:45am telephone   Preventive Care 65 Years and Older, Female Preventive care refers to lifestyle choices and visits with your health care provider that can promote health and wellness. What does preventive care include? A yearly physical exam. This is also called an annual well check. Dental exams once or twice a year. Routine eye exams. Ask your health care provider how often you should have your eyes checked. Personal lifestyle choices, including: Daily care of your teeth and gums. Regular physical activity. Eating a healthy  diet. Avoiding tobacco and drug use. Limiting alcohol use. Practicing safe sex. Taking low-dose aspirin every day. Taking vitamin and mineral supplements as recommended by your health care provider. What happens during an annual well check? The services and screenings done by your health care provider during your annual well check will depend on your age, overall health, lifestyle risk factors, and family history of disease. Counseling  Your health care provider may ask you questions about your: Alcohol use. Tobacco use. Drug use. Emotional well-being. Home and relationship well-being. Sexual activity. Eating habits. History of falls. Memory and ability to understand (cognition). Work and work Astronomer. Reproductive health. Screening  You may have the following tests or measurements: Height, weight, and BMI. Blood pressure. Lipid and cholesterol levels. These may be checked every 5 years, or more frequently if you are over 79 years old. Skin  check. Lung cancer screening. You may have this screening every year starting at age 86 if you have a 30-pack-year history of smoking and currently smoke or have quit within the past 15 years. Fecal occult blood test (FOBT) of the stool. You may have this test every year starting at age 82. Flexible sigmoidoscopy or colonoscopy. You may have a sigmoidoscopy every 5 years or a colonoscopy every 10 years starting at age 56. Hepatitis C blood test. Hepatitis B blood test. Sexually transmitted disease (STD) testing. Diabetes screening. This is done by checking your blood sugar (glucose) after you have not eaten for a while (fasting). You may have this done every 1-3 years. Bone density scan. This is done to screen for osteoporosis. You may have this done starting at age 41. Mammogram. This may be done every 1-2 years. Talk to your health care provider about how often you should have regular mammograms. Talk with your health care provider about your test results, treatment options, and if necessary, the need for more tests. Vaccines  Your health care provider may recommend certain vaccines, such as: Influenza vaccine. This is recommended every year. Tetanus, diphtheria, and acellular pertussis (Tdap, Td) vaccine. You may need a Td booster every 10 years. Zoster vaccine. You may need this after age 83. Pneumococcal 13-valent conjugate (PCV13) vaccine. One dose is recommended after age 65. Pneumococcal polysaccharide (PPSV23) vaccine. One dose is recommended after age 53. Talk to your health care provider about which screenings and vaccines you need and how often you need them. This information is not intended to replace advice given to you by your health care provider. Make sure you discuss any questions you have with your health care provider. Document Released: 08/09/2015 Document Revised: 04/01/2016 Document Reviewed: 05/14/2015 Elsevier Interactive Patient Education  2017 [employer].  Fall  Prevention in the Home Falls can cause injuries. They can happen to people of all ages. There are many things you can do to make your home safe and to help prevent falls. What can I do on the outside of my home? Regularly fix the edges of walkways and driveways and fix any cracks. Remove anything that might make you trip as you walk through a door, such as a raised step or threshold. Trim any bushes or trees on the path to your home. Use bright outdoor lighting. Clear any walking paths of anything that might make someone trip, such as rocks or tools. Regularly check to see if handrails are loose or broken. Make sure that both sides of any steps have handrails. Any raised decks and porches should have guardrails on the edges. Have any  leaves, snow, or ice cleared regularly. Use sand or salt on walking paths during winter. Clean up any spills in your garage right away. This includes oil or grease spills. What can I do in the bathroom? Use night lights. Install grab bars by the toilet and in the tub and shower. Do not use towel bars as grab bars. Use non-skid mats or decals in the tub or shower. If you need to sit down in the shower, use a plastic, non-slip stool. Keep the floor dry. Clean up any water that spills on the floor as soon as it happens. Remove soap buildup in the tub or shower regularly. Attach bath mats securely with double-sided non-slip rug tape. Do not have throw rugs and other things on the floor that can make you trip. What can I do in the bedroom? Use night lights. Make sure that you have a light by your bed that is easy to reach. Do not use any sheets or blankets that are too big for your bed. They should not hang down onto the floor. Have a firm chair that has side arms. You can use this for support while you get dressed. Do not have throw rugs and other things on the floor that can make you trip. What can I do in the kitchen? Clean up any spills right away. Avoid  walking on wet floors. Keep items that you use a lot in easy-to-reach places. If you need to reach something above you, use a strong step stool that has a grab bar. Keep electrical cords out of the way. Do not use floor polish or wax that makes floors slippery. If you must use wax, use non-skid floor wax. Do not have throw rugs and other things on the floor that can make you trip. What can I do with my stairs? Do not leave any items on the stairs. Make sure that there are handrails on both sides of the stairs and use them. Fix handrails that are broken or loose. Make sure that handrails are as long as the stairways. Check any carpeting to make sure that it is firmly attached to the stairs. Fix any carpet that is loose or worn. Avoid having throw rugs at the top or bottom of the stairs. If you do have throw rugs, attach them to the floor with carpet tape. Make sure that you have a light switch at the top of the stairs and the bottom of the stairs. If you do not have them, ask someone to add them for you. What else can I do to help prevent falls? Wear shoes that: Do not have high heels. Have rubber bottoms. Are comfortable and fit you well. Are closed at the toe. Do not wear sandals. If you use a stepladder: Make sure that it is fully opened. Do not climb a closed stepladder. Make sure that both sides of the stepladder are locked into place. Ask someone to hold it for you, if possible. Clearly mark and make sure that you can see: Any grab bars or handrails. First and last steps. Where the edge of each step is. Use tools that help you move around (mobility aids) if they are needed. These include: Canes. Walkers. Scooters. Crutches. Turn on the lights when you go into a dark area. Replace any light bulbs as soon as they burn out. Set up your furniture so you have a clear path. Avoid moving your furniture around. If any of your floors are uneven, fix them. If there  are any pets around  you, be aware of where they are. Review your medicines with your doctor. Some medicines can make you feel dizzy. This can increase your chance of falling. Ask your doctor what other things that you can do to help prevent falls. This information is not intended to replace advice given to you by your health care provider. Make sure you discuss any questions you have with your health care provider. Document Released: 05/09/2009 Document Revised: 12/19/2015 Document Reviewed: 08/17/2014 Elsevier Interactive Patient Education  2017 [employer].
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
## 2022-12-08 ENCOUNTER — Other Ambulatory Visit: Payer: Self-pay | Admitting: Family Medicine

## 2022-12-08 DIAGNOSIS — N951 Menopausal and female climacteric states: Secondary | ICD-10-CM

## 2022-12-08 NOTE — Telephone Encounter (Signed)
Requested Prescriptions  Pending Prescriptions Disp Refills   estradiol (ESTRACE) 0.5 MG tablet [Pharmacy Med Name: ESTRADIOL 0.5MG  TABLETS] 45 tablet 1    Sig: TAKE 1 TABLET(0.5 MG) BY MOUTH EVERY OTHER DAY     OB/GYN:  Estrogens Failed - 12/08/2022  4:53 PM      Failed - Last BP in normal range    BP Readings from Last 1 Encounters:  08/18/22 (!) 146/74         Passed - Mammogram is up-to-date per Health Maintenance      Passed - Valid encounter within last 12 months    Recent Outpatient Visits           3 months ago Chronic kidney disease, stage 3b (HCC)   [place] [facility] Fisher, Demetrios Isaacs, MD   5 months ago Essential (primary) hypertension   [place] [facility] Malva Limes, MD   11 months ago Essential (primary) hypertension   [facility] [facility] Malva Limes, MD   1 year ago Mass of lower outer quadrant of right breast   [place] [facility] Malva Limes, MD   1 year ago Bacterial sinusitis   [facility] Jacky Kindle, [place]

## 2022-12-29 ENCOUNTER — Other Ambulatory Visit: Payer: Self-pay | Admitting: Family Medicine

## 2022-12-30 NOTE — Telephone Encounter (Signed)
Labs in date  Requested Prescriptions  Pending Prescriptions Disp Refills   furosemide (LASIX) 20 MG tablet [Pharmacy Med Name: FUROSEMIDE 20MG  TABLETS] 90 tablet 1    Sig: TAKE 1 TABLET(20 MG) BY MOUTH DAILY AS NEEDED FOR SWELLING     Cardiovascular:  Diuretics - Loop Failed - 12/29/2022  6:23 PM      Failed - K in normal range and within 180 days    Potassium  Date Value Ref Range Status  08/12/2022 5.3 (H) 3.5 - 5.2 mmol/L Final  02/12/2014 4.4 3.5 - 5.1 mmol/L Final         Failed - Cr in normal range and within 180 days    Creatinine  Date Value Ref Range Status  02/12/2014 1.04 0.60 - 1.30 mg/dL Final   Creatinine, Ser  Date Value Ref Range Status  08/12/2022 1.12 (H) 0.57 - 1.00 mg/dL Final   Creatinine, POC  Date Value Ref Range Status  05/15/2019 n/a mg/dL Final         Failed - Last BP in normal range    BP Readings from Last 1 Encounters:  08/18/22 (!) 146/74         Passed - Ca in normal range and within 180 days    Calcium  Date Value Ref Range Status  08/12/2022 9.7 8.7 - 10.3 mg/dL Final   Calcium, Total  Date Value Ref Range Status  02/12/2014 8.0 (L) 8.5 - 10.1 mg/dL Final         Passed - Na in normal range and within 180 days    Sodium  Date Value Ref Range Status  08/12/2022 142 134 - 144 mmol/L Final  02/12/2014 142 136 - 145 mmol/L Final         Passed - Cl in normal range and within 180 days    Chloride  Date Value Ref Range Status  08/12/2022 103 96 - 106 mmol/L Final  02/12/2014 112 (H) 98 - 107 mmol/L Final         Passed - Mg Level in normal range and within 180 days    Magnesium  Date Value Ref Range Status  08/12/2022 2.1 1.6 - 2.3 mg/dL Final  16/04/9603 2.2 mg/dL Final    Comment:    5.4-0.9 THERAPEUTIC RANGE: 4-7 mg/dL TOXIC: > 10 mg/dL  -----------------------          Passed - Valid encounter within last 6 months    Recent Outpatient Visits           4 months ago Chronic kidney disease, stage 3b (HCC)   [facility] Malva Limes, MD   6 months ago Essential (primary) hypertension   [place] [facility] Malva Limes, MD   12 months ago Essential (primary) hypertension   [place] [facility] Malva Limes, MD   1 year ago Mass of lower outer quadrant of right breast   [place] [facility] Malva Limes, MD   1 year ago Bacterial sinusitis   [facility] Jacky Kindle, FNP

## 2022-12-31 ENCOUNTER — Other Ambulatory Visit: Payer: Self-pay | Admitting: Family Medicine

## 2022-12-31 NOTE — Telephone Encounter (Signed)
Requested medication (s) are due for refill today: routing for review  Requested medication (s) are on the active medication list: yes  Last refill:  03/15/20  Future visit scheduled: no  Notes to clinic:  Unable to refill per protocol, last refill 03/15/20 for 30 and 5 refills, should patient continue to take      Requested Prescriptions  Pending Prescriptions Disp Refills   lidocaine (LIDODERM) 5 % 30 patch 5    Sig: Remove & Discard patch within 12 hours or as directed by MD     Analgesics:  Topicals Failed - 12/31/2022  9:52 AM      Failed - Manual Review: Labs are only required if the patient has taken medication for more than 8 weeks.      Failed - Cr in normal range and within 360 days    Creatinine  Date Value Ref Range Status  02/12/2014 1.04 0.60 - 1.30 mg/dL Final   Creatinine, Ser  Date Value Ref Range Status  08/12/2022 1.12 (H) 0.57 - 1.00 mg/dL Final   Creatinine, POC  Date Value Ref Range Status  05/15/2019 n/a mg/dL Final         Passed - PLT in normal range and within 360 days    Platelets  Date Value Ref Range Status  07/03/2022 292 150 - 450 x10E3/uL Final         Passed - HGB in normal range and within 360 days    Hemoglobin  Date Value Ref Range Status  07/03/2022 12.0 11.1 - 15.9 g/dL Final         Passed - HCT in normal range and within 360 days    Hematocrit  Date Value Ref Range Status  07/03/2022 36.6 34.0 - 46.6 % Final         Passed - eGFR is 30 or above and within 360 days    EGFR (African American)  Date Value Ref Range Status  02/12/2014 >60  Final   GFR calc Af Amer  Date Value Ref Range Status  08/17/2019 >60 >60 mL/min Final   EGFR (Non-African Amer.)  Date Value Ref Range Status  02/12/2014 56 (L)  Final    Comment:    eGFR values <57mL/min/1.73 m2 may be an indication of chronic kidney disease (CKD). Calculated eGFR is useful in patients with stable renal function. The eGFR calculation will not be reliable in  acutely ill patients when serum creatinine is changing rapidly. It is not useful in  patients on dialysis. The eGFR calculation may not be applicable to patients at the low and high extremes of body sizes, pregnant women, and vegetarians. LABS - This specimen was collected through an   - indwelling catheter or arterial line.  - A minimum of of blood was wasted prior    - to collecting the sample.  Interpret  - results with caution.    GFR calc non Af Amer  Date Value Ref Range Status  08/17/2019 >60 >60 mL/min Final   eGFR  Date Value Ref Range Status  08/12/2022 52 (L) >59 mL/min/1.73 Final         Passed - Patient is not pregnant      Passed - Valid encounter within last 12 months    Recent Outpatient Visits           4 months ago Chronic kidney disease, stage 3b (HCC)   [place] Malva Limes, MD   6 months ago  Essential (primary) hypertension   [facility] Malva Limes, MD   1 year ago Essential (primary) hypertension   [place] Malva Limes, MD   1 year ago Mass of lower outer quadrant of right breast   [facility] Malva Limes, MD   1 year ago Bacterial sinusitis   [facility] Jacky Kindle, [place]

## 2022-12-31 NOTE — Telephone Encounter (Signed)
Medication Refill - Medication: lidocaine (LIDODERM) 5 %   Has the patient contacted their pharmacy? Yes.   Pharmacy advised pt to reach out to PCP.    Preferred Pharmacy (with phone number or street name):  [id number] - [place], [place] - [address] AT [facility] OF [address] [facility] Phone: [phone]  Fax: [phone]     Has the patient been seen for an appointment in the last year OR does the patient have an upcoming appointment? Yes.    Agent: Please be advised that RX refills may take up to 3 business days. We ask that you follow-up with your pharmacy.

## 2023-01-01 MED ORDER — LIDOCAINE 5 % EX PTCH: MEDICATED_PATCH | CUTANEOUS | 5 refills | Status: AC

## 2023-01-06 ENCOUNTER — Ambulatory Visit (INDEPENDENT_AMBULATORY_CARE_PROVIDER_SITE_OTHER): Payer: Medicare Other | Admitting: Family Medicine

## 2023-01-06 ENCOUNTER — Encounter: Payer: Self-pay | Admitting: Family Medicine

## 2023-01-06 ENCOUNTER — Ambulatory Visit
Admission: RE | Admit: 2023-01-06 | Discharge: 2023-01-06 | Disposition: A | Discharge status: Home / Self Care | Payer: Medicare Other | Source: Ambulatory Visit | Attending: Family Medicine | Admitting: Family Medicine

## 2023-01-06 VITALS — BP 112/67 | HR 61 | Temp 98.0°F | Resp 12 | Wt 150.5 lb

## 2023-01-06 DIAGNOSIS — M545 Low back pain, unspecified: Secondary | ICD-10-CM | POA: Diagnosis not present

## 2023-01-06 DIAGNOSIS — G6289 Other specified polyneuropathies: Secondary | ICD-10-CM | POA: Diagnosis not present

## 2023-01-06 DIAGNOSIS — M62830 Muscle spasm of back: Secondary | ICD-10-CM

## 2023-01-06 DIAGNOSIS — S8991XS Unspecified injury of right lower leg, sequela: Secondary | ICD-10-CM | POA: Diagnosis not present

## 2023-01-06 DIAGNOSIS — M50323 Other cervical disc degeneration at C6-C7 level: Secondary | ICD-10-CM | POA: Diagnosis not present

## 2023-01-06 DIAGNOSIS — M5136 Other intervertebral disc degeneration, lumbar region: Secondary | ICD-10-CM | POA: Diagnosis not present

## 2023-01-06 DIAGNOSIS — M47812 Spondylosis without myelopathy or radiculopathy, cervical region: Secondary | ICD-10-CM | POA: Diagnosis not present

## 2023-01-06 MED ORDER — TIZANIDINE HCL 4 MG PO TABS
4.0000 mg | ORAL_TABLET | Freq: Four times a day (QID) | ORAL | 0 refills | Status: DC | PRN
Start: 2023-01-06 — End: 2023-01-12

## 2023-01-06 MED ORDER — NAPROXEN 250 MG PO TABS: 250.0000 mg | ORAL_TABLET | Freq: Two times a day (BID) | ORAL | 0 refills | Status: DC

## 2023-01-06 MED ORDER — METHYLPREDNISOLONE ACETATE 40 MG/ML IJ SUSP
40.0000 mg | Freq: Once | INTRAMUSCULAR | Status: AC
Start: 2023-01-06 — End: 2023-01-06
  Administered 2023-01-06: 40 mg via INTRAMUSCULAR
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
## 2023-01-11 ENCOUNTER — Ambulatory Visit: Payer: Self-pay

## 2023-01-11 NOTE — Telephone Encounter (Addendum)
  Chief Complaint: medication reaction Symptoms: rash and whelping reaction from tizanidine Frequency: Saturday  Pertinent Negatives:NA Disposition: [] ED /[] Urgent Care (no appt availability in office) / [x] Appointment(In office/virtual)/ []  [place] [facility]/ [] Home Care/ [] Refused Recommended Disposition /[] [place] [facility]/ []  Follow-up with PCP Additional Notes: pt states she started the tizanidine on Thursday and took it Friday and noticed rash and itching and whelping on Saturday so she stopped medication, still has slight rash and sensation. Advised pt to not continue medication and scheduled FU OV tomorrow at 1520 with Maralyn Sago, DO.   Reason for Disposition  [1] Caller has NON-URGENT medicine question about med that PCP prescribed AND [2] triager unable to answer question  Answer Assessment - Initial Assessment Questions 1. NAME of MEDICINE: "What medicine(s) are you calling about?"     Tizanidine  2. QUESTION: "What is your question?" (e.g., double dose of medicine, side effect)     Had rash from medication 3. PRESCRIBER: "Who prescribed the medicine?" Reason: if prescribed by specialist, call should be referred to that group.     Jacquenette Shone  4. SYMPTOMS: "Do you have any symptoms?" If Yes, ask: "What symptoms are you having?"  "How bad are the symptoms (e.g., mild, moderate, severe)     Rash and welping  Protocols used: Medication Question Call-A-AH

## 2023-01-12 ENCOUNTER — Ambulatory Visit: Payer: BC Managed Care – PPO | Admitting: Family Medicine

## 2023-01-12 ENCOUNTER — Encounter: Payer: Self-pay | Admitting: Family Medicine

## 2023-01-12 ENCOUNTER — Ambulatory Visit (INDEPENDENT_AMBULATORY_CARE_PROVIDER_SITE_OTHER): Payer: Medicare Other | Admitting: Family Medicine

## 2023-01-12 VITALS — BP 135/65 | HR 57 | Temp 98.1°F | Ht 59.0 in | Wt 148.0 lb

## 2023-01-12 DIAGNOSIS — M545 Low back pain, unspecified: Secondary | ICD-10-CM

## 2023-01-12 DIAGNOSIS — M62830 Muscle spasm of back: Secondary | ICD-10-CM | POA: Diagnosis not present

## 2023-01-12 MED ORDER — PREDNISONE 20 MG PO TABS: 40.0000 mg | ORAL_TABLET | Freq: Every day | ORAL | 0 refills | Status: DC

## 2023-01-12 NOTE — Progress Notes (Signed)
Established patient visit   Patient: Sharon Nelson   DOB: October 14, 1950   72 y.o. Female  MRN: [id number] Visit Date: 01/12/2023  Today's healthcare provider: Sherlyn Hay, DO   No chief complaint on file.  Subjective    HPI HPI   Pt states she had a reaction to Aldactone. Medication made her itch and made her skin welt. Last edited by Daneen Schick, CMA on 01/12/2023  9:33 AM.      Patient is a 72 year old female who presents for evaluation of what might be a medication reaction.   Went to a wedding out of state Saturday and her back did pretty well overall the day.  She does have several food allergies, as well as alpha gal, and she tries meticulously to avoid eating any of her allergens.  Felt better for two days after methylprednisolone shot. Was here Wednesday, took one that night, then took twice daily through Saturday night. She started having mild itching on Thursday. Had splotches around her neck.   She endorses that welts occur when she scratches, though these are not visible today.  She has been trying to avoid scratching. She did stop taking the naproxen at the same time out of concern that that may have been the source, since she was taking both together.  However, she reports that she has had naproxen and very steroids in the past without problem.  Has not been doing stretching/strengthening exercises due to wedding.    Medications: Outpatient Medications Prior to Visit  Medication Sig Note   ADVAIR HFA 115-21 MCG/ACT inhaler Inhale 2 puffs into the lungs 2 (two) times daily.     albuterol (VENTOLIN HFA) 108 (90 Base) MCG/ACT inhaler Inhale into the lungs every 4 (four) hours as needed.     aspirin 81 MG tablet Take 81 mg by mouth daily.     Cholecalciferol 2000 UNITS CAPS Take by mouth daily.     Continuous Blood Gluc Receiver (FREESTYLE LIBRE 2 READER) DEVI USE 1 DEVICE AS DIRECTED    donepezil (ARICEPT) 10 MG tablet TAKE 1 TABLET(10 MG) BY MOUTH DAILY     EPINEPHrine 0.3 mg/0.3 mL IJ SOAJ injection     estradiol (ESTRACE) 0.5 MG tablet TAKE 1 TABLET(0.5 MG) BY MOUTH EVERY OTHER DAY    furosemide (LASIX) 20 MG tablet TAKE 1 TABLET(20 MG) BY MOUTH DAILY AS NEEDED FOR SWELLING    gabapentin (NEURONTIN) 800 MG tablet TAKE 1 TABLET(800 MG) BY MOUTH THREE TIMES DAILY    glucose blood (ACCU-CHEK AVIVA PLUS) test strip Use as instructed    levocetirizine (XYZAL) 5 MG tablet Take 5 mg by mouth every evening.     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 REMOVE AND DISCARD AFTER 12 HOURS OR AS DIRECTED BY DOCTOR    rosuvastatin (CRESTOR) 5 MG tablet TAKE 1 TABLET(5 MG) BY MOUTH DAILY. REPLACES LOVASTATIN    Semaglutide, 1 MG/DOSE, 2 MG/1.5ML SOPN Inject 2 mg into the skin once a week. Mondays    naproxen (NAPROSYN) 250 MG tablet Take 1 tablet (250 mg total) by mouth 2 (two) times daily with a meal. (Patient not taking: Reported on 01/12/2023)    spironolactone (ALDACTONE) 100 MG tablet TAKE 1 TABLET(100 MG) BY MOUTH DAILY (Patient not taking: Reported on 01/12/2023) 01/12/2023: Pt states medication gave her itching and welts   [DISCONTINUED] tiZANidine (ZANAFLEX) 4 MG tablet Take 1 tablet (4 mg total)  by mouth every 6 (six) hours as needed for muscle spasms.    No facility-administered medications prior to visit.    Review of Systems  Constitutional:  Negative for activity change.  HENT:  Negative for trouble swallowing.   Eyes:  Negative for visual disturbance.  Respiratory:  Negative for chest tightness, shortness of breath and wheezing.   Cardiovascular:  Negative for chest pain.  Gastrointestinal:  Negative for nausea and vomiting.  Musculoskeletal:  Positive for back pain.  Skin:  Positive for rash.  Allergic/Immunologic: Positive for food allergies.       Objective    BP 135/65 (BP Location: Right Arm, Patient Position: Sitting, Cuff Size: Normal)   Pulse (!) 57   Temp 98.1 F (36.7 C) (Oral)    Ht 4\' 11"  (1.499 m)   Wt 148 lb (67.1 kg)   SpO2 98%   BMI 29.89 kg/m    Physical Exam Vitals reviewed.  Constitutional:      General: She is not in acute distress.    Appearance: Normal appearance. She is well-developed.  HENT:     Head: Normocephalic and atraumatic.  Eyes:     General: No scleral icterus.    Conjunctiva/sclera: Conjunctivae normal.  Cardiovascular:     Rate and Rhythm: Normal rate and regular rhythm.  Pulmonary:     Effort: Pulmonary effort is normal. No respiratory distress.  Musculoskeletal:       Back:     Comments: Pain is predominantly in the lower right portion of her back and only extends upward infrequently with twisting movements.   Skin:    General: Skin is warm and dry.     Findings: No rash.  Neurological:     Mental Status: She is alert and oriented to person, place, and time.  Psychiatric:        Mood and Affect: Mood normal.        Behavior: Behavior normal.      No results found for any visits on 01/12/23.  Assessment & Plan    1. Spasm of muscle of lower back Patient did significantly better with the steroid injection she was given at the last visit.  She then started her naproxen and tizanidine as prescribed, then began having itching the following day.  She has used naproxen and taken steroid medications in the past without problem, so it does seem very likely that the tizanidine is the likely source of her itching.  Stopped the tizanidine.  Will start patient on prednisone as noted below to address both her pain and the itching.  Advised her to finish out her naproxen after she completes the steroids.  Strongly encouraged her to do stretching and strengthening exercises and gave her printouts with options for these.  Discussed that, when she does resume her exercises at the gym, she should do so without weights initially and work back up to using weights. - predniSONE (DELTASONE) 20 MG tablet; Take 2 tablets (40 mg total) by mouth daily  with breakfast.  Dispense: 8 tablet; Refill: 0  2. Lumbar back pain - predniSONE (DELTASONE) 20 MG tablet; Take 2 tablets (40 mg total) by mouth daily with breakfast.  Dispense: 8 tablet; Refill: 0    Return in about 2 weeks (around 01/26/2023).      The entirety of the information documented in the History of Present Illness, Review of Systems and Physical Exam were personally obtained by me. Portions of this information were initially documented by the CMA, Raven  Vela Prose, and reviewed by me for thoroughness and accuracy.   I discussed the assessment and treatment plan with the patient  The patient was provided an opportunity to ask questions and all were answered. The patient agreed with the plan and demonstrated an understanding of the instructions.   The patient was advised to call back or seek an in-person evaluation if the symptoms worsen or if the condition fails to improve as anticipated.   Total time was 30 minutes. That includes chart review before the visit, the actual patient visit, and time spent on documentation after the visit.    Sherlyn Hay, DO  [facility] [phone] (phone) [phone] (fax)  [facility]

## 2023-01-18 ENCOUNTER — Ambulatory Visit: Payer: Medicare Other | Admitting: Family Medicine

## 2023-01-26 DIAGNOSIS — E785 Hyperlipidemia, unspecified: Secondary | ICD-10-CM | POA: Diagnosis not present

## 2023-01-26 DIAGNOSIS — E1159 Type 2 diabetes mellitus with other circulatory complications: Secondary | ICD-10-CM | POA: Diagnosis not present

## 2023-01-26 DIAGNOSIS — E1169 Type 2 diabetes mellitus with other specified complication: Secondary | ICD-10-CM | POA: Diagnosis not present

## 2023-01-26 DIAGNOSIS — I152 Hypertension secondary to endocrine disorders: Secondary | ICD-10-CM | POA: Diagnosis not present

## 2023-01-26 DIAGNOSIS — E1165 Type 2 diabetes mellitus with hyperglycemia: Secondary | ICD-10-CM | POA: Diagnosis not present

## 2023-02-16 ENCOUNTER — Ambulatory Visit (INDEPENDENT_AMBULATORY_CARE_PROVIDER_SITE_OTHER): Payer: Medicare Other | Admitting: Family Medicine

## 2023-02-16 ENCOUNTER — Encounter: Payer: Self-pay | Admitting: Family Medicine

## 2023-02-16 VITALS — BP 137/73 | HR 58 | Temp 97.8°F | Resp 12 | Ht 59.0 in | Wt 150.0 lb

## 2023-02-16 DIAGNOSIS — E2839 Other primary ovarian failure: Secondary | ICD-10-CM | POA: Diagnosis not present

## 2023-02-16 DIAGNOSIS — N1832 Chronic kidney disease, stage 3b: Secondary | ICD-10-CM

## 2023-02-16 DIAGNOSIS — E559 Vitamin D deficiency, unspecified: Secondary | ICD-10-CM | POA: Diagnosis not present

## 2023-02-16 DIAGNOSIS — I10 Essential (primary) hypertension: Secondary | ICD-10-CM

## 2023-02-16 DIAGNOSIS — H6123 Impacted cerumen, bilateral: Secondary | ICD-10-CM

## 2023-02-16 DIAGNOSIS — K760 Fatty (change of) liver, not elsewhere classified: Secondary | ICD-10-CM

## 2023-02-16 DIAGNOSIS — E7800 Pure hypercholesterolemia, unspecified: Secondary | ICD-10-CM

## 2023-02-16 DIAGNOSIS — E1129 Type 2 diabetes mellitus with other diabetic kidney complication: Secondary | ICD-10-CM

## 2023-02-16 DIAGNOSIS — N183 Chronic kidney disease, stage 3 unspecified: Secondary | ICD-10-CM | POA: Diagnosis not present

## 2023-02-16 DIAGNOSIS — Z0000 Encounter for general adult medical examination without abnormal findings: Secondary | ICD-10-CM

## 2023-02-16 DIAGNOSIS — Z91018 Allergy to other foods: Secondary | ICD-10-CM

## 2023-02-16 DIAGNOSIS — Z91014 Allergy to mammalian meats: Secondary | ICD-10-CM | POA: Diagnosis not present

## 2023-02-16 DIAGNOSIS — G8929 Other chronic pain: Secondary | ICD-10-CM

## 2023-02-16 DIAGNOSIS — Z96651 Presence of right artificial knee joint: Secondary | ICD-10-CM

## 2023-02-16 DIAGNOSIS — Z289 Immunization not carried out for unspecified reason: Secondary | ICD-10-CM

## 2023-02-16 MED ORDER — SHINGRIX 50 MCG/0.5ML IM SUSR: 0.5000 mL | Freq: Once | INTRAMUSCULAR | 0 refills | Status: AC

## 2023-02-16 NOTE — Patient Instructions (Signed)
Please review the attached list of medications and notify my office if there are any errors.   Use over the counter Debrox drops to clear the ear wax from your ear canals
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
## 2023-02-19 NOTE — Addendum Note (Signed)
Addended by: Malva Limes on: 02/19/2023 01:15 PM   Modules accepted: Level of Service

## 2023-02-23 DIAGNOSIS — E1122 Type 2 diabetes mellitus with diabetic chronic kidney disease: Secondary | ICD-10-CM | POA: Diagnosis not present

## 2023-02-23 DIAGNOSIS — E7800 Pure hypercholesterolemia, unspecified: Secondary | ICD-10-CM | POA: Diagnosis not present

## 2023-02-23 DIAGNOSIS — N1832 Chronic kidney disease, stage 3b: Secondary | ICD-10-CM | POA: Diagnosis not present

## 2023-02-23 DIAGNOSIS — E559 Vitamin D deficiency, unspecified: Secondary | ICD-10-CM | POA: Diagnosis not present

## 2023-02-26 ENCOUNTER — Other Ambulatory Visit: Payer: Self-pay | Admitting: Family Medicine

## 2023-02-26 DIAGNOSIS — N63 Unspecified lump in unspecified breast: Secondary | ICD-10-CM

## 2023-02-26 DIAGNOSIS — Z1231 Encounter for screening mammogram for malignant neoplasm of breast: Secondary | ICD-10-CM

## 2023-03-01 ENCOUNTER — Encounter: Payer: Self-pay | Admitting: Family Medicine

## 2023-03-10 ENCOUNTER — Other Ambulatory Visit: Payer: Self-pay | Admitting: Family Medicine

## 2023-03-10 DIAGNOSIS — M542 Cervicalgia: Secondary | ICD-10-CM

## 2023-03-10 DIAGNOSIS — G43019 Migraine without aura, intractable, without status migrainosus: Secondary | ICD-10-CM

## 2023-03-10 DIAGNOSIS — G500 Trigeminal neuralgia: Secondary | ICD-10-CM

## 2023-03-11 NOTE — Telephone Encounter (Signed)
Requested Prescriptions  Pending Prescriptions Disp Refills   gabapentin (NEURONTIN) 800 MG tablet [Pharmacy Med Name: GABAPENTIN 800MG  TABLETS] 90 tablet 3    Sig: TAKE 1 TABLET(800 MG) BY MOUTH THREE TIMES DAILY     Neurology: Anticonvulsants - gabapentin Failed - 03/10/2023 10:36 AM      Failed - Cr in normal range and within 360 days    Creatinine  Date Value Ref Range Status  02/12/2014 1.04 0.60 - 1.30 mg/dL Final   Creatinine, Ser  Date Value Ref Range Status  02/23/2023 1.18 (H) 0.57 - 1.00 mg/dL Final   Creatinine, POC  Date Value Ref Range Status  05/15/2019 n/a mg/dL Final         Passed - Completed PHQ-2 or PHQ-9 in the last 360 days      Passed - Valid encounter within last 12 months    Recent Outpatient Visits           3 weeks ago Chronic kidney disease, stage 3b (HCC)   [place] [facility] Malva Limes, MD   1 month ago Spasm of muscle of lower back   [facility], Monico Blitz, DO   2 months ago Spasm of muscle of lower back   [facility] [place], Monico Blitz, DO   7 months ago Chronic kidney disease, stage 3b [facility])   [place] [facility] Malva Limes, MD   8 months ago Essential (primary) hypertension   [place] [facility] Malva Limes, MD       Future Appointments             In 5 months Fisher, Demetrios Isaacs, MD [facility]

## 2023-03-26 ENCOUNTER — Other Ambulatory Visit: Payer: Self-pay | Admitting: Family Medicine

## 2023-03-30 ENCOUNTER — Other Ambulatory Visit: Payer: Medicare Other

## 2023-04-12 DIAGNOSIS — M545 Low back pain, unspecified: Secondary | ICD-10-CM | POA: Diagnosis not present

## 2023-04-12 DIAGNOSIS — G8929 Other chronic pain: Secondary | ICD-10-CM | POA: Diagnosis not present

## 2023-04-14 ENCOUNTER — Ambulatory Visit
Admission: RE | Admit: 2023-04-14 | Discharge: 2023-04-14 | Disposition: A | Discharge status: Home / Self Care | Payer: Medicare Other | Source: Ambulatory Visit | Attending: Family Medicine | Admitting: Family Medicine

## 2023-04-14 DIAGNOSIS — N6310 Unspecified lump in the right breast, unspecified quadrant: Secondary | ICD-10-CM | POA: Diagnosis present

## 2023-04-14 DIAGNOSIS — R92333 Mammographic heterogeneous density, bilateral breasts: Secondary | ICD-10-CM | POA: Insufficient documentation

## 2023-04-14 DIAGNOSIS — Z48817 Encounter for surgical aftercare following surgery on the skin and subcutaneous tissue: Secondary | ICD-10-CM | POA: Diagnosis not present

## 2023-04-14 DIAGNOSIS — N63 Unspecified lump in unspecified breast: Secondary | ICD-10-CM | POA: Insufficient documentation

## 2023-04-14 DIAGNOSIS — N6320 Unspecified lump in the left breast, unspecified quadrant: Secondary | ICD-10-CM | POA: Diagnosis present

## 2023-04-16 ENCOUNTER — Telehealth: Payer: Self-pay | Admitting: Family Medicine

## 2023-04-16 NOTE — Telephone Encounter (Signed)
Pt is calling to request a handicap decal renewal. Please advise CB- [phone]

## 2023-05-17 ENCOUNTER — Ambulatory Visit
Admission: RE | Admit: 2023-05-17 | Discharge: 2023-05-17 | Disposition: A | Discharge status: Home / Self Care | Payer: Medicare Other | Source: Ambulatory Visit | Attending: Family Medicine | Admitting: Family Medicine

## 2023-05-17 DIAGNOSIS — M85852 Other specified disorders of bone density and structure, left thigh: Secondary | ICD-10-CM | POA: Diagnosis not present

## 2023-05-17 DIAGNOSIS — E2839 Other primary ovarian failure: Secondary | ICD-10-CM | POA: Diagnosis not present

## 2023-05-20 ENCOUNTER — Encounter: Payer: Self-pay | Admitting: Physician Assistant

## 2023-05-20 ENCOUNTER — Ambulatory Visit: Payer: Self-pay

## 2023-05-20 ENCOUNTER — Ambulatory Visit (INDEPENDENT_AMBULATORY_CARE_PROVIDER_SITE_OTHER): Payer: Medicare Other | Admitting: Physician Assistant

## 2023-05-20 ENCOUNTER — Ambulatory Visit: Payer: BC Managed Care – PPO | Admitting: Physician Assistant

## 2023-05-20 VITALS — BP 138/70 | HR 84 | Temp 98.4°F | Resp 16 | Ht 59.0 in | Wt 150.7 lb

## 2023-05-20 DIAGNOSIS — N390 Urinary tract infection, site not specified: Secondary | ICD-10-CM

## 2023-05-20 DIAGNOSIS — R319 Hematuria, unspecified: Secondary | ICD-10-CM

## 2023-05-20 LAB — POCT URINALYSIS DIPSTICK
Bilirubin, UA: NEGATIVE
Glucose, UA: NEGATIVE
Protein, UA: POSITIVE — AB
Spec Grav, UA: 1.02 (ref 1.010–1.025)
Urobilinogen, UA: 0.2 U/dL
pH, UA: 6.5 (ref 5.0–8.0)

## 2023-05-20 MED ORDER — PHENAZOPYRIDINE HCL 100 MG PO TABS: 100.0000 mg | ORAL_TABLET | Freq: Three times a day (TID) | ORAL | 0 refills | Status: DC | PRN

## 2023-05-20 MED ORDER — NITROFURANTOIN MONOHYD MACRO 100 MG PO CAPS: 100.0000 mg | ORAL_CAPSULE | Freq: Two times a day (BID) | ORAL | 0 refills | Status: AC

## 2023-05-20 NOTE — Patient Instructions (Signed)
Based on your symptoms and results of the urinalysis I believe you have a UTI I recommend the following:   I have sent in a script for Macrobid 100 mg to be taken by mouth twice per day for 7 days   Please finish the entire course of the antibiotic even if you are feeling better before it is completed unless you have an allergic reaction. If this occurs, please call our office promptly for further instructions or go to the ED if having a severe reaction   Stay well hydrated (at least 75 oz of water per day) and avoid holding your urine  If you have any of the following please let us know: persistent symptoms, fever, trouble urinating or inability to urinate, confusion, flank pain.

## 2023-05-20 NOTE — Telephone Encounter (Signed)
Chief Complaint: Blood in urine Symptoms: blood in urine, ride side flank pain, lower abdominal pain, urine odor, pain with urination, increased frequency, nausea Frequency: constant Pertinent Negatives: Patient denies fever, burning, Disposition: [] ED /[] Urgent Care (no appt availability in office) / [x] Appointment(In office/virtual)/ []  [facility]/ [] Home Care/ [] Refused Recommended Disposition /[] [facility]/ []  Follow-up with PCP Additional Notes: Patient reports 1 episode of blood in the urine today. Patient also reports ride side flank pain, lower abdominal pain, urine odor, pain with urination, increased frequency, and nausea. Patient states she has increased frequency but not much urine is coming out when she tries to go to the bathroom. Patient reports pain 5/10 with urination. Care advice given and patient has been scheduled for evaluation at [facility] due to the office not having availability today.  Reason for Disposition  [1] Pain or burning with passing urine AND [2] side (flank) or back pain present  Answer Assessment - Initial Assessment Questions 1. COLOR of URINE: "Describe the color of the urine."  (e.g., tea-colored, pink, red, bloody) "Do you have blood clots in your urine?" (e.g., none, pea, grape, small coin)     Red 2. ONSET: "When did the bleeding start?"      Yesterday 3. EPISODES: "How many times has there been blood in the urine?" or "How many times today?"     1 time today 4. PAIN with URINATION: "Is there any pain with passing your urine?" If Yes, ask: "How bad is the pain?"  (Scale 1-10; or mild, moderate, severe)    - MILD: Complains slightly about urination hurting.    - MODERATE: Interferes with normal activities.      - SEVERE: Excruciating, unwilling or unable to urinate because of the pain.      Right lower abdominal 5/10 5. FEVER: "Do you have a fever?" If Yes, ask: "What is your temperature, how was it measured, and when did it  start?"     No 6. ASSOCIATED SYMPTOMS: "Are you passing urine more frequently than usual?"     Yes  7. OTHER SYMPTOMS: "Do you have any other symptoms?" (e.g., back/flank pain, abdomen pain, vomiting)     Back and flank pain, nausea  Protocols used: Urine - Blood In-A-AH dsode of   Reason for Disposition  [1] Pain or burning with passing urine AND [2] side (flank) or back pain present  Answer Assessment - Initial Assessment Questions 1. COLOR of URINE: "Describe the color of the urine."  (e.g., tea-colored, pink, red, bloody) "Do you have blood clots in your urine?" (e.g., none, pea, grape, small coin)     Red 2. ONSET: "When did the bleeding start?"      Yesterday 3. EPISODES: "How many times has there been blood in the urine?" or "How many times today?"     1 time today 4. PAIN with URINATION: "Is there any pain with passing your urine?" If Yes, ask: "How bad is the pain?"  (Scale 1-10; or mild, moderate, severe)    - MILD: Complains slightly about urination hurting.    - MODERATE: Interferes with normal activities.      - SEVERE: Excruciating, unwilling or unable to urinate because of the pain.      Right lower abdominal 5/10 5. FEVER: "Do you have a fever?" If Yes, ask: "What is your temperature, how was it measured, and when did it start?"     No 6. ASSOCIATED SYMPTOMS: "Are you passing urine more frequently than  usual?"     Yes  7. OTHER SYMPTOMS: "Do you have any other symptoms?" (e.g., back/flank pain, abdomen pain, vomiting)     Back and flank pain, nausea  Protocols used: Urine - Blood In-A-AH

## 2023-05-20 NOTE — Progress Notes (Signed)
Acute Office Visit   Patient: Sharon Nelson   DOB: 23-Jul-1951   72 y.o. Female  MRN: [id number] Visit Date: 05/20/2023  Today's healthcare provider: Oswaldo Conroy Idonna Heeren, PA-C  Introduced myself to the patient as a Secondary school teacher and provided education on APPs in clinical practice.    Chief Complaint  Patient presents with   Hematuria    blood in urine, ride side flank pain, lower abdominal pain, urine odor, pain with urination, increased frequency, nausea   Abdominal Pain   Flank Pain   Urinary Frequency   Subjective    HPI HPI     Hematuria    Additional comments: blood in urine, ride side flank pain, lower abdominal pain, urine odor, pain with urination, increased frequency, nausea      Last edited by Forde Radon, CMA on 05/20/2023  1:32 PM.      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    albuterol (VENTOLIN HFA) 108 (90 Base) MCG/ACT inhaler Inhale into the lungs every 4 (four) hours as needed.    aspirin 81 MG tablet Take 81 mg by mouth daily.    Cholecalciferol 2000 UNITS CAPS Take by mouth daily.    Continuous Blood Gluc Receiver (FREESTYLE LIBRE 2 READER) DEVI USE 1 DEVICE AS DIRECTED   donepezil (ARICEPT) 10 MG tablet TAKE 1 TABLET(10 MG) BY MOUTH DAILY   estradiol (ESTRACE) 0.5 MG tablet TAKE 1 TABLET(0.5 MG) BY MOUTH EVERY OTHER DAY   furosemide (LASIX) 20 MG tablet TAKE 1 TABLET(20 MG) BY MOUTH DAILY AS NEEDED FOR  SWELLING   gabapentin (NEURONTIN) 800 MG tablet TAKE 1 TABLET(800 MG) BY MOUTH THREE TIMES DAILY   glucose blood (ACCU-CHEK AVIVA PLUS) test strip Use as instructed   levocetirizine (XYZAL) 5 MG tablet Take 5 mg by mouth every evening.    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 REMOVE AND DISCARD AFTER 12 HOURS OR AS DIRECTED BY DOCTOR   rosuvastatin (CRESTOR) 5 MG tablet TAKE 1 TABLET(5 MG) BY MOUTH DAILY. REPLACES LOVASTATIN   Semaglutide, 1 MG/DOSE, 2 MG/1.5ML SOPN Inject 2 mg into the skin once a week. Mondays   spironolactone (ALDACTONE) 100 MG tablet TAKE 1 TABLET(100 MG) BY MOUTH DAILY   EPINEPHrine 0.3 mg/0.3 mL IJ SOAJ injection  (Patient not taking: Reported on 02/16/2023)   naproxen (NAPROSYN) 250 MG tablet Take 1 tablet (250 mg total) by mouth 2 (two) times daily with a meal. (Patient not taking: Reported on 01/12/2023)   predniSONE (DELTASONE) 20 MG tablet Take 2 tablets (40 mg total) by mouth daily with breakfast. (Patient not taking: Reported on 02/16/2023)   No facility-administered medications prior to visit.    Review of Systems  Constitutional:  Negative for chills, diaphoresis and fever.  Gastrointestinal:  Positive for abdominal pain and nausea.  Genitourinary:  Positive for dysuria, flank pain, frequency and hematuria.  Negative for decreased urine volume and difficulty urinating.  Neurological:  Negative for headaches.        Objective    BP 138/70   Pulse 84   Temp 98.4 F (36.9 C) (Oral)   Resp 16   Ht 4\' 11"  (1.499 m)   Wt 150 lb 11.2 oz (68.4 kg)   SpO2 98%   BMI 30.44 kg/m     Physical Exam Vitals reviewed.  Constitutional:      General: She is awake.     Appearance: Normal appearance. She is well-developed and well-groomed.  HENT:     Head: Normocephalic and atraumatic.  Pulmonary:     Effort: Pulmonary effort is normal.     Breath sounds: Normal breath sounds.  Abdominal:     General: Abdomen  is flat. Bowel sounds are normal.     Palpations: Abdomen is soft.     Tenderness: There is abdominal tenderness in the suprapubic area. There is right CVA tenderness.  Neurological:     Mental Status: She is alert.  Psychiatric:        Attention and Perception: Attention and perception normal.        Mood and Affect: Mood normal.        Speech: Speech normal.        Behavior: Behavior normal. Behavior is cooperative.       Results for orders placed or performed in visit on 05/20/23  POCT urinalysis dipstick  Result Value Ref Range   Color, UA Turbid    Clarity, UA Cloudy    Glucose, UA Negative Negative   Bilirubin, UA Negative    Ketones, UA Small    Spec Grav, UA 1.020 1.010 - 1.025   Blood, UA LARGE    pH, UA 6.5 5.0 - 8.0   Protein, UA Positive (A) Negative   Urobilinogen, UA 0.2 0.2 or 1.0 E.U./dL   Nitrite, UA Large    Leukocytes, UA Large (3+) (A) Negative   Appearance Cloudy    Odor FOUL     Assessment & Plan      No follow-ups on file.       Problem List Items Addressed This Visit   None Visit Diagnoses     Urinary tract infection with hematuria, site unspecified    -  Primary Acute, new problem Patient reports symptoms comprised of the following: dysuria, incomplete voiding, increased urinary frequency, right sided flank pain, suprapubic pain, urinary hesitancy Results of UA are consistent with UTI - urine sample sent for culture to determine causative organism and susceptibility- results to dictate further management  UA results were reviewed with her during apt Will start Macrobid 100 mg PO BID x 5 days along with Pyridium as needed for discomfort  discussed importance of finishing entire course of abx and staying well hydrated while recovering from UTI Reviewed ED and return precautions with patient Follow up as needed for persistent or worsening symptoms    Relevant Medications   phenazopyridine (PYRIDIUM) 100 MG tablet   nitrofurantoin,  macrocrystal-monohydrate, (MACROBID) 100 MG capsule   Hematuria, unspecified type       Relevant Orders   POCT urinalysis dipstick (Completed)   Urine Culture        No follow-ups on file.   I, Hadlyn Amero E Chaitanya Amedee, PA-C, have reviewed all documentation for this visit. The documentation on 05/20/23 for the exam, diagnosis, procedures, and orders are all accurate and complete.   Jacquelin Hawking, MHS, PA-C [facility]  [facility]

## 2023-05-22 LAB — URINE CULTURE
MICRO NUMBER:: 15642099
SPECIMEN QUALITY:: ADEQUATE

## 2023-05-24 NOTE — Progress Notes (Signed)
Your urine culture was positive for E coli which was sensitive to the antibiotic you were prescribed at your apt.  Please make sure you finish the entire course of the medication and stay well hydrated Let us know if you have further questions or concerns

## 2023-05-26 ENCOUNTER — Ambulatory Visit: Payer: Self-pay

## 2023-05-26 NOTE — Telephone Encounter (Signed)
Message from Embden C sent at 05/26/2023  2:42 PM EDT  Summary: rx req / personal discomfort   The patient shares that they have recently completed a round of antibiotics for urinary discomfort/bladder infection  The patient shares that they completed their medication on 05/24/23 and would like to be prescribed something for their discomfort  Please contact further when possible         Chief Complaint: vaginal itching Symptoms: itching Frequency: Sunday Pertinent Negatives: Patient denies discharge, abd pain or bleeding Disposition: [] ED /[] Urgent Care (no appt availability in office) / [x] Appointment(In office/virtual)/ []  [place] [facility]/ [] Home Care/ [] Refused Recommended Disposition /[] [place] [facility]/ []  Follow-up with PCP Additional Notes: VV made. Pt just finished abx for UTI- made appt unclear if PCP would send anything po in and not need visit Reason for Disposition  [1] Symptoms of a yeast infection (i.e., itchy, white discharge, not bad smelling) AND [2] feels like prior vaginal yeast infections  Answer Assessment - Initial Assessment Questions 1. SYMPTOM: "What's the main symptom you're concerned about?" (e.g., pain, itching, dryness)     Itching  2. LOCATION: "Where is the  itching located?" (e.g., inside/outside, left/right)     inside 3. ONSET: "When did the  itching  start?"     Sun  4. PAIN: "Is there any pain?" If Yes, ask: "How bad is it?" (Scale: 1-10; mild, moderate, severe)   -  MILD (1-3): Doesn't interfere with normal activities.    -  MODERATE (4-7): Interferes with normal activities (e.g., work or school) or awakens from sleep.     -  SEVERE (8-10): Excruciating pain, unable to do any normal activities.     no 5. ITCHING: "Is there any itching?" If Yes, ask: "How bad is it?" (Scale: 1-10; mild, moderate, severe)     moderate 6. CAUSE: "What do you think is causing the discharge?" "Have you had the same problem before? What happened  then?"     no 7. OTHER SYMPTOMS: "Do you have any other symptoms?" (e.g., fever, itching, vaginal bleeding, pain with urination, injury to genital area, vaginal foreign body)     Irritating  8. PREGNANCY: "Is there any chance you are pregnant?" "When was your last menstrual period?"     N/a  Protocols used: Vaginal Symptoms-A-AH

## 2023-05-26 NOTE — Telephone Encounter (Signed)
Message from Vonore C sent at 05/26/2023  2:42 PM EDT  Summary: rx req / personal discomfort   The patient shares that they have recently completed a round of antibiotics for urinary discomfort/bladder infection  The patient shares that they completed their medication on 05/24/23 and would like to be prescribed something for their discomfort  Please contact further when possible        Called pt and unable to LM, mailbox full.

## 2023-05-27 ENCOUNTER — Telehealth: Payer: BC Managed Care – PPO | Admitting: Physician Assistant

## 2023-05-27 MED ORDER — FLUCONAZOLE 150 MG PO TABS: 150.0000 mg | ORAL_TABLET | Freq: Once | ORAL | 0 refills | Status: AC

## 2023-05-27 NOTE — Telephone Encounter (Signed)
Have sent prescription for diflucan

## 2023-05-27 NOTE — Telephone Encounter (Signed)
Patient advised prescription sent in. Cancelled appointment for today.

## 2023-05-27 NOTE — Addendum Note (Signed)
Addended by: Malva Limes on: 05/27/2023 07:34 AM   Modules accepted: Orders

## 2023-05-31 DIAGNOSIS — Z96651 Presence of right artificial knee joint: Secondary | ICD-10-CM | POA: Diagnosis not present

## 2023-05-31 DIAGNOSIS — M25461 Effusion, right knee: Secondary | ICD-10-CM | POA: Diagnosis not present

## 2023-05-31 DIAGNOSIS — Z471 Aftercare following joint replacement surgery: Secondary | ICD-10-CM | POA: Diagnosis not present

## 2023-06-09 ENCOUNTER — Other Ambulatory Visit: Payer: Self-pay | Admitting: Family Medicine

## 2023-06-09 DIAGNOSIS — N951 Menopausal and female climacteric states: Secondary | ICD-10-CM

## 2023-07-03 ENCOUNTER — Other Ambulatory Visit: Payer: Self-pay | Admitting: Family Medicine

## 2023-07-06 NOTE — Telephone Encounter (Signed)
Requested Prescriptions  Pending Prescriptions Disp Refills   furosemide (LASIX) 20 MG tablet [Pharmacy Med Name: FUROSEMIDE 20MG  TABLETS] 90 tablet 0    Sig: TAKE 1 TABLET(20 MG) BY MOUTH DAILY AS NEEDED FOR SWELLING     Cardiovascular:  Diuretics - Loop Failed - 07/03/2023 12:52 PM      Failed - Cr in normal range and within 180 days    Creatinine  Date Value Ref Range Status  02/12/2014 1.04 0.60 - 1.30 mg/dL Final   Creatinine, Ser  Date Value Ref Range Status  02/23/2023 1.18 (H) 0.57 - 1.00 mg/dL Final   Creatinine, POC  Date Value Ref Range Status  05/15/2019 n/a mg/dL Final         Failed - Mg Level in normal range and within 180 days    Magnesium  Date Value Ref Range Status  08/12/2022 2.1 1.6 - 2.3 mg/dL Final  91/47/8295 2.2 mg/dL Final    Comment:    6.2-1.3 THERAPEUTIC RANGE: 4-7 mg/dL TOXIC: > 10 mg/dL  -----------------------          Passed - K in normal range and within 180 days    Potassium  Date Value Ref Range Status  02/23/2023 4.2 3.5 - 5.2 mmol/L Final  02/12/2014 4.4 3.5 - 5.1 mmol/L Final         Passed - Ca in normal range and within 180 days    Calcium  Date Value Ref Range Status  02/23/2023 9.6 8.7 - 10.3 mg/dL Final   Calcium, Total  Date Value Ref Range Status  02/12/2014 8.0 (L) 8.5 - 10.1 mg/dL Final         Passed - Na in normal range and within 180 days    Sodium  Date Value Ref Range Status  02/23/2023 139 134 - 144 mmol/L Final  02/12/2014 142 136 - 145 mmol/L Final         Passed - Cl in normal range and within 180 days    Chloride  Date Value Ref Range Status  02/23/2023 102 96 - 106 mmol/L Final  02/12/2014 112 (H) 98 - 107 mmol/L Final         Passed - Last BP in normal range    BP Readings from Last 1 Encounters:  05/20/23 138/70         Passed - Valid encounter within last 6 months    Recent Outpatient Visits           1 month ago Urinary tract infection with hematuria, site unspecified   [place]  [facility] Mecum, Erin E, PA-C   4 months ago Chronic kidney disease, stage 3b [facility])   [place] [facility] Malva Limes, MD   5 months ago Spasm of muscle of lower back   [facility], Monico Blitz, DO   6 months ago Spasm of muscle of lower back   [facility] [place], Monico Blitz, DO   10 months ago Chronic kidney disease, stage 3b [facility])   [place] [facility] Malva Limes, MD       Future Appointments             In 1 month Fisher, Demetrios Isaacs, MD [facility]

## 2023-07-08 ENCOUNTER — Telehealth: Payer: Self-pay | Admitting: Family Medicine

## 2023-07-08 NOTE — Telephone Encounter (Signed)
Reminder call- Parking Placard was completed & ready for pick-up on 04/18/2023.

## 2023-07-12 ENCOUNTER — Ambulatory Visit (INDEPENDENT_AMBULATORY_CARE_PROVIDER_SITE_OTHER): Payer: Medicare Other | Admitting: Family Medicine

## 2023-07-12 DIAGNOSIS — Z23 Encounter for immunization: Secondary | ICD-10-CM

## 2023-07-12 NOTE — Progress Notes (Signed)
Vaccine administration only. No E&M service today.   

## 2023-07-20 IMAGING — CR DG SINUSES COMPLETE 3+V
1 series · 5 of 5 positions shown · non-contrast
Comparison: 04/27/2017

CLINICAL DATA: Maxillary and facial pain on the right.

EXAM:
PARANASAL SINUSES - COMPLETE 3 + VIEW

[Series 1: dg sinuses complete · 0.14mm/px · 5 of 5 slices shown]
[im 1/5]
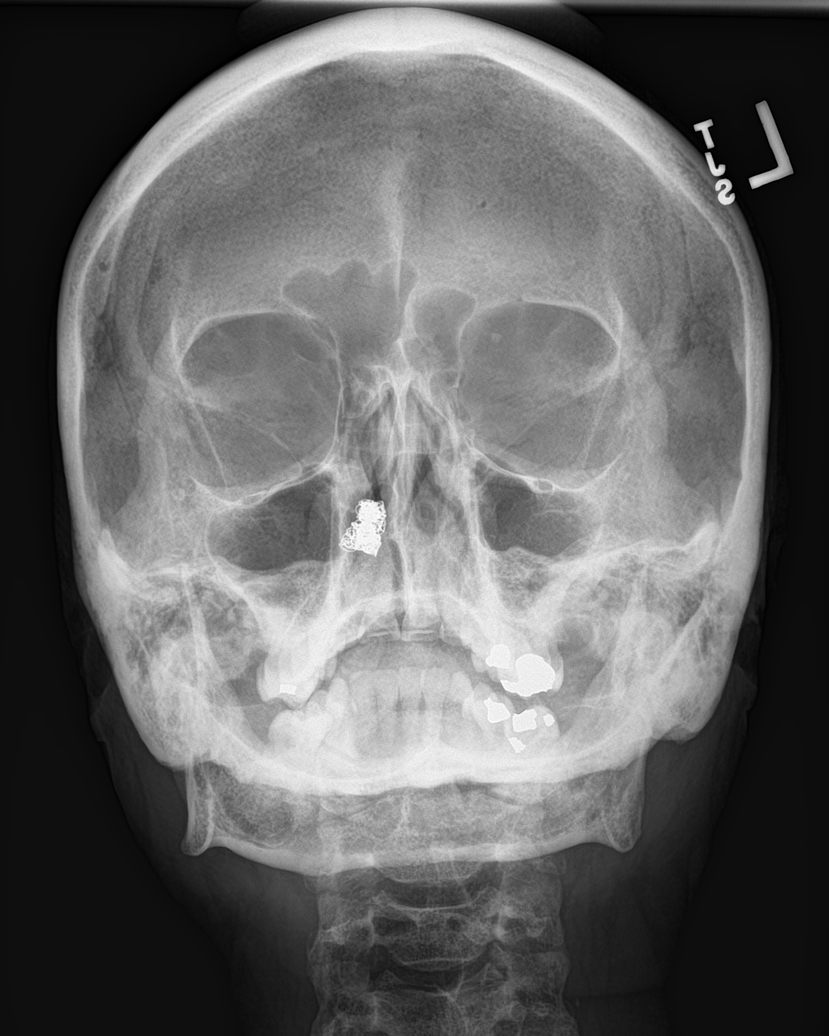
[im 2/5]
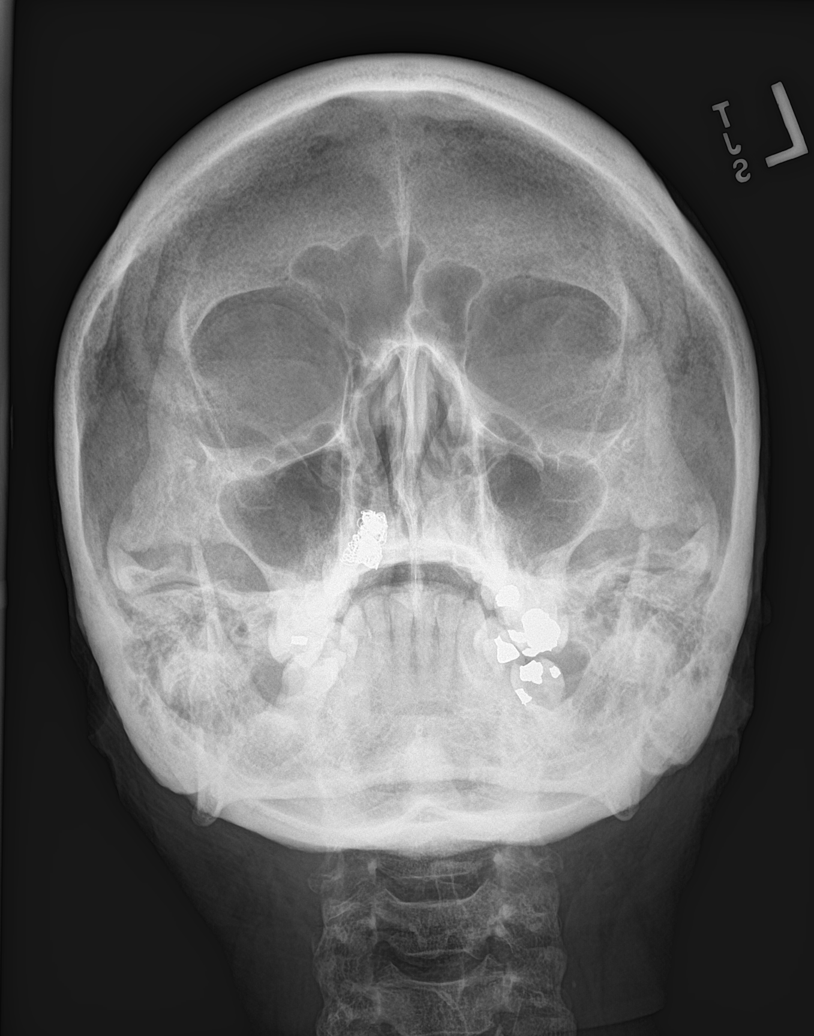
[im 3/5]
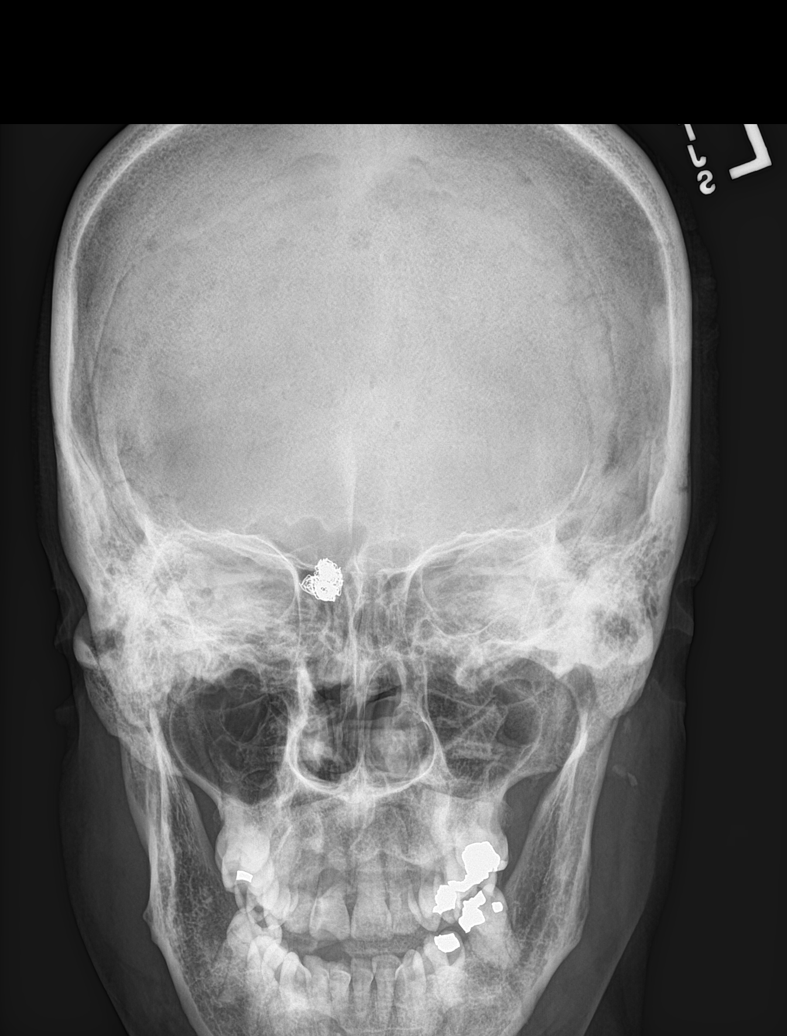
[im 4/5]
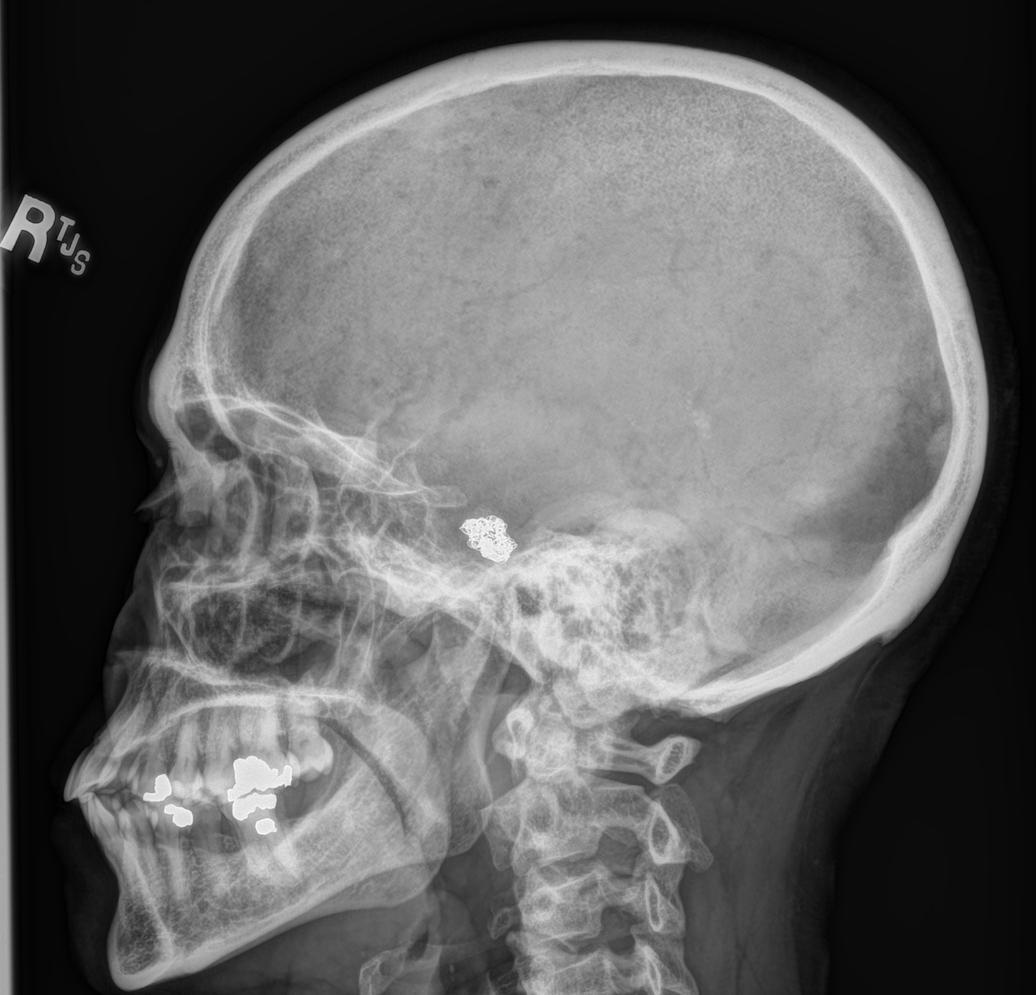
[im 5/5]
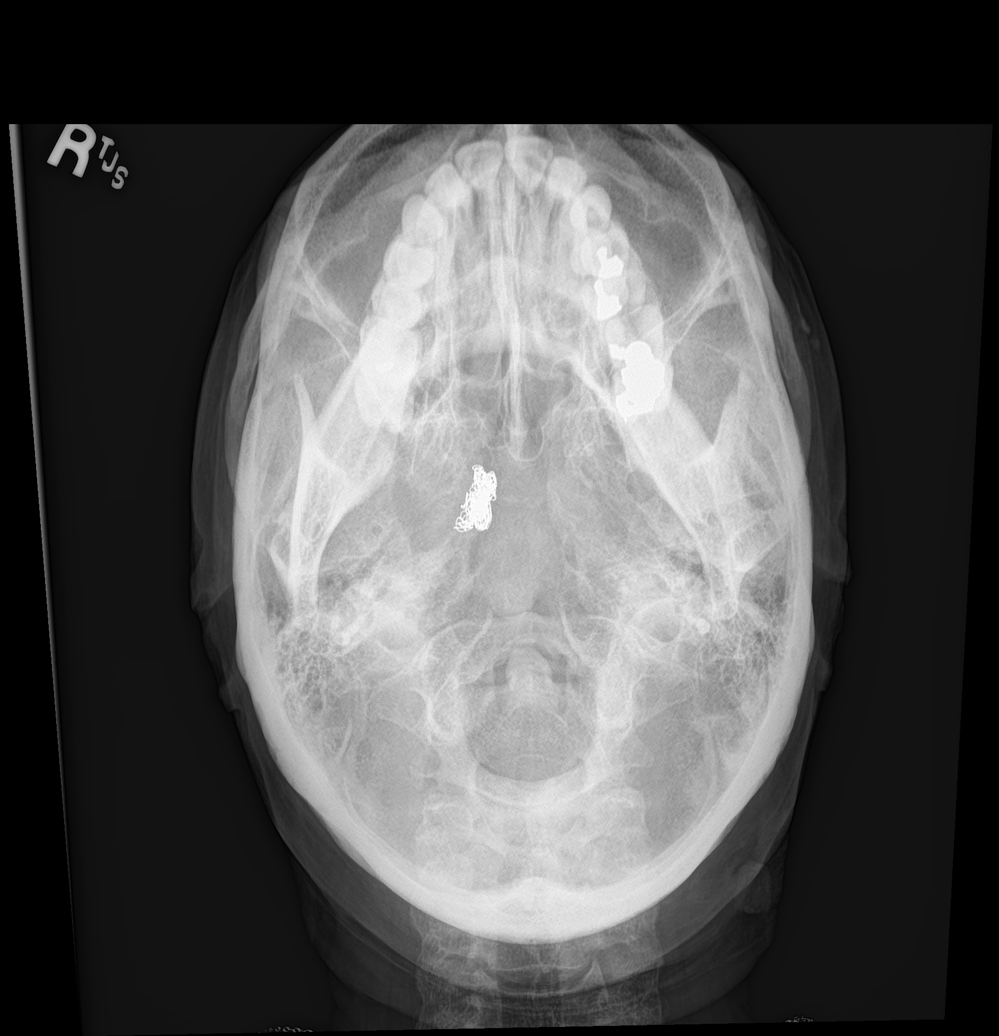

[5 of 5 positions shown; findings below may reference images not displayed]

FINDINGS: The paranasal sinus are aerated. There is no evidence of sinus
opacification air-fluid levels or mucosal thickening. No significant
bone abnormalities are seen.

Embolic coils in place at the skull base region on the right.
IMPRESSION: Negative radiographs.  The sinuses are clear.

Embolic coils in place at the skull base region on the right.

## 2023-07-23 DIAGNOSIS — E119 Type 2 diabetes mellitus without complications: Secondary | ICD-10-CM | POA: Diagnosis not present

## 2023-07-23 DIAGNOSIS — Z961 Presence of intraocular lens: Secondary | ICD-10-CM | POA: Diagnosis not present

## 2023-07-23 DIAGNOSIS — E113291 Type 2 diabetes mellitus with mild nonproliferative diabetic retinopathy without macular edema, right eye: Secondary | ICD-10-CM | POA: Diagnosis not present

## 2023-07-23 DIAGNOSIS — H2512 Age-related nuclear cataract, left eye: Secondary | ICD-10-CM | POA: Diagnosis not present

## 2023-07-23 LAB — HM DIABETES EYE EXAM

## 2023-08-03 DIAGNOSIS — I152 Hypertension secondary to endocrine disorders: Secondary | ICD-10-CM | POA: Diagnosis not present

## 2023-08-03 DIAGNOSIS — E1159 Type 2 diabetes mellitus with other circulatory complications: Secondary | ICD-10-CM | POA: Diagnosis not present

## 2023-08-03 DIAGNOSIS — E1169 Type 2 diabetes mellitus with other specified complication: Secondary | ICD-10-CM | POA: Diagnosis not present

## 2023-08-03 DIAGNOSIS — E1165 Type 2 diabetes mellitus with hyperglycemia: Secondary | ICD-10-CM | POA: Diagnosis not present

## 2023-08-03 DIAGNOSIS — E785 Hyperlipidemia, unspecified: Secondary | ICD-10-CM | POA: Diagnosis not present

## 2023-08-03 LAB — HEMOGLOBIN A1C: Hemoglobin A1C: 6.7

## 2023-08-12 ENCOUNTER — Other Ambulatory Visit: Payer: Self-pay | Admitting: Family Medicine

## 2023-08-12 DIAGNOSIS — R413 Other amnesia: Secondary | ICD-10-CM

## 2023-08-13 NOTE — Telephone Encounter (Signed)
Requested Prescriptions  Pending Prescriptions Disp Refills   donepezil (ARICEPT) 10 MG tablet [Pharmacy Med Name: DONEPEZIL 10MG  TABLETS] 90 tablet 0    Sig: TAKE 1 TABLET(10 MG) BY MOUTH DAILY     Neurology:  Alzheimer's Agents Passed - 08/13/2023  8:40 AM      Passed - Valid encounter within last 6 months    Recent Outpatient Visits           1 month ago Immunization due   [facility] Malva Limes, MD   2 months ago Urinary tract infection with hematuria, site unspecified   [place] [facility] Mecum, Erin E, PA-C   5 months ago Chronic kidney disease, stage 3b [facility])   [place] [facility] Malva Limes, MD   7 months ago Spasm of muscle of lower back   [facility], Monico Blitz, DO   7 months ago Spasm of muscle of lower back   [facility] [place], Monico Blitz, DO       Future Appointments             In 2 weeks Sherrie Mustache, Demetrios Isaacs, MD [facility], [facility]

## 2023-08-27 ENCOUNTER — Encounter: Payer: Self-pay | Admitting: Family Medicine

## 2023-08-27 ENCOUNTER — Ambulatory Visit (INDEPENDENT_AMBULATORY_CARE_PROVIDER_SITE_OTHER): Payer: Medicare Other | Admitting: Family Medicine

## 2023-08-27 VITALS — BP 134/58 | HR 60 | Resp 16 | Ht 59.0 in | Wt 154.7 lb

## 2023-08-27 DIAGNOSIS — Z7985 Long-term (current) use of injectable non-insulin antidiabetic drugs: Secondary | ICD-10-CM

## 2023-08-27 DIAGNOSIS — N1832 Chronic kidney disease, stage 3b: Secondary | ICD-10-CM | POA: Diagnosis not present

## 2023-08-27 DIAGNOSIS — E1122 Type 2 diabetes mellitus with diabetic chronic kidney disease: Secondary | ICD-10-CM

## 2023-08-27 DIAGNOSIS — I10 Essential (primary) hypertension: Secondary | ICD-10-CM

## 2023-08-27 DIAGNOSIS — K760 Fatty (change of) liver, not elsewhere classified: Secondary | ICD-10-CM | POA: Diagnosis not present

## 2023-08-27 DIAGNOSIS — E559 Vitamin D deficiency, unspecified: Secondary | ICD-10-CM

## 2023-08-27 DIAGNOSIS — E7800 Pure hypercholesterolemia, unspecified: Secondary | ICD-10-CM | POA: Diagnosis not present

## 2023-08-27 NOTE — Progress Notes (Signed)
Established patient visit   Patient: Sharon Nelson   DOB: 12-14-1950   73 y.o. Female  MRN: [id number] Visit Date: 08/27/2023  Today's healthcare provider: Mila Merry, MD   Chief Complaint  Patient presents with   Diabetes   Hyperlipidemia   Hypertension   Subjective    Discussed the use of AI scribe software for clinical note transcription with the patient, who gave verbal consent to proceed.  History of Present Illness   The patient presents for a follow-up on blood pressure and blood work.  She is working on lowering her blood sugar levels with the help of her endocrinologist Dr. Gershon Crane, whom she saw recently.  She has ongoing gastrointestinal issues and needs to follow up with her gastroenterologist, Dr. Allegra Lai, whom she last saw a year ago. she was seen for IBS and alpha-gal syndrome and on avoidance diet.  Despite these efforts, symptoms persist.  She reports no issues or side effects with her medications. She takes Estrace every other day, noting it works fine, though she sometimes skips doses. Gabapentin has helped reduce her pain and migraines. She experienced a recent headache episode triggered by sunlight and strong smells, consistent with her migraine history.  She continues Advair prescribed by Dr. [place]. Strong smells may still trigger headaches and affect her breathing, but the inhaler helps keep her airways open.  She experiences occasional shortness of breath, attributed to decreased physical activity during winter. She attends a line dance class and plans to start walking more to improve fitness. No chest pain, heart flutters, or significant shortness of breath.     Lab Results  Component Value Date   NA 139 02/23/2023   K 4.2 02/23/2023   CREATININE 1.18 (H) 02/23/2023   EGFR 48 (L) 02/23/2023   GLUCOSE 110 (H) 02/23/2023   Lab Results  Component Value Date   HGBA1C 6.7 08/03/2023   Lab Results  Component Value Date   CHOL 200 (H) 02/23/2023    HDL 51 02/23/2023   LDLCALC 119 (H) 02/23/2023   TRIG 172 (H) 02/23/2023   CHOLHDL 3.9 02/23/2023     Medications: Outpatient Medications Prior to Visit  Medication Sig   ADVAIR HFA 115-21 MCG/ACT inhaler Inhale 2 puffs into the lungs 2 (two) times daily.    albuterol (VENTOLIN HFA) 108 (90 Base) MCG/ACT inhaler Inhale into the lungs every 4 (four) hours as needed.    aspirin 81 MG tablet Take 81 mg by mouth daily.    Cholecalciferol 2000 UNITS CAPS Take by mouth daily.    Continuous Glucose Sensor (FREESTYLE LIBRE 3 PLUS SENSOR) MISC USE 1 EVERY 14 DAYS   donepezil (ARICEPT) 10 MG tablet TAKE 1 TABLET(10 MG) BY MOUTH DAILY   EPINEPHrine 0.3 mg/0.3 mL IJ SOAJ injection    estradiol (ESTRACE) 0.5 MG tablet TAKE 1 TABLET(0.5 MG) BY MOUTH EVERY OTHER DAY   furosemide (LASIX) 20 MG tablet TAKE 1 TABLET(20 MG) BY MOUTH DAILY AS NEEDED FOR SWELLING   gabapentin (NEURONTIN) 800 MG tablet TAKE 1 TABLET(800 MG) BY MOUTH THREE TIMES DAILY   glucose blood (ACCU-CHEK AVIVA PLUS) test strip Use as instructed   levocetirizine (XYZAL) 5 MG tablet Take 5 mg by mouth every evening.    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 REMOVE AND DISCARD AFTER 12 HOURS OR AS DIRECTED BY DOCTOR   naproxen (NAPROSYN) 250 MG tablet Take 1 tablet (250 mg total) by mouth  2 (two) times daily with a meal.   phenazopyridine (PYRIDIUM) 100 MG tablet Take 1 tablet (100 mg total) by mouth 3 (three) times daily as needed for pain.   rosuvastatin (CRESTOR) 5 MG tablet TAKE 1 TABLET(5 MG) BY MOUTH DAILY. REPLACES LOVASTATIN   Semaglutide, 1 MG/DOSE, 2 MG/1.5ML SOPN Inject 2 mg into the skin once a week. Mondays   spironolactone (ALDACTONE) 100 MG tablet TAKE 1 TABLET(100 MG) BY MOUTH DAILY   No facility-administered medications prior to visit.   Review of Systems  Constitutional:  Negative for appetite change, chills, fatigue and fever.  Respiratory:  Negative for chest  tightness and shortness of breath.   Cardiovascular:  Negative for chest pain and palpitations.  Gastrointestinal:  Negative for abdominal pain, nausea and vomiting.  Neurological:  Negative for dizziness and weakness.       Objective    BP (!) 134/58 (BP Location: Left Arm, Patient Position: Sitting, Cuff Size: Normal)   Pulse 60   Resp 16   Ht 4\' 11"  (1.499 m)   Wt 154 lb 11.2 oz (70.2 kg)   BMI 31.25 kg/m   Physical Exam  General appearance: Overweight female, cooperative and in no acute distress Head: Normocephalic, without obvious abnormality, atraumatic Respiratory: Respirations even and unlabored, normal respiratory rate Extremities: All extremities are intact.  Skin: Skin color, texture, turgor normal. No rashes seen  Psych: Appropriate mood and affect. Neurologic: Mental status: Alert, oriented to person, place, and time, thought content appropriate.     Results for orders placed or performed in visit on 08/27/23  Hemoglobin A1c  Result Value Ref Range   Hemoglobin A1C 6.7     Assessment & Plan       Chronic kidney disease 3b and hyperlipidemia.  -Order labs to check kidney function. -Continue rosuvastatin   Gastrointestinal Issues Patient reports ongoing issues despite previous diagnosis of AfGal syndrome. Patient has not seen GI specialist, Dr. Allegra Lai, in a year and is due for a follow-up. -Recommend patient to schedule a follow-up appointment with Dr. Allegra Lai.  Menopausal Symptoms Patient is currently on Estrace, but is considering stopping the medication to assess if it is still needed. -Advise patient to trial stopping Estrace and monitor for any changes in symptoms.  Asthma Patient reports reliance on inhalers due to cessation of allergy shots. Patient also reports occasional shortness of breath, potentially exacerbated by lack of exercise in winter months. -Continue current inhaler regimen. -Encourage patient to maintain regular exercise.  Hypertension  and Diabetes Patient is currently under the care of Dr. Gershon Crane for diabetes management. No specific concerns raised during this visit. -No changes to current management plan.    Return in about 6 months (around 02/24/2024) for Hypertension.      Mila Merry, MD  [facility] [phone] (phone) [phone] (fax)  [facility]

## 2023-08-27 NOTE — Patient Instructions (Addendum)
Please review the attached list of medications and notify my office if there are any errors.   Call [facility] at [phone] to schedule a follow up appointment with Dr. Allegra Lai

## 2023-08-28 LAB — COMPREHENSIVE METABOLIC PANEL
ALT: 13 [IU]/L (ref 0–32)
AST: 15 [IU]/L (ref 0–40)
Albumin: 4.3 g/dL (ref 3.8–4.8)
Alkaline Phosphatase: 73 [IU]/L (ref 44–121)
BUN/Creatinine Ratio: 13 (ref 12–28)
BUN: 15 mg/dL (ref 8–27)
Bilirubin Total: <0.2 mg/dL (ref 0.0–1.2)
CO2: 24 mmol/L (ref 20–29)
Calcium: 9.4 mg/dL (ref 8.7–10.3)
Chloride: 104 mmol/L (ref 96–106)
Creatinine, Ser: 1.16 mg/dL — ABNORMAL HIGH (ref 0.57–1.00)
Globulin, Total: 2.7 g/dL (ref 1.5–4.5)
Glucose: 108 mg/dL — ABNORMAL HIGH (ref 70–99)
Potassium: 5 mmol/L (ref 3.5–5.2)
Sodium: 141 mmol/L (ref 134–144)
Total Protein: 7 g/dL (ref 6.0–8.5)
eGFR: 49 mL/min/{1.73_m2} — ABNORMAL LOW (ref >59)

## 2023-08-28 LAB — LIPID PANEL
Chol/HDL Ratio: 5.5 {ratio} — ABNORMAL HIGH (ref 0.0–4.4)
Cholesterol, Total: 231 mg/dL — ABNORMAL HIGH (ref 100–199)
HDL: 42 mg/dL (ref >39)
LDL Chol Calc (NIH): 151 mg/dL — ABNORMAL HIGH (ref 0–99)
Triglycerides: 207 mg/dL — ABNORMAL HIGH (ref 0–149)
VLDL Cholesterol Cal: 38 mg/dL (ref 5–40)

## 2023-08-29 ENCOUNTER — Encounter: Payer: Self-pay | Admitting: Family Medicine

## 2023-09-07 ENCOUNTER — Telehealth: Payer: Self-pay | Admitting: Gastroenterology

## 2023-09-07 NOTE — Telephone Encounter (Signed)
Patient was calling to make follow up appointment made appointment for 11/11/2023 at 1

## 2023-09-07 NOTE — Telephone Encounter (Signed)
Pt requesting call back left vm didn't leave any details

## 2023-09-24 ENCOUNTER — Other Ambulatory Visit: Payer: Self-pay | Admitting: Family Medicine

## 2023-09-24 DIAGNOSIS — E1169 Type 2 diabetes mellitus with other specified complication: Secondary | ICD-10-CM

## 2023-09-24 MED ORDER — ROSUVASTATIN CALCIUM 10 MG PO TABS
10.0000 mg | ORAL_TABLET | Freq: Every day | ORAL | 1 refills | Status: DC
Start: 2023-09-24 — End: 2024-05-08

## 2023-09-30 ENCOUNTER — Other Ambulatory Visit: Payer: Self-pay | Admitting: Family Medicine

## 2023-10-01 NOTE — Telephone Encounter (Signed)
 Requested Prescriptions  Pending Prescriptions Disp Refills   furosemide (LASIX) 20 MG tablet [Pharmacy Med Name: FUROSEMIDE 20MG  TABLETS] 90 tablet 0    Sig: TAKE 1 TABLET(20 MG) BY MOUTH DAILY AS NEEDED FOR SWELLING     Cardiovascular:  Diuretics - Loop Failed - 10/01/2023  9:13 AM      Failed - Cr in normal range and within 180 days    Creatinine  Date Value Ref Range Status  02/12/2014 1.04 0.60 - 1.30 mg/dL Final   Creatinine, Ser  Date Value Ref Range Status  08/27/2023 1.16 (H) 0.57 - 1.00 mg/dL Final   Creatinine, POC  Date Value Ref Range Status  05/15/2019 n/a mg/dL Final         Failed - Mg Level in normal range and within 180 days    Magnesium  Date Value Ref Range Status  08/12/2022 2.1 1.6 - 2.3 mg/dL Final  21/30/8657 2.2 mg/dL Final    Comment:    8.4-6.9 THERAPEUTIC RANGE: 4-7 mg/dL TOXIC: > 10 mg/dL  -----------------------          Passed - K in normal range and within 180 days    Potassium  Date Value Ref Range Status  08/27/2023 5.0 3.5 - 5.2 mmol/L Final  02/12/2014 4.4 3.5 - 5.1 mmol/L Final         Passed - Ca in normal range and within 180 days    Calcium  Date Value Ref Range Status  08/27/2023 9.4 8.7 - 10.3 mg/dL Final   Calcium, Total  Date Value Ref Range Status  02/12/2014 8.0 (L) 8.5 - 10.1 mg/dL Final         Passed - Na in normal range and within 180 days    Sodium  Date Value Ref Range Status  08/27/2023 141 134 - 144 mmol/L Final  02/12/2014 142 136 - 145 mmol/L Final         Passed - Cl in normal range and within 180 days    Chloride  Date Value Ref Range Status  08/27/2023 104 96 - 106 mmol/L Final  02/12/2014 112 (H) 98 - 107 mmol/L Final         Passed - Last BP in normal range    BP Readings from Last 1 Encounters:  08/27/23 (!) 134/58         Passed - Valid encounter within last 6 months    Recent Outpatient Visits           1 month ago Essential (primary) hypertension   [place] [facility] Malva Limes, MD   2 months ago Immunization due   [facility] Malva Limes, MD   4 months ago Urinary tract infection with hematuria, site unspecified   [place] [facility] Mecum, Erin E, PA-C   7 months ago Chronic kidney disease, stage 3b [facility])   [place] [facility] Malva Limes, MD   8 months ago Spasm of muscle of lower back   [facility] [place], Monico Blitz, DO       Future Appointments             In 1 month Vanga, Loel Dubonnet, MD [facility]

## 2023-11-01 ENCOUNTER — Other Ambulatory Visit: Payer: Self-pay | Admitting: Family Medicine

## 2023-11-01 DIAGNOSIS — G500 Trigeminal neuralgia: Secondary | ICD-10-CM

## 2023-11-01 DIAGNOSIS — G43019 Migraine without aura, intractable, without status migrainosus: Secondary | ICD-10-CM

## 2023-11-01 DIAGNOSIS — M542 Cervicalgia: Secondary | ICD-10-CM

## 2023-11-04 ENCOUNTER — Other Ambulatory Visit: Payer: Self-pay

## 2023-11-10 DIAGNOSIS — T781XXA Other adverse food reactions, not elsewhere classified, initial encounter: Secondary | ICD-10-CM | POA: Insufficient documentation

## 2023-11-10 DIAGNOSIS — J300 Vasomotor rhinitis: Secondary | ICD-10-CM | POA: Insufficient documentation

## 2023-11-11 ENCOUNTER — Ambulatory Visit: Payer: Medicare Other | Admitting: Gastroenterology

## 2023-11-15 ENCOUNTER — Other Ambulatory Visit: Payer: Self-pay | Admitting: Family Medicine

## 2023-11-15 DIAGNOSIS — R413 Other amnesia: Secondary | ICD-10-CM

## 2023-11-16 ENCOUNTER — Other Ambulatory Visit: Payer: Self-pay | Admitting: Family Medicine

## 2023-11-19 ENCOUNTER — Other Ambulatory Visit: Payer: Self-pay | Admitting: Family Medicine

## 2023-11-19 DIAGNOSIS — R413 Other amnesia: Secondary | ICD-10-CM

## 2023-11-30 ENCOUNTER — Ambulatory Visit (INDEPENDENT_AMBULATORY_CARE_PROVIDER_SITE_OTHER): Admitting: Gastroenterology

## 2023-11-30 ENCOUNTER — Encounter: Payer: Self-pay | Admitting: Gastroenterology

## 2023-11-30 VITALS — BP 132/67 | HR 77 | Temp 97.6°F | Ht 59.0 in | Wt 150.0 lb

## 2023-11-30 DIAGNOSIS — Z91018 Allergy to other foods: Secondary | ICD-10-CM

## 2023-11-30 DIAGNOSIS — R195 Other fecal abnormalities: Secondary | ICD-10-CM | POA: Diagnosis not present

## 2023-11-30 DIAGNOSIS — K9049 Malabsorption due to intolerance, not elsewhere classified: Secondary | ICD-10-CM

## 2023-11-30 DIAGNOSIS — E8809 Other disorders of plasma-protein metabolism, not elsewhere classified: Secondary | ICD-10-CM

## 2023-11-30 DIAGNOSIS — R152 Fecal urgency: Secondary | ICD-10-CM | POA: Diagnosis not present
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
## 2023-12-01 ENCOUNTER — Telehealth: Payer: Self-pay

## 2023-12-01 MED ORDER — CHOLESTYRAMINE 4 G PO PACK: 4.0000 g | PACK | Freq: Two times a day (BID) | ORAL | 0 refills | Status: AC

## 2023-12-01 NOTE — Telephone Encounter (Signed)
 Patient called back and verbalized understanding.

## 2023-12-01 NOTE — Telephone Encounter (Signed)
-----   Message from [facility] sent at 11/30/2023  4:38 PM EDT ----- Regarding: Cholestyramine Sharon Nelson  Please send in prescription for cholestyramine 2 g packet 1-2 times daily for 2 weeks and she should let us  know if this medication helps before we do further refills  RV

## 2023-12-01 NOTE — Telephone Encounter (Signed)
 Sent medication to the pharmacy and informed patient the instructions. She states that she will try the medication and let us  know if it helps. She states at the visit you told her about getting a oatmeal but she does not remember the name of the oatmeal you said to buy?

## 2023-12-01 NOTE — Telephone Encounter (Signed)
 Called and left a message for call back

## 2023-12-01 NOTE — Telephone Encounter (Signed)
 This comes in a 4 gram packet not 2 grams. Is it okay to send for 4 grams?

## 2023-12-05 ENCOUNTER — Other Ambulatory Visit: Payer: Self-pay | Admitting: Gastroenterology

## 2023-12-08 ENCOUNTER — Ambulatory Visit: Payer: Self-pay

## 2023-12-08 NOTE — Telephone Encounter (Signed)
 Copied from CRM [id number]. Topic: Clinical - Red Word Triage >> Dec 08, 2023  8:26 AM Antwanette L wrote: Red Word that prompted transfer to Nurse Triage: Patient is having severe left abdomen pain.   Chief Complaint: Left lower abdominal pain  Symptoms: gradual abd pain since Monday Frequency: constant Pertinent Negatives: Patient denies diarrhea Disposition: [] ED /[x] Urgent Care (no appt availability in office) / [] Appointment(In office/virtual)/ []  [facility]/ [] Home Care/ [] Refused Recommended Disposition /[] [facility]/ []  Follow-up with PCP Additional Notes: RN advising eval within 24 hours. No appts avail, RN advising UC. Pt is agreeable.  Reason for Disposition  Age > 60 years  Answer Assessment - Initial Assessment Questions 1. LOCATION: "Where does it hurt?"      Left Lower abdomen pain  2. RADIATION: "Does the pain shoot anywhere else?" (e.g., chest, back)     No,  3. ONSET: "When did the pain begin?" (e.g., minutes, hours or days ago)      Started 2 days ago  4. SUDDEN: "Gradual or sudden onset?"     Gradually getting worse  5. PATTERN "Does the pain come and go, or is it constant?"    - If it comes and goes: "How long does it last?" "Do you have pain now?"     (Note: Comes and goes means the pain is intermittent. It goes away completely between bouts.)    - If constant: "Is it getting better, staying the same, or getting worse?"      (Note: Constant means the pain never goes away completely; most serious pain is constant and gets worse.)      Constant  6. SEVERITY: "How bad is the pain?"  (e.g., Scale 1-10; mild, moderate, or severe)    - MILD (1-3): Doesn't interfere with normal activities, abdomen soft and not tender to touch.     - MODERATE (4-7): Interferes with normal activities or awakens from sleep, abdomen tender to touch.     - SEVERE (8-10): Excruciating pain, doubled over, unable to do any normal activities.       8/10 pain  7.  RECURRENT SYMPTOM: "Have you ever had this type of stomach pain before?" If Yes, ask: "When was the last time?" and "What happened that time?"      Had similar pain and was diagnosed with UTI  8. CAUSE: "What do you think is causing the stomach pain?"     Unsure of cause  9. RELIEVING/AGGRAVATING FACTORS: "What makes it better or worse?" (e.g., antacids, bending or twisting motion, bowel movement)     Tylenol  has been helping  10. OTHER SYMPTOMS: "Do you have any other symptoms?" (e.g., back pain, diarrhea, fever, urination pain, vomiting)       Vomiting yesterday  11. PREGNANCY: "Is there any chance you are pregnant?" "When was your last menstrual period?"       No  Protocols used: Abdominal Pain - [facility]

## 2023-12-31 DIAGNOSIS — J3089 Other allergic rhinitis: Secondary | ICD-10-CM | POA: Diagnosis not present

## 2023-12-31 DIAGNOSIS — L2089 Other atopic dermatitis: Secondary | ICD-10-CM | POA: Diagnosis not present

## 2023-12-31 DIAGNOSIS — J4530 Mild persistent asthma, uncomplicated: Secondary | ICD-10-CM | POA: Diagnosis not present

## 2023-12-31 DIAGNOSIS — L501 Idiopathic urticaria: Secondary | ICD-10-CM | POA: Diagnosis not present

## 2024-01-06 ENCOUNTER — Other Ambulatory Visit: Payer: Self-pay | Admitting: Family Medicine

## 2024-01-20 ENCOUNTER — Other Ambulatory Visit: Payer: Self-pay | Admitting: Family Medicine

## 2024-01-20 DIAGNOSIS — M542 Cervicalgia: Secondary | ICD-10-CM

## 2024-01-20 DIAGNOSIS — G43019 Migraine without aura, intractable, without status migrainosus: Secondary | ICD-10-CM

## 2024-01-20 DIAGNOSIS — G500 Trigeminal neuralgia: Secondary | ICD-10-CM

## 2024-01-31 ENCOUNTER — Other Ambulatory Visit: Payer: Self-pay | Admitting: Family Medicine

## 2024-01-31 DIAGNOSIS — G500 Trigeminal neuralgia: Secondary | ICD-10-CM

## 2024-01-31 DIAGNOSIS — M542 Cervicalgia: Secondary | ICD-10-CM

## 2024-01-31 DIAGNOSIS — G43019 Migraine without aura, intractable, without status migrainosus: Secondary | ICD-10-CM

## 2024-02-03 DIAGNOSIS — I152 Hypertension secondary to endocrine disorders: Secondary | ICD-10-CM | POA: Diagnosis not present

## 2024-02-03 DIAGNOSIS — E1165 Type 2 diabetes mellitus with hyperglycemia: Secondary | ICD-10-CM | POA: Diagnosis not present

## 2024-02-03 DIAGNOSIS — E1159 Type 2 diabetes mellitus with other circulatory complications: Secondary | ICD-10-CM | POA: Diagnosis not present

## 2024-02-03 DIAGNOSIS — E1169 Type 2 diabetes mellitus with other specified complication: Secondary | ICD-10-CM | POA: Diagnosis not present

## 2024-02-03 DIAGNOSIS — E785 Hyperlipidemia, unspecified: Secondary | ICD-10-CM | POA: Diagnosis not present

## 2024-03-24 ENCOUNTER — Other Ambulatory Visit: Payer: Self-pay | Admitting: Family Medicine

## 2024-03-24 DIAGNOSIS — R413 Other amnesia: Secondary | ICD-10-CM

## 2024-03-24 NOTE — Telephone Encounter (Signed)
 Copied from CRM [id number]. Topic: Clinical - Medication Refill >> Mar 24, 2024  8:37 AM Charlet HERO wrote: Medication: donepezil  (ARICEPT ) 10 MG tablet  Has the patient contacted their pharmacy? Yes Wants to change pharmacy  This is the patient's preferred pharmacy:  [facility] [address] [place], [place] [address] Phone:[phone] FAX:  Is this the correct pharmacy for this prescription? Yes If no, delete pharmacy and type the correct one.   Has the prescription been filled recently? Yes  Is the patient out of the medication? Yes  Has the patient been seen for an appointment in the last year OR does the patient have an upcoming appointment? Yes  Can we respond through MyChart? No  Agent: Please be advised that Rx refills may take up to 3 business days. We ask that you follow-up with your pharmacy.

## 2024-03-26 MED ORDER — DONEPEZIL HCL 10 MG PO TABS: 10.0000 mg | ORAL_TABLET | Freq: Every day | ORAL | 0 refills | Status: DC

## 2024-03-26 NOTE — Telephone Encounter (Signed)
 OFFICE VISIT NEEDED FOR ADDITIONAL REFILLS   Requested Prescriptions  Pending Prescriptions Disp Refills   donepezil  (ARICEPT ) 10 MG tablet 30 tablet 0    Sig: Take 1 tablet (10 mg total) by mouth at bedtime.     Neurology:  Alzheimer's Agents Failed - 03/26/2024 11:05 AM      Failed - Valid encounter within last 6 months    Recent Outpatient Visits   None

## 2024-04-03 ENCOUNTER — Other Ambulatory Visit: Payer: Self-pay | Admitting: Family Medicine

## 2024-04-03 DIAGNOSIS — Z1231 Encounter for screening mammogram for malignant neoplasm of breast: Secondary | ICD-10-CM

## 2024-04-10 ENCOUNTER — Other Ambulatory Visit: Payer: Self-pay | Admitting: Family Medicine

## 2024-05-08 ENCOUNTER — Other Ambulatory Visit: Payer: Self-pay | Admitting: Family Medicine

## 2024-05-08 DIAGNOSIS — G43019 Migraine without aura, intractable, without status migrainosus: Secondary | ICD-10-CM

## 2024-05-08 DIAGNOSIS — R413 Other amnesia: Secondary | ICD-10-CM

## 2024-05-08 DIAGNOSIS — G500 Trigeminal neuralgia: Secondary | ICD-10-CM

## 2024-05-08 DIAGNOSIS — E1169 Type 2 diabetes mellitus with other specified complication: Secondary | ICD-10-CM

## 2024-05-08 DIAGNOSIS — M542 Cervicalgia: Secondary | ICD-10-CM

## 2024-05-08 DIAGNOSIS — M62830 Muscle spasm of back: Secondary | ICD-10-CM

## 2024-05-08 DIAGNOSIS — N951 Menopausal and female climacteric states: Secondary | ICD-10-CM

## 2024-05-08 NOTE — Telephone Encounter (Unsigned)
 Copied from CRM [id number]. Topic: Clinical - Medication Refill >> May 08, 2024  5:28 PM Shanda MATSU wrote: Medication: levocetirizine (XYZAL) 5 MG tablet, gabapentin  (NEURONTIN ) 800 MG tablet  Has the patient contacted their pharmacy? Yes (Agent: If no, request that the patient contact the pharmacy for the refill. If patient does not wish to contact the pharmacy document the reason why and proceed with request.) (Agent: If yes, when and what did the pharmacy advise?)  This is the patient's preferred pharmacy:  [facility] phone #[phone] [facility] [phone] [address] [place], [place], [place] [address]  Is this the correct pharmacy for this prescription? Yes If no, delete pharmacy and type the correct one.   Has the prescription been filled recently? No  Is the patient out of the medication? Yes  Has the patient been seen for an appointment in the last year OR does the patient have an upcoming appointment? Yes  Can we respond through MyChart? Yes  Agent: Please be advised that Rx refills may take up to 3 business days. We ask that you follow-up with your pharmacy.

## 2024-05-08 NOTE — Telephone Encounter (Signed)
 Copied from CRM [id number]. Topic: Clinical - Prescription Issue >> May 08, 2024  5:32 PM Shanda MATSU wrote: Reason for CRM: Patient is req that all meds be sent to [facility] pharmacy now,  [facility] phone #[phone] [facility] [phone] [address] [place], [place], [place] [address]

## 2024-05-11 MED ORDER — FUROSEMIDE 20 MG PO TABS: 20.0000 mg | ORAL_TABLET | Freq: Every day | ORAL | 3 refills | Status: AC | PRN

## 2024-05-11 MED ORDER — GABAPENTIN 800 MG PO TABS: 800.0000 mg | ORAL_TABLET | Freq: Three times a day (TID) | ORAL | 2 refills | Status: DC

## 2024-05-11 MED ORDER — DONEPEZIL HCL 10 MG PO TABS: 10.0000 mg | ORAL_TABLET | Freq: Every day | ORAL | 2 refills | Status: AC

## 2024-05-11 MED ORDER — ESTRADIOL 0.5 MG PO TABS
0.5000 mg | ORAL_TABLET | Freq: Every day | ORAL | 1 refills | Status: AC
Start: 2024-05-11 — End: ?

## 2024-05-11 MED ORDER — ROSUVASTATIN CALCIUM 10 MG PO TABS: 10.0000 mg | ORAL_TABLET | Freq: Every day | ORAL | 0 refills | Status: AC

## 2024-05-11 MED ORDER — GABAPENTIN 800 MG PO TABS: 800.0000 mg | ORAL_TABLET | Freq: Three times a day (TID) | ORAL | 5 refills | Status: AC

## 2024-05-11 MED ORDER — SPIRONOLACTONE 100 MG PO TABS: 100.0000 mg | ORAL_TABLET | Freq: Every day | ORAL | 4 refills | Status: AC

## 2024-05-11 NOTE — Telephone Encounter (Signed)
 Requested Prescriptions  Pending Prescriptions Disp Refills   ADVAIR HFA 115-21 MCG/ACT inhaler 1 each     Sig: Inhale 2 puffs into the lungs 2 (two) times daily.     Pulmonology:  Combination Products Failed - 05/11/2024 10:35 AM      Failed - Valid encounter within last 12 months    Recent Outpatient Visits   None             Cholecalciferol 50 MCG (2000 UT) CAPS 30 capsule     Sig: Take by mouth daily.     Endocrinology:  Vitamins - Vitamin D  Supplementation 2 Failed - 05/11/2024 10:35 AM      Failed - Manual Review: Route requests for 50,000 IU strength to the provider      Failed - Vitamin D  in normal range and within 360 days    Vit D, 25-Hydroxy  Date Value Ref Range Status  02/23/2023 73.1 30.0 - 100.0 ng/mL Final    Comment:    Vitamin D  deficiency has been defined by the Institute of Medicine and an Endocrine Society practice guideline as a level of serum 25-OH vitamin D  less than 20 ng/mL (1,2). The Endocrine Society went on to further define vitamin D  insufficiency as a level between 21 and 29 ng/mL (2). 1. IOM (Institute of Medicine). 2010. Dietary reference    intakes for calcium  and D. [place]: The    [employer]. 2. Holick MF, Binkley Orick, Bischoff-Ferrari HA, et al.    Evaluation, treatment, and prevention of vitamin D     deficiency: an Endocrine Society clinical practice    guideline. JCEM. 2011 Jul; 96(7):1911-30.          Failed - Valid encounter within last 12 months    Recent Outpatient Visits   None            Passed - Ca in normal range and within 360 days    Calcium   Date Value Ref Range Status  08/27/2023 9.4 8.7 - 10.3 mg/dL Final   Calcium , Total  Date Value Ref Range Status  02/12/2014 8.0 (L) 8.5 - 10.1 mg/dL Final          Continuous Glucose Sensor (FREESTYLE LIBRE 3 PLUS SENSOR) MISC      Sig: Change sensor every 15 days.     There is no refill protocol information for this order     diclofenac Sodium  (VOLTAREN) 1 % GEL      Sig: Apply 2 g topically 4 (four) times daily.     Analgesics:  Topicals Failed - 05/11/2024 10:35 AM      Failed - Manual Review: Labs are only required if the patient has taken medication for more than 8 weeks.      Failed - PLT in normal range and within 360 days    Platelets  Date Value Ref Range Status  02/23/2023 280 150 - 450 x10E3/uL Final         Failed - HGB in normal range and within 360 days    Hemoglobin  Date Value Ref Range Status  02/23/2023 12.6 11.1 - 15.9 g/dL Final         Failed - HCT in normal range and within 360 days    Hematocrit  Date Value Ref Range Status  02/23/2023 38.7 34.0 - 46.6 % Final         Failed - Cr in normal range and within 360 days    Creatinine  Date  Value Ref Range Status  02/12/2014 1.04 0.60 - 1.30 mg/dL Final   Creatinine, Ser  Date Value Ref Range Status  08/27/2023 1.16 (H) 0.57 - 1.00 mg/dL Final   Creatinine, POC  Date Value Ref Range Status  05/15/2019 n/a mg/dL Final         Failed - Valid encounter within last 12 months    Recent Outpatient Visits   None            Passed - eGFR is 30 or above and within 360 days    EGFR (African American)  Date Value Ref Range Status  02/12/2014 >60  Final   GFR calc Af Amer  Date Value Ref Range Status  08/17/2019 >60 >60 mL/min Final   EGFR (Non-African Amer.)  Date Value Ref Range Status  02/12/2014 56 (L)  Final    Comment:    eGFR values <70mL/min/1.73 m2 may be an indication of chronic kidney disease (CKD). Calculated eGFR is useful in patients with stable renal function. The eGFR calculation will not be reliable in acutely ill patients when serum creatinine is changing rapidly. It is not useful in  patients on dialysis. The eGFR calculation may not be applicable to patients at the low and high extremes of body sizes, pregnant women, and vegetarians. LABS - This specimen was collected through an   - indwelling catheter or arterial  line.  - A minimum of of blood was wasted prior    - to collecting the sample.  Interpret  - results with caution.    GFR calc non Af Amer  Date Value Ref Range Status  08/17/2019 >60 >60 mL/min Final   eGFR  Date Value Ref Range Status  08/27/2023 49 (L) >59 mL/min/1.73 Final         Passed - Patient is not pregnant       donepezil  (ARICEPT ) 10 MG tablet 30 tablet 0    Sig: Take 1 tablet (10 mg total) by mouth at bedtime.     Neurology:  Alzheimer's Agents Failed - 05/11/2024 10:35 AM      Failed - Valid encounter within last 6 months    Recent Outpatient Visits   None             estradiol  (ESTRACE ) 0.5 MG tablet 45 tablet 3     OB/GYN:  Estrogens Failed - 05/11/2024 10:35 AM      Failed - Mammogram is up-to-date per Health Maintenance      Failed - Valid encounter within last 12 months    Recent Outpatient Visits   None            Passed - Last BP in normal range    BP Readings from Last 1 Encounters:  11/30/23 132/67          furosemide  (LASIX ) 20 MG tablet 90 tablet 1     Cardiovascular:  Diuretics - Loop Failed - 05/11/2024 10:35 AM      Failed - K in normal range and within 180 days    Potassium  Date Value Ref Range Status  08/27/2023 5.0 3.5 - 5.2 mmol/L Final  02/12/2014 4.4 3.5 - 5.1 mmol/L Final         Failed - Ca in normal range and within 180 days    Calcium   Date Value Ref Range Status  08/27/2023 9.4 8.7 - 10.3 mg/dL Final   Calcium , Total  Date Value Ref Range Status  02/12/2014 8.0 (L) 8.5 -  10.1 mg/dL Final         Failed - Na in normal range and within 180 days    Sodium  Date Value Ref Range Status  08/27/2023 141 134 - 144 mmol/L Final  02/12/2014 142 136 - 145 mmol/L Final         Failed - Cr in normal range and within 180 days    Creatinine  Date Value Ref Range Status  02/12/2014 1.04 0.60 - 1.30 mg/dL Final   Creatinine, Ser  Date Value Ref Range Status  08/27/2023 1.16 (H) 0.57 - 1.00 mg/dL Final    Creatinine, POC  Date Value Ref Range Status  05/15/2019 n/a mg/dL Final         Failed - Cl in normal range and within 180 days    Chloride  Date Value Ref Range Status  08/27/2023 104 96 - 106 mmol/L Final  02/12/2014 112 (H) 98 - 107 mmol/L Final         Failed - Mg Level in normal range and within 180 days    Magnesium  Date Value Ref Range Status  08/12/2022 2.1 1.6 - 2.3 mg/dL Final  92/80/7984 2.2 mg/dL Final    Comment:    8.1-7.5 THERAPEUTIC RANGE: 4-7 mg/dL TOXIC: > 10 mg/dL  -----------------------          Failed - Valid encounter within last 6 months    Recent Outpatient Visits   None            Passed - Last BP in normal range    BP Readings from Last 1 Encounters:  11/30/23 132/67          gabapentin  (NEURONTIN ) 800 MG tablet 90 tablet 3     Neurology: Anticonvulsants - gabapentin  Failed - 05/11/2024 10:35 AM      Failed - Cr in normal range and within 360 days    Creatinine  Date Value Ref Range Status  02/12/2014 1.04 0.60 - 1.30 mg/dL Final   Creatinine, Ser  Date Value Ref Range Status  08/27/2023 1.16 (H) 0.57 - 1.00 mg/dL Final   Creatinine, POC  Date Value Ref Range Status  05/15/2019 n/a mg/dL Final         Failed - Valid encounter within last 12 months    Recent Outpatient Visits   None            Passed - Completed PHQ-2 or PHQ-9 in the last 360 days       glucose blood (ACCU-CHEK AVIVA PLUS) test strip 100 each 12    Sig: Use as instructed     Endocrinology: Diabetes - Testing Supplies Failed - 05/11/2024 10:35 AM      Failed - Valid encounter within last 12 months    Recent Outpatient Visits   None             levocetirizine (XYZAL) 5 MG tablet      Sig: Take 1 tablet (5 mg total) by mouth every evening.     Ear, Nose, and Throat:  Antihistamines - levocetirizine dihydrochloride Failed - 05/11/2024 10:35 AM      Failed - Cr in normal range and within 360 days    Creatinine  Date Value Ref Range  Status  02/12/2014 1.04 0.60 - 1.30 mg/dL Final   Creatinine, Ser  Date Value Ref Range Status  08/27/2023 1.16 (H) 0.57 - 1.00 mg/dL Final   Creatinine, POC  Date Value Ref Range Status  05/15/2019 n/a mg/dL  Final         Failed - Valid encounter within last 12 months    Recent Outpatient Visits   None            Passed - eGFR is 10 or above and within 360 days    EGFR (African American)  Date Value Ref Range Status  02/12/2014 >60  Final   GFR calc Af Amer  Date Value Ref Range Status  08/17/2019 >60 >60 mL/min Final   EGFR (Non-African Amer.)  Date Value Ref Range Status  02/12/2014 56 (L)  Final    Comment:    eGFR values <59mL/min/1.73 m2 may be an indication of chronic kidney disease (CKD). Calculated eGFR is useful in patients with stable renal function. The eGFR calculation will not be reliable in acutely ill patients when serum creatinine is changing rapidly. It is not useful in  patients on dialysis. The eGFR calculation may not be applicable to patients at the low and high extremes of body sizes, pregnant women, and vegetarians. LABS - This specimen was collected through an   - indwelling catheter or arterial line.  - A minimum of of blood was wasted prior    - to collecting the sample.  Interpret  - results with caution.    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 REMOVE AND DISCARD AFTER 12 HOURS OR AS DIRECTED BY DOCTOR     Analgesics:  Topicals Failed - 05/11/2024 10:35 AM      Failed - Manual Review: Labs are only required if the patient has taken medication for more than 8 weeks.      Failed - PLT in normal range and within 360 days    Platelets  Date Value Ref Range Status  02/23/2023 280 150 -  450 x10E3/uL Final         Failed - HGB in normal range and within 360 days    Hemoglobin  Date Value Ref Range Status  02/23/2023 12.6 11.1 - 15.9 g/dL Final         Failed - HCT in normal range and within 360 days    Hematocrit  Date Value Ref Range Status  02/23/2023 38.7 34.0 - 46.6 % Final         Failed - Cr in normal range and within 360 days    Creatinine  Date Value Ref Range Status  02/12/2014 1.04 0.60 - 1.30 mg/dL Final   Creatinine, Ser  Date Value Ref Range Status  08/27/2023 1.16 (H) 0.57 - 1.00 mg/dL Final   Creatinine, POC  Date Value Ref Range Status  05/15/2019 n/a mg/dL Final         Failed - Valid encounter within last 12 months    Recent Outpatient Visits   None            Passed - eGFR is 30 or above and within 360 days    EGFR (African American)  Date Value Ref Range Status  02/12/2014 >60  Final   GFR calc Af Amer  Date Value Ref Range Status  08/17/2019 >60 >60 mL/min Final   EGFR (Non-African Amer.)  Date Value  Ref Range Status  02/12/2014 56 (L)  Final    Comment:    eGFR values <25mL/min/1.73 m2 may be an indication of chronic kidney disease (CKD). Calculated eGFR is useful in patients with stable renal function. The eGFR calculation will not be reliable in acutely ill patients when serum creatinine is changing rapidly. It is not useful in  patients on dialysis. The eGFR calculation may not be applicable to patients at the low and high extremes of body sizes, pregnant women, and vegetarians. LABS - This specimen was collected through an   - indwelling catheter or arterial line.  - A minimum of of blood was wasted prior    - to collecting the sample.  Interpret  - results with caution.    GFR calc non Af Amer  Date Value Ref Range Status  08/17/2019 >60 >60 mL/min Final   eGFR  Date Value Ref Range Status  08/27/2023 49 (L) >59 mL/min/1.73 Final         Passed - Patient is not pregnant       naproxen   (NAPROSYN ) 250 MG tablet 14 tablet 0    Sig: Take 1 tablet (250 mg total) by mouth 2 (two) times daily with a meal.     Analgesics:  NSAIDS Failed - 05/11/2024 10:35 AM      Failed - Manual Review: Labs are only required if the patient has taken medication for more than 8 weeks.      Failed - Cr in normal range and within 360 days    Creatinine  Date Value Ref Range Status  02/12/2014 1.04 0.60 - 1.30 mg/dL Final   Creatinine, Ser  Date Value Ref Range Status  08/27/2023 1.16 (H) 0.57 - 1.00 mg/dL Final   Creatinine, POC  Date Value Ref Range Status  05/15/2019 n/a mg/dL Final         Failed - HGB in normal range and within 360 days    Hemoglobin  Date Value Ref Range Status  02/23/2023 12.6 11.1 - 15.9 g/dL Final         Failed - PLT in normal range and within 360 days    Platelets  Date Value Ref Range Status  02/23/2023 280 150 - 450 x10E3/uL Final         Failed - HCT in normal range and within 360 days    Hematocrit  Date Value Ref Range Status  02/23/2023 38.7 34.0 - 46.6 % Final         Failed - Valid encounter within last 12 months    Recent Outpatient Visits   None            Passed - eGFR is 30 or above and within 360 days    EGFR (African American)  Date Value Ref Range Status  02/12/2014 >60  Final   GFR calc Af Amer  Date Value Ref Range Status  08/17/2019 >60 >60 mL/min Final   EGFR (Non-African Amer.)  Date Value Ref Range Status  02/12/2014 56 (L)  Final    Comment:    eGFR values <65mL/min/1.73 m2 may be an indication of chronic kidney disease (CKD). Calculated eGFR is useful in patients with stable renal function. The eGFR calculation will not be reliable in acutely ill patients when serum creatinine is changing rapidly. It is not useful in  patients on dialysis. The eGFR calculation may not be applicable to patients at the low and high extremes of body sizes, pregnant women, and  vegetarians. LABS - This specimen was collected  through an   - indwelling catheter or arterial line.  - A minimum of of blood was wasted prior    - to collecting the sample.  Interpret  - results with caution.    GFR calc non Af Amer  Date Value Ref Range Status  08/17/2019 >60 >60 mL/min Final   eGFR  Date Value Ref Range Status  08/27/2023 49 (L) >59 mL/min/1.73 Final         Passed - Patient is not pregnant       OZEMPIC , 2 MG/DOSE, 8 MG/3ML SOPN      Sig: Inject 2 mg into the skin once a week.     Endocrinology:  Diabetes - GLP-1 Receptor Agonists - semaglutide  Failed - 05/11/2024 10:35 AM      Failed - HBA1C in normal range and within 180 days    Hemoglobin A1C  Date Value Ref Range Status  08/03/2023 6.7  Final    Comment:    Dr. Cherilyn         Failed - Cr in normal range and within 360 days    Creatinine  Date Value Ref Range Status  02/12/2014 1.04 0.60 - 1.30 mg/dL Final   Creatinine, Ser  Date Value Ref Range Status  08/27/2023 1.16 (H) 0.57 - 1.00 mg/dL Final   Creatinine, POC  Date Value Ref Range Status  05/15/2019 n/a mg/dL Final         Failed - Valid encounter within last 6 months    Recent Outpatient Visits   None             rosuvastatin  (CRESTOR ) 10 MG tablet 90 tablet 0    Sig: Take 1 tablet (10 mg total) by mouth daily.     Cardiovascular:  Antilipid - Statins 2 Failed - 05/11/2024 10:35 AM      Failed - Cr in normal range and within 360 days    Creatinine  Date Value Ref Range Status  02/12/2014 1.04 0.60 - 1.30 mg/dL Final   Creatinine, Ser  Date Value Ref Range Status  08/27/2023 1.16 (H) 0.57 - 1.00 mg/dL Final   Creatinine, POC  Date Value Ref Range Status  05/15/2019 n/a mg/dL Final         Failed - Valid encounter within last 12 months    Recent Outpatient Visits   None            Failed - Lipid Panel in normal range within the last 12 months    Cholesterol, Total  Date Value Ref Range Status  08/27/2023 231 (H) 100 - 199 mg/dL Final   LDL  Chol Calc (NIH)  Date Value Ref Range Status  08/27/2023 151 (H) 0 - 99 mg/dL Final   HDL  Date Value Ref Range Status  08/27/2023 42 >39 mg/dL Final   Triglycerides  Date Value Ref Range Status  08/27/2023 207 (H) 0 - 149 mg/dL Final         Passed - Patient is not pregnant       spironolactone (ALDACTONE) 100 MG tablet 90 tablet 4     Cardiovascular: Diuretics - Aldosterone Antagonist Failed - 05/11/2024 10:35 AM      Failed - Cr in normal range and within 180 days    Creatinine  Date Value Ref Range Status  02/12/2014 1.04 0.60 - 1.30 mg/dL Final   Creatinine, Ser  Date Value Ref Range Status  08/27/2023 1.16 (  H) 0.57 - 1.00 mg/dL Final   Creatinine, POC  Date Value Ref Range Status  05/15/2019 n/a mg/dL Final         Failed - K in normal range and within 180 days    Potassium  Date Value Ref Range Status  08/27/2023 5.0 3.5 - 5.2 mmol/L Final  02/12/2014 4.4 3.5 - 5.1 mmol/L Final         Failed - Na in normal range and within 180 days    Sodium  Date Value Ref Range Status  08/27/2023 141 134 - 144 mmol/L Final  02/12/2014 142 136 - 145 mmol/L Final         Failed - eGFR is 30 or above and within 180 days    EGFR (African American)  Date Value Ref Range Status  02/12/2014 >60  Final   GFR calc Af Amer  Date Value Ref Range Status  08/17/2019 >60 >60 mL/min Final   EGFR (Non-African Amer.)  Date Value Ref Range Status  02/12/2014 56 (L)  Final    Comment:    eGFR values <75mL/min/1.73 m2 may be an indication of chronic kidney disease (CKD). Calculated eGFR is useful in patients with stable renal function. The eGFR calculation will not be reliable in acutely ill patients when serum creatinine is changing rapidly. It is not useful in  patients on dialysis. The eGFR calculation may not be applicable to patients at the low and high extremes of body sizes, pregnant women, and vegetarians. LABS - This specimen was collected through an   -  indwelling catheter or arterial line.  - A minimum of of blood was wasted prior    - to collecting the sample.  Interpret  - results with caution.    GFR calc non Af Amer  Date Value Ref Range Status  08/17/2019 >60 >60 mL/min Final   eGFR  Date Value Ref Range Status  08/27/2023 49 (L) >59 mL/min/1.73 Final         Failed - Valid encounter within last 6 months    Recent Outpatient Visits   None            Passed - Last BP in normal range    BP Readings from Last 1 Encounters:  11/30/23 132/67

## 2024-05-11 NOTE — Telephone Encounter (Signed)
 Requested medications are due for refill today.  unsure  Requested medications are on the active medications list.  yes  Last refill. 07/07/2010  Future visit scheduled.   yes  Notes to clinic.  Medication is historical.    Requested Prescriptions  Pending Prescriptions Disp Refills   levocetirizine (XYZAL) 5 MG tablet      Sig: Take 1 tablet (5 mg total) by mouth every evening.     Ear, Nose, and Throat:  Antihistamines - levocetirizine dihydrochloride Failed - 05/11/2024 10:05 AM      Failed - Cr in normal range and within 360 days    Creatinine  Date Value Ref Range Status  02/12/2014 1.04 0.60 - 1.30 mg/dL Final   Creatinine, Ser  Date Value Ref Range Status  08/27/2023 1.16 (H) 0.57 - 1.00 mg/dL Final   Creatinine, POC  Date Value Ref Range Status  05/15/2019 n/a mg/dL Final         Failed - Valid encounter within last 12 months    Recent Outpatient Visits   None            Passed - eGFR is 10 or above and within 360 days    EGFR (African American)  Date Value Ref Range Status  02/12/2014 >60  Final   GFR calc Af Amer  Date Value Ref Range Status  08/17/2019 >60 >60 mL/min Final   EGFR (Non-African Amer.)  Date Value Ref Range Status  02/12/2014 56 (L)  Final    Comment:    eGFR values <19mL/min/1.73 m2 may be an indication of chronic kidney disease (CKD). Calculated eGFR is useful in patients with stable renal function. The eGFR calculation will not be reliable in acutely ill patients when serum creatinine is changing rapidly. It is not useful in  patients on dialysis. The eGFR calculation may not be applicable to patients at the low and high extremes of body sizes, pregnant women, and vegetarians. LABS - This specimen was collected through an   - indwelling catheter or arterial line.  - A minimum of of blood was wasted prior    - to collecting the sample.  Interpret  - results with caution.    GFR calc non Af Amer  Date Value Ref Range  Status  08/17/2019 >60 >60 mL/min Final   eGFR  Date Value Ref Range Status  08/27/2023 49 (L) >59 mL/min/1.73 Final         Signed Prescriptions Disp Refills   gabapentin  (NEURONTIN ) 800 MG tablet 90 tablet 2    Sig: Take 1 tablet (800 mg total) by mouth 3 (three) times daily.     Neurology: Anticonvulsants - gabapentin  Failed - 05/11/2024 10:05 AM      Failed - Cr in normal range and within 360 days    Creatinine  Date Value Ref Range Status  02/12/2014 1.04 0.60 - 1.30 mg/dL Final   Creatinine, Ser  Date Value Ref Range Status  08/27/2023 1.16 (H) 0.57 - 1.00 mg/dL Final   Creatinine, POC  Date Value Ref Range Status  05/15/2019 n/a mg/dL Final         Failed - Valid encounter within last 12 months    Recent Outpatient Visits   None            Passed - Completed PHQ-2 or PHQ-9 in the last 360 days

## 2024-05-11 NOTE — Telephone Encounter (Signed)
 Requested medications are due for refill today.  unsure  Requested medications are on the active medications list.  yes  Last refill. Varied.  Future visit scheduled.   yes  Notes to clinic.  Many of these meds are historical. Also expired labs.  Please review for refill    Requested Prescriptions  Pending Prescriptions Disp Refills   ADVAIR HFA 115-21 MCG/ACT inhaler 1 each     Sig: Inhale 2 puffs into the lungs 2 (two) times daily.     Pulmonology:  Combination Products Failed - 05/11/2024 10:36 AM      Failed - Valid encounter within last 12 months    Recent Outpatient Visits   None             Cholecalciferol 50 MCG (2000 UT) CAPS 30 capsule     Sig: Take by mouth daily.     Endocrinology:  Vitamins - Vitamin D  Supplementation 2 Failed - 05/11/2024 10:36 AM      Failed - Manual Review: Route requests for 50,000 IU strength to the provider      Failed - Vitamin D  in normal range and within 360 days    Vit D, 25-Hydroxy  Date Value Ref Range Status  02/23/2023 73.1 30.0 - 100.0 ng/mL Final    Comment:    Vitamin D  deficiency has been defined by the Institute of Medicine and an Endocrine Society practice guideline as a level of serum 25-OH vitamin D  less than 20 ng/mL (1,2). The Endocrine Society went on to further define vitamin D  insufficiency as a level between 21 and 29 ng/mL (2). 1. IOM (Institute of Medicine). 2010. Dietary reference    intakes for calcium  and D. [place]: The    [employer]. 2. Holick MF, Binkley Gypsy, Bischoff-Ferrari HA, et al.    Evaluation, treatment, and prevention of vitamin D     deficiency: an Endocrine Society clinical practice    guideline. JCEM. 2011 Jul; 96(7):1911-30.          Failed - Valid encounter within last 12 months    Recent Outpatient Visits   None            Passed - Ca in normal range and within 360 days    Calcium   Date Value Ref Range Status  08/27/2023 9.4 8.7 - 10.3 mg/dL Final    Calcium , Total  Date Value Ref Range Status  02/12/2014 8.0 (L) 8.5 - 10.1 mg/dL Final          Continuous Glucose Sensor (FREESTYLE LIBRE 3 PLUS SENSOR) MISC      Sig: Change sensor every 15 days.     There is no refill protocol information for this order     diclofenac Sodium (VOLTAREN) 1 % GEL      Sig: Apply 2 g topically 4 (four) times daily.     Analgesics:  Topicals Failed - 05/11/2024 10:36 AM      Failed - Manual Review: Labs are only required if the patient has taken medication for more than 8 weeks.      Failed - PLT in normal range and within 360 days    Platelets  Date Value Ref Range Status  02/23/2023 280 150 - 450 x10E3/uL Final         Failed - HGB in normal range and within 360 days    Hemoglobin  Date Value Ref Range Status  02/23/2023 12.6 11.1 - 15.9 g/dL Final  Failed - HCT in normal range and within 360 days    Hematocrit  Date Value Ref Range Status  02/23/2023 38.7 34.0 - 46.6 % Final         Failed - Cr in normal range and within 360 days    Creatinine  Date Value Ref Range Status  02/12/2014 1.04 0.60 - 1.30 mg/dL Final   Creatinine, Ser  Date Value Ref Range Status  08/27/2023 1.16 (H) 0.57 - 1.00 mg/dL Final   Creatinine, POC  Date Value Ref Range Status  05/15/2019 n/a mg/dL Final         Failed - Valid encounter within last 12 months    Recent Outpatient Visits   None            Passed - eGFR is 30 or above and within 360 days    EGFR (African American)  Date Value Ref Range Status  02/12/2014 >60  Final   GFR calc Af Amer  Date Value Ref Range Status  08/17/2019 >60 >60 mL/min Final   EGFR (Non-African Amer.)  Date Value Ref Range Status  02/12/2014 56 (L)  Final    Comment:    eGFR values <44mL/min/1.73 m2 may be an indication of chronic kidney disease (CKD). Calculated eGFR is useful in patients with stable renal function. The eGFR calculation will not be reliable in acutely ill patients when serum  creatinine is changing rapidly. It is not useful in  patients on dialysis. The eGFR calculation may not be applicable to patients at the low and high extremes of body sizes, pregnant women, and vegetarians. LABS - This specimen was collected through an   - indwelling catheter or arterial line.  - A minimum of of blood was wasted prior    - to collecting the sample.  Interpret  - results with caution.    GFR calc non Af Amer  Date Value Ref Range Status  08/17/2019 >60 >60 mL/min Final   eGFR  Date Value Ref Range Status  08/27/2023 49 (L) >59 mL/min/1.73 Final         Passed - Patient is not pregnant       donepezil  (ARICEPT ) 10 MG tablet 30 tablet 0    Sig: Take 1 tablet (10 mg total) by mouth at bedtime.     Neurology:  Alzheimer's Agents Failed - 05/11/2024 10:36 AM      Failed - Valid encounter within last 6 months    Recent Outpatient Visits   None             estradiol  (ESTRACE ) 0.5 MG tablet 45 tablet 3     OB/GYN:  Estrogens Failed - 05/11/2024 10:36 AM      Failed - Mammogram is up-to-date per Health Maintenance      Failed - Valid encounter within last 12 months    Recent Outpatient Visits   None            Passed - Last BP in normal range    BP Readings from Last 1 Encounters:  11/30/23 132/67          furosemide  (LASIX ) 20 MG tablet 90 tablet 1     Cardiovascular:  Diuretics - Loop Failed - 05/11/2024 10:36 AM      Failed - K in normal range and within 180 days    Potassium  Date Value Ref Range Status  08/27/2023 5.0 3.5 - 5.2 mmol/L Final  02/12/2014 4.4 3.5 - 5.1 mmol/L Final  Failed - Ca in normal range and within 180 days    Calcium   Date Value Ref Range Status  08/27/2023 9.4 8.7 - 10.3 mg/dL Final   Calcium , Total  Date Value Ref Range Status  02/12/2014 8.0 (L) 8.5 - 10.1 mg/dL Final         Failed - Na in normal range and within 180 days    Sodium  Date Value Ref Range Status  08/27/2023 141 134 - 144  mmol/L Final  02/12/2014 142 136 - 145 mmol/L Final         Failed - Cr in normal range and within 180 days    Creatinine  Date Value Ref Range Status  02/12/2014 1.04 0.60 - 1.30 mg/dL Final   Creatinine, Ser  Date Value Ref Range Status  08/27/2023 1.16 (H) 0.57 - 1.00 mg/dL Final   Creatinine, POC  Date Value Ref Range Status  05/15/2019 n/a mg/dL Final         Failed - Cl in normal range and within 180 days    Chloride  Date Value Ref Range Status  08/27/2023 104 96 - 106 mmol/L Final  02/12/2014 112 (H) 98 - 107 mmol/L Final         Failed - Mg Level in normal range and within 180 days    Magnesium  Date Value Ref Range Status  08/12/2022 2.1 1.6 - 2.3 mg/dL Final  92/80/7984 2.2 mg/dL Final    Comment:    8.1-7.5 THERAPEUTIC RANGE: 4-7 mg/dL TOXIC: > 10 mg/dL  -----------------------          Failed - Valid encounter within last 6 months    Recent Outpatient Visits   None            Passed - Last BP in normal range    BP Readings from Last 1 Encounters:  11/30/23 132/67          gabapentin  (NEURONTIN ) 800 MG tablet 90 tablet 3     Neurology: Anticonvulsants - gabapentin  Failed - 05/11/2024 10:36 AM      Failed - Cr in normal range and within 360 days    Creatinine  Date Value Ref Range Status  02/12/2014 1.04 0.60 - 1.30 mg/dL Final   Creatinine, Ser  Date Value Ref Range Status  08/27/2023 1.16 (H) 0.57 - 1.00 mg/dL Final   Creatinine, POC  Date Value Ref Range Status  05/15/2019 n/a mg/dL Final         Failed - Valid encounter within last 12 months    Recent Outpatient Visits   None            Passed - Completed PHQ-2 or PHQ-9 in the last 360 days       glucose blood (ACCU-CHEK AVIVA PLUS) test strip 100 each 12    Sig: Use as instructed     Endocrinology: Diabetes - Testing Supplies Failed - 05/11/2024 10:36 AM      Failed - Valid encounter within last 12 months    Recent Outpatient Visits   None              levocetirizine (XYZAL) 5 MG tablet      Sig: Take 1 tablet (5 mg total) by mouth every evening.     Ear, Nose, and Throat:  Antihistamines - levocetirizine dihydrochloride Failed - 05/11/2024 10:36 AM      Failed - Cr in normal range and within 360 days    Creatinine  Date Value  Ref Range Status  02/12/2014 1.04 0.60 - 1.30 mg/dL Final   Creatinine, Ser  Date Value Ref Range Status  08/27/2023 1.16 (H) 0.57 - 1.00 mg/dL Final   Creatinine, POC  Date Value Ref Range Status  05/15/2019 n/a mg/dL Final         Failed - Valid encounter within last 12 months    Recent Outpatient Visits   None            Passed - eGFR is 10 or above and within 360 days    EGFR (African American)  Date Value Ref Range Status  02/12/2014 >60  Final   GFR calc Af Amer  Date Value Ref Range Status  08/17/2019 >60 >60 mL/min Final   EGFR (Non-African Amer.)  Date Value Ref Range Status  02/12/2014 56 (L)  Final    Comment:    eGFR values <1mL/min/1.73 m2 may be an indication of chronic kidney disease (CKD). Calculated eGFR is useful in patients with stable renal function. The eGFR calculation will not be reliable in acutely ill patients when serum creatinine is changing rapidly. It is not useful in  patients on dialysis. The eGFR calculation may not be applicable to patients at the low and high extremes of body sizes, pregnant women, and vegetarians. LABS - This specimen was collected through an   - indwelling catheter or arterial line.  - A minimum of of blood was wasted prior    - to collecting the sample.  Interpret  - results with caution.    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 REMOVE AND DISCARD AFTER 12 HOURS OR AS DIRECTED BY  DOCTOR     Analgesics:  Topicals Failed - 05/11/2024 10:36 AM      Failed - Manual Review: Labs are only required if the patient has taken medication for more than 8 weeks.      Failed - PLT in normal range and within 360 days    Platelets  Date Value Ref Range Status  02/23/2023 280 150 - 450 x10E3/uL Final         Failed - HGB in normal range and within 360 days    Hemoglobin  Date Value Ref Range Status  02/23/2023 12.6 11.1 - 15.9 g/dL Final         Failed - HCT in normal range and within 360 days    Hematocrit  Date Value Ref Range Status  02/23/2023 38.7 34.0 - 46.6 % Final         Failed - Cr in normal range and within 360 days    Creatinine  Date Value Ref Range Status  02/12/2014 1.04 0.60 - 1.30 mg/dL Final   Creatinine, Ser  Date Value Ref Range Status  08/27/2023 1.16 (H) 0.57 - 1.00 mg/dL Final   Creatinine, POC  Date Value Ref Range Status  05/15/2019 n/a mg/dL Final         Failed - Valid encounter within last 12 months    Recent Outpatient Visits   None            Passed - eGFR is 30 or above and within 360  days    EGFR (African American)  Date Value Ref Range Status  02/12/2014 >60  Final   GFR calc Af Amer  Date Value Ref Range Status  08/17/2019 >60 >60 mL/min Final   EGFR (Non-African Amer.)  Date Value Ref Range Status  02/12/2014 56 (L)  Final    Comment:    eGFR values <50mL/min/1.73 m2 may be an indication of chronic kidney disease (CKD). Calculated eGFR is useful in patients with stable renal function. The eGFR calculation will not be reliable in acutely ill patients when serum creatinine is changing rapidly. It is not useful in  patients on dialysis. The eGFR calculation may not be applicable to patients at the low and high extremes of body sizes, pregnant women, and vegetarians. LABS - This specimen was collected through an   - indwelling catheter or arterial line.  - A minimum of of blood was wasted prior    - to  collecting the sample.  Interpret  - results with caution.    GFR calc non Af Amer  Date Value Ref Range Status  08/17/2019 >60 >60 mL/min Final   eGFR  Date Value Ref Range Status  08/27/2023 49 (L) >59 mL/min/1.73 Final         Passed - Patient is not pregnant       naproxen  (NAPROSYN ) 250 MG tablet 14 tablet 0    Sig: Take 1 tablet (250 mg total) by mouth 2 (two) times daily with a meal.     Analgesics:  NSAIDS Failed - 05/11/2024 10:36 AM      Failed - Manual Review: Labs are only required if the patient has taken medication for more than 8 weeks.      Failed - Cr in normal range and within 360 days    Creatinine  Date Value Ref Range Status  02/12/2014 1.04 0.60 - 1.30 mg/dL Final   Creatinine, Ser  Date Value Ref Range Status  08/27/2023 1.16 (H) 0.57 - 1.00 mg/dL Final   Creatinine, POC  Date Value Ref Range Status  05/15/2019 n/a mg/dL Final         Failed - HGB in normal range and within 360 days    Hemoglobin  Date Value Ref Range Status  02/23/2023 12.6 11.1 - 15.9 g/dL Final         Failed - PLT in normal range and within 360 days    Platelets  Date Value Ref Range Status  02/23/2023 280 150 - 450 x10E3/uL Final         Failed - HCT in normal range and within 360 days    Hematocrit  Date Value Ref Range Status  02/23/2023 38.7 34.0 - 46.6 % Final         Failed - Valid encounter within last 12 months    Recent Outpatient Visits   None            Passed - eGFR is 30 or above and within 360 days    EGFR (African American)  Date Value Ref Range Status  02/12/2014 >60  Final   GFR calc Af Amer  Date Value Ref Range Status  08/17/2019 >60 >60 mL/min Final   EGFR (Non-African Amer.)  Date Value Ref Range Status  02/12/2014 56 (L)  Final    Comment:    eGFR values <12mL/min/1.73 m2 may be an indication of chronic kidney disease (CKD). Calculated eGFR is useful in patients with stable renal function. The eGFR calculation  will not be  reliable in acutely ill patients when serum creatinine is changing rapidly. It is not useful in  patients on dialysis. The eGFR calculation may not be applicable to patients at the low and high extremes of body sizes, pregnant women, and vegetarians. LABS - This specimen was collected through an   - indwelling catheter or arterial line.  - A minimum of of blood was wasted prior    - to collecting the sample.  Interpret  - results with caution.    GFR calc non Af Amer  Date Value Ref Range Status  08/17/2019 >60 >60 mL/min Final   eGFR  Date Value Ref Range Status  08/27/2023 49 (L) >59 mL/min/1.73 Final         Passed - Patient is not pregnant       OZEMPIC , 2 MG/DOSE, 8 MG/3ML SOPN      Sig: Inject 2 mg into the skin once a week.     Endocrinology:  Diabetes - GLP-1 Receptor Agonists - semaglutide  Failed - 05/11/2024 10:36 AM      Failed - HBA1C in normal range and within 180 days    Hemoglobin A1C  Date Value Ref Range Status  08/03/2023 6.7  Final    Comment:    Dr. Cherilyn         Failed - Cr in normal range and within 360 days    Creatinine  Date Value Ref Range Status  02/12/2014 1.04 0.60 - 1.30 mg/dL Final   Creatinine, Ser  Date Value Ref Range Status  08/27/2023 1.16 (H) 0.57 - 1.00 mg/dL Final   Creatinine, POC  Date Value Ref Range Status  05/15/2019 n/a mg/dL Final         Failed - Valid encounter within last 6 months    Recent Outpatient Visits   None             spironolactone (ALDACTONE) 100 MG tablet 90 tablet 4     Cardiovascular: Diuretics - Aldosterone Antagonist Failed - 05/11/2024 10:36 AM      Failed - Cr in normal range and within 180 days    Creatinine  Date Value Ref Range Status  02/12/2014 1.04 0.60 - 1.30 mg/dL Final   Creatinine, Ser  Date Value Ref Range Status  08/27/2023 1.16 (H) 0.57 - 1.00 mg/dL Final   Creatinine, POC  Date Value Ref Range Status  05/15/2019 n/a mg/dL Final         Failed - K in  normal range and within 180 days    Potassium  Date Value Ref Range Status  08/27/2023 5.0 3.5 - 5.2 mmol/L Final  02/12/2014 4.4 3.5 - 5.1 mmol/L Final         Failed - Na in normal range and within 180 days    Sodium  Date Value Ref Range Status  08/27/2023 141 134 - 144 mmol/L Final  02/12/2014 142 136 - 145 mmol/L Final         Failed - eGFR is 30 or above and within 180 days    EGFR (African American)  Date Value Ref Range Status  02/12/2014 >60  Final   GFR calc Af Amer  Date Value Ref Range Status  08/17/2019 >60 >60 mL/min Final   EGFR (Non-African Amer.)  Date Value Ref Range Status  02/12/2014 56 (L)  Final    Comment:    eGFR values <43mL/min/1.73 m2 may be an indication of chronic kidney disease (CKD). Calculated  eGFR is useful in patients with stable renal function. The eGFR calculation will not be reliable in acutely ill patients when serum creatinine is changing rapidly. It is not useful in  patients on dialysis. The eGFR calculation may not be applicable to patients at the low and high extremes of body sizes, pregnant women, and vegetarians. LABS - This specimen was collected through an   - indwelling catheter or arterial line.  - A minimum of of blood was wasted prior    - to collecting the sample.  Interpret  - results with caution.    GFR calc non Af Amer  Date Value Ref Range Status  08/17/2019 >60 >60 mL/min Final   eGFR  Date Value Ref Range Status  08/27/2023 49 (L) >59 mL/min/1.73 Final         Failed - Valid encounter within last 6 months    Recent Outpatient Visits   None            Passed - Last BP in normal range    BP Readings from Last 1 Encounters:  11/30/23 132/67         Signed Prescriptions Disp Refills   rosuvastatin  (CRESTOR ) 10 MG tablet 90 tablet 0    Sig: Take 1 tablet (10 mg total) by mouth daily.     Cardiovascular:  Antilipid - Statins 2 Failed - 05/11/2024 10:36 AM      Failed - Cr in normal range  and within 360 days    Creatinine  Date Value Ref Range Status  02/12/2014 1.04 0.60 - 1.30 mg/dL Final   Creatinine, Ser  Date Value Ref Range Status  08/27/2023 1.16 (H) 0.57 - 1.00 mg/dL Final   Creatinine, POC  Date Value Ref Range Status  05/15/2019 n/a mg/dL Final         Failed - Valid encounter within last 12 months    Recent Outpatient Visits   None            Failed - Lipid Panel in normal range within the last 12 months    Cholesterol, Total  Date Value Ref Range Status  08/27/2023 231 (H) 100 - 199 mg/dL Final   LDL Chol Calc (NIH)  Date Value Ref Range Status  08/27/2023 151 (H) 0 - 99 mg/dL Final   HDL  Date Value Ref Range Status  08/27/2023 42 >39 mg/dL Final   Triglycerides  Date Value Ref Range Status  08/27/2023 207 (H) 0 - 149 mg/dL Final         Passed - Patient is not pregnant

## 2024-05-11 NOTE — Telephone Encounter (Signed)
 Requested Prescriptions  Pending Prescriptions Disp Refills   gabapentin  (NEURONTIN ) 800 MG tablet 90 tablet 2    Sig: Take 1 tablet (800 mg total) by mouth 3 (three) times daily.     Neurology: Anticonvulsants - gabapentin  Failed - 05/11/2024 10:05 AM      Failed - Cr in normal range and within 360 days    Creatinine  Date Value Ref Range Status  02/12/2014 1.04 0.60 - 1.30 mg/dL Final   Creatinine, Ser  Date Value Ref Range Status  08/27/2023 1.16 (H) 0.57 - 1.00 mg/dL Final   Creatinine, POC  Date Value Ref Range Status  05/15/2019 n/a mg/dL Final         Failed - Valid encounter within last 12 months    Recent Outpatient Visits   None            Passed - Completed PHQ-2 or PHQ-9 in the last 360 days       levocetirizine (XYZAL) 5 MG tablet      Sig: Take 1 tablet (5 mg total) by mouth every evening.     Ear, Nose, and Throat:  Antihistamines - levocetirizine dihydrochloride Failed - 05/11/2024 10:05 AM      Failed - Cr in normal range and within 360 days    Creatinine  Date Value Ref Range Status  02/12/2014 1.04 0.60 - 1.30 mg/dL Final   Creatinine, Ser  Date Value Ref Range Status  08/27/2023 1.16 (H) 0.57 - 1.00 mg/dL Final   Creatinine, POC  Date Value Ref Range Status  05/15/2019 n/a mg/dL Final         Failed - Valid encounter within last 12 months    Recent Outpatient Visits   None            Passed - eGFR is 10 or above and within 360 days    EGFR (African American)  Date Value Ref Range Status  02/12/2014 >60  Final   GFR calc Af Amer  Date Value Ref Range Status  08/17/2019 >60 >60 mL/min Final   EGFR (Non-African Amer.)  Date Value Ref Range Status  02/12/2014 56 (L)  Final    Comment:    eGFR values <94mL/min/1.73 m2 may be an indication of chronic kidney disease (CKD). Calculated eGFR is useful in patients with stable renal function. The eGFR calculation will not be reliable in acutely ill patients when serum creatinine is  changing rapidly. It is not useful in  patients on dialysis. The eGFR calculation may not be applicable to patients at the low and high extremes of body sizes, pregnant women, and vegetarians. LABS - This specimen was collected through an   - indwelling catheter or arterial line.  - A minimum of of blood was wasted prior    - to collecting the sample.  Interpret  - results with caution.    GFR calc non Af Amer  Date Value Ref Range Status  08/17/2019 >60 >60 mL/min Final   eGFR  Date Value Ref Range Status  08/27/2023 49 (L) >59 mL/min/1.73 Final

## 2024-05-15 ENCOUNTER — Ambulatory Visit
Admission: RE | Admit: 2024-05-15 | Discharge: 2024-05-15 | Disposition: A | Discharge status: Home / Self Care | Source: Ambulatory Visit | Attending: Family Medicine | Admitting: Family Medicine

## 2024-05-15 DIAGNOSIS — Z1231 Encounter for screening mammogram for malignant neoplasm of breast: Secondary | ICD-10-CM | POA: Insufficient documentation

## 2024-06-02 DIAGNOSIS — M19072 Primary osteoarthritis, left ankle and foot: Secondary | ICD-10-CM | POA: Diagnosis not present

## 2024-06-02 DIAGNOSIS — M19071 Primary osteoarthritis, right ankle and foot: Secondary | ICD-10-CM | POA: Diagnosis not present

## 2024-06-02 DIAGNOSIS — M1712 Unilateral primary osteoarthritis, left knee: Secondary | ICD-10-CM | POA: Diagnosis not present

## 2024-06-02 DIAGNOSIS — Z96651 Presence of right artificial knee joint: Secondary | ICD-10-CM | POA: Diagnosis not present

## 2024-06-02 DIAGNOSIS — M25461 Effusion, right knee: Secondary | ICD-10-CM | POA: Diagnosis not present

## 2024-06-02 DIAGNOSIS — M2392 Unspecified internal derangement of left knee: Secondary | ICD-10-CM | POA: Diagnosis not present

## 2024-06-02 DIAGNOSIS — Z471 Aftercare following joint replacement surgery: Secondary | ICD-10-CM | POA: Diagnosis not present

## 2024-06-11 DIAGNOSIS — M2392 Unspecified internal derangement of left knee: Secondary | ICD-10-CM | POA: Diagnosis not present

## 2024-06-11 DIAGNOSIS — S8990XA Unspecified injury of unspecified lower leg, initial encounter: Secondary | ICD-10-CM | POA: Diagnosis not present

## 2024-06-11 DIAGNOSIS — M25562 Pain in left knee: Secondary | ICD-10-CM | POA: Diagnosis not present

## 2024-06-12 DIAGNOSIS — Z96651 Presence of right artificial knee joint: Secondary | ICD-10-CM | POA: Diagnosis not present

## 2024-06-14 ENCOUNTER — Ambulatory Visit (INDEPENDENT_AMBULATORY_CARE_PROVIDER_SITE_OTHER)

## 2024-06-14 DIAGNOSIS — Z0000 Encounter for general adult medical examination without abnormal findings: Secondary | ICD-10-CM | POA: Diagnosis not present
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
## 2024-06-14 NOTE — Patient Instructions (Signed)
 Sharon Nelson,  Thank you for taking the time for your Medicare Wellness Visit. I appreciate your continued commitment to your health goals. Please review the care plan we discussed, and feel free to reach out if I can assist you further.  Please note that Annual Wellness Visits do not include a physical exam. Some assessments may be limited, especially if the visit was conducted virtually. If needed, we may recommend an in-person follow-up with your provider.  Ongoing Care Seeing your primary care provider every 3 to 6 months helps us  monitor your health and provide consistent, personalized care.   Referrals If a referral was made during today's visit and you haven't received any updates within two weeks, please contact the referred provider directly to check on the status.  Recommended Screenings:  Health Maintenance  Topic Date Due   Zoster (Shingles) Vaccine (2 of 2) 01/01/2021   Hemoglobin A1C  01/31/2024   Yearly kidney health urinalysis for diabetes  02/23/2024   Flu Shot  02/25/2024   COVID-19 Vaccine (5 - 2025-26 season) 03/27/2024   Eye exam for diabetics  07/22/2024   Yearly kidney function blood test for diabetes  08/26/2024   Breast Cancer Screening  05/15/2025   Medicare Annual Wellness Visit  06/14/2025   Osteoporosis screening with Bone Density Scan  05/16/2026   Colon Cancer Screening  02/05/2027   DTaP/Tdap/Td vaccine (3 - Td or Tdap) 11/07/2030   Pneumococcal Vaccine for age over 16  Completed   Hepatitis C Screening  Completed   Meningitis B Vaccine  Aged Out    Vision: Annual vision screenings are recommended for early detection of glaucoma, cataracts, and diabetic retinopathy. These exams can also reveal signs of chronic conditions such as diabetes and high blood pressure.  Dental: Annual dental screenings help detect early signs of oral cancer, gum disease, and other conditions linked to overall health, including heart disease and diabetes.  Please see the  attached documents for additional preventive care recommendations.   NEXT AWV 06/20/25 @ 3:10 PM IN PERSON

## 2024-07-12 ENCOUNTER — Ambulatory Visit: Admitting: Family Medicine

## 2024-07-12 ENCOUNTER — Encounter: Payer: Self-pay | Admitting: Family Medicine

## 2024-07-12 VITALS — BP 138/80 | HR 65 | Resp 16 | Ht 59.0 in | Wt 147.7 lb

## 2024-07-12 DIAGNOSIS — I10 Essential (primary) hypertension: Secondary | ICD-10-CM | POA: Diagnosis not present

## 2024-07-12 DIAGNOSIS — E1122 Type 2 diabetes mellitus with diabetic chronic kidney disease: Secondary | ICD-10-CM

## 2024-07-12 DIAGNOSIS — J45909 Unspecified asthma, uncomplicated: Secondary | ICD-10-CM | POA: Diagnosis not present

## 2024-07-12 DIAGNOSIS — K760 Fatty (change of) liver, not elsewhere classified: Secondary | ICD-10-CM

## 2024-07-12 DIAGNOSIS — M85859 Other specified disorders of bone density and structure, unspecified thigh: Secondary | ICD-10-CM

## 2024-07-12 DIAGNOSIS — E7800 Pure hypercholesterolemia, unspecified: Secondary | ICD-10-CM

## 2024-07-12 DIAGNOSIS — N1831 Chronic kidney disease, stage 3a: Secondary | ICD-10-CM | POA: Diagnosis not present

## 2024-07-12 DIAGNOSIS — R413 Other amnesia: Secondary | ICD-10-CM

## 2024-07-12 DIAGNOSIS — N1832 Chronic kidney disease, stage 3b: Secondary | ICD-10-CM

## 2024-07-12 DIAGNOSIS — E559 Vitamin D deficiency, unspecified: Secondary | ICD-10-CM | POA: Diagnosis not present

## 2024-07-12 DIAGNOSIS — Z23 Encounter for immunization: Secondary | ICD-10-CM | POA: Diagnosis not present

## 2024-07-12 DIAGNOSIS — Z0000 Encounter for general adult medical examination without abnormal findings: Secondary | ICD-10-CM
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
## 2024-07-12 NOTE — Patient Instructions (Signed)
 Please review the attached list of medications and notify my office if there are any errors.   I strongly recommend two doses of Shingrix  (the shingles vaccine) separated by 2 to 6 months for adults age 75 years and older.

## 2024-07-13 ENCOUNTER — Ambulatory Visit: Payer: Self-pay | Admitting: Family Medicine

## 2024-07-13 DIAGNOSIS — R7982 Elevated C-reactive protein (CRP): Secondary | ICD-10-CM

## 2024-07-13 DIAGNOSIS — I10 Essential (primary) hypertension: Secondary | ICD-10-CM

## 2024-07-13 LAB — LIPID PANEL
Chol/HDL Ratio: 3.1 ratio (ref 0.0–4.4)
Cholesterol, Total: 147 mg/dL (ref 100–199)
HDL: 47 mg/dL (ref >39)
LDL Chol Calc (NIH): 76 mg/dL (ref 0–99)
Triglycerides: 138 mg/dL (ref 0–149)
VLDL Cholesterol Cal: 24 mg/dL (ref 5–40)

## 2024-07-13 LAB — HEMOGLOBIN A1C
Est. average glucose Bld gHb Est-mCnc: 140 mg/dL
Hgb A1c MFr Bld: 6.5 % — ABNORMAL HIGH (ref 4.8–5.6)

## 2024-07-13 LAB — CBC
Hematocrit: 39.5 % (ref 34.0–46.6)
Hemoglobin: 12.6 g/dL (ref 11.1–15.9)
MCH: 27.8 pg (ref 26.6–33.0)
MCHC: 31.9 g/dL (ref 31.5–35.7)
MCV: 87 fL (ref 79–97)
Platelets: 279 x10E3/uL (ref 150–450)
RBC: 4.53 x10E6/uL (ref 3.77–5.28)
RDW: 13.1 % (ref 11.7–15.4)
WBC: 6.3 x10E3/uL (ref 3.4–10.8)

## 2024-07-13 LAB — COMPREHENSIVE METABOLIC PANEL WITH GFR
ALT: 12 IU/L (ref 0–32)
AST: 21 IU/L (ref 0–40)
Albumin: 4.5 g/dL (ref 3.8–4.8)
Alkaline Phosphatase: 88 IU/L (ref 49–135)
BUN/Creatinine Ratio: 12 (ref 12–28)
BUN: 15 mg/dL (ref 8–27)
Bilirubin Total: 0.3 mg/dL (ref 0.0–1.2)
CO2: 22 mmol/L (ref 20–29)
Calcium: 9.9 mg/dL (ref 8.7–10.3)
Chloride: 101 mmol/L (ref 96–106)
Creatinine, Ser: 1.26 mg/dL — ABNORMAL HIGH (ref 0.57–1.00)
Globulin, Total: 3.1 g/dL (ref 1.5–4.5)
Glucose: 66 mg/dL — ABNORMAL LOW (ref 70–99)
Potassium: 4.4 mmol/L (ref 3.5–5.2)
Sodium: 139 mmol/L (ref 134–144)
Total Protein: 7.6 g/dL (ref 6.0–8.5)
eGFR: 45 mL/min/1.73 — ABNORMAL LOW (ref >59)

## 2024-07-13 LAB — MICROALBUMIN / CREATININE URINE RATIO
Creatinine, Urine: 133.7 mg/dL
Microalb/Creat Ratio: <2 mg/g{creat} (ref 0–29)
Microalbumin, Urine: <3 ug/mL

## 2024-07-13 LAB — VITAMIN D 25 HYDROXY (VIT D DEFICIENCY, FRACTURES): Vit D, 25-Hydroxy: 89.2 ng/mL (ref 30.0–100.0)

## 2024-07-18 ENCOUNTER — Other Ambulatory Visit: Payer: Self-pay

## 2024-07-18 DIAGNOSIS — R7982 Elevated C-reactive protein (CRP): Secondary | ICD-10-CM | POA: Insufficient documentation

## 2024-07-18 DIAGNOSIS — I10 Essential (primary) hypertension: Secondary | ICD-10-CM

## 2024-07-18 MED ORDER — AMLODIPINE BESYLATE 2.5 MG PO TABS: 2.5000 mg | ORAL_TABLET | Freq: Every day | ORAL | 1 refills | Status: AC

## 2024-07-18 MED ORDER — AMLODIPINE BESYLATE 2.5 MG PO TABS: 2.5000 mg | ORAL_TABLET | Freq: Every day | ORAL | 1 refills | Status: DC

## 2024-07-18 NOTE — Progress Notes (Signed)
 Have called [facility] and cancel medication. Sent in to [facility] in [place] per pt request.

## 2024-07-26 ENCOUNTER — Telehealth: Payer: Self-pay | Admitting: Family Medicine

## 2024-07-26 LAB — OPHTHALMOLOGY REPORT-SCANNED

## 2024-07-26 NOTE — Telephone Encounter (Unsigned)
 Copied from CRM #[id number]. Topic: Clinical - Medication Question >> Jul 26, 2024 11:59 AM Tiffini S wrote: Reason for CRM: Patient states that [facility] changed the prescription for  estradiol  (ESTRACE ) 0.5 MG tablet- need to discuss the dosage- instructions states to take a whole tablet and not half for 0.5mg    Please call the patient back at [phone] to discuss

## 2024-07-28 ENCOUNTER — Other Ambulatory Visit: Payer: Self-pay

## 2024-08-18 ENCOUNTER — Encounter: Payer: Self-pay | Admitting: Physician Assistant

## 2024-08-18 ENCOUNTER — Ambulatory Visit (INDEPENDENT_AMBULATORY_CARE_PROVIDER_SITE_OTHER): Admitting: Physician Assistant

## 2024-08-18 VITALS — BP 131/75 | Temp 98.6°F | Resp 16 | Ht 59.0 in | Wt 150.2 lb

## 2024-08-18 DIAGNOSIS — J45901 Unspecified asthma with (acute) exacerbation: Secondary | ICD-10-CM | POA: Diagnosis not present

## 2024-08-18 DIAGNOSIS — J069 Acute upper respiratory infection, unspecified: Secondary | ICD-10-CM

## 2024-08-18 DIAGNOSIS — R6889 Other general symptoms and signs: Secondary | ICD-10-CM

## 2024-08-18 DIAGNOSIS — E119 Type 2 diabetes mellitus without complications: Secondary | ICD-10-CM | POA: Diagnosis not present

## 2024-08-18 DIAGNOSIS — Z7985 Long-term (current) use of injectable non-insulin antidiabetic drugs: Secondary | ICD-10-CM

## 2024-08-18 LAB — POCT INFLUENZA A/B
Influenza A, POC: NEGATIVE
Influenza B, POC: NEGATIVE

## 2024-08-18 LAB — POC COVID19 BINAXNOW: SARS Coronavirus 2 Ag: NEGATIVE

## 2024-08-18 MED ORDER — PREDNISONE 20 MG PO TABS: 20.0000 mg | ORAL_TABLET | Freq: Every day | ORAL | 0 refills | Status: AC

## 2024-08-18 MED ORDER — AZITHROMYCIN 250 MG PO TABS: ORAL_TABLET | ORAL | 0 refills | Status: AC

## 2024-08-18 NOTE — Progress Notes (Unsigned)
 " Established patient visit  Patient: Sharon Nelson   DOB: 11-13-1950   74 y.o. Female  MRN: [id number] Visit Date: 08/18/2024  Today's healthcare provider: Jolynn Spencer, PA-C   Chief Complaint  Patient presents with   URI    Symptoms: cough, runny congestion, runny nose, headaches, feeling hot Frequency: patient started on Tuesday with a tickle in throat. OTC meds: cough drops and hot tea. Has been using her inhaler's Hx: of asthma   Subjective     HPI     URI    Additional comments: Symptoms: cough, runny congestion, runny nose, headaches, feeling hot Frequency: patient started on Tuesday with a tickle in throat. OTC meds: cough drops and hot tea. Has been using her inhaler's Hx: of asthma      Last edited by Rosas, Joseline E, CMA on 08/18/2024  1:39 PM.       Discussed the use of AI scribe software for clinical note transcription with the patient, who gave verbal consent to proceed.  History of Present Illness Sharon Nelson is a 74 year old female with asthma who presents with exacerbation of asthma symptoms.  She reports symptoms since Tuesday, including a tick in her throat and difficulty breathing, worse with inhalation and localized to the upper airway. She has been using her albuterol inhaler 2 puffs nightly. She notes wheezing, shortness of breath, and cough. She believes her allergies are triggering this episode. She has not been using a maintenance inhaler and has relied on albuterol alone for prior mild asthma symptoms.       06/14/2024    4:03 PM 08/27/2023    9:08 AM 05/20/2023    1:31 PM  Depression screen PHQ 2/9  Decreased Interest 0 0 0  Down, Depressed, Hopeless 0 0 0  PHQ - 2 Score 0 0 0  Altered sleeping 0  0  Tired, decreased energy 0  0  Change in appetite 0  0  Feeling bad or failure about yourself  0  0  Trouble concentrating 0  0  Moving slowly or fidgety/restless 0  0  Suicidal thoughts 0  0  PHQ-9 Score 0  0   Difficult doing  work/chores Not difficult at all  Not difficult at all     Data saved with a previous flowsheet row definition       No data to display          Medications: Show/hide medication list[1]  Review of Systems All negative Except see HPI   {Insert previous labs (optional):23779} {See past labs  Heme  Chem  Endocrine  Serology  Results Review (optional):1}   Objective    BP 131/75 (BP Location: Left Arm, Patient Position: Sitting, Cuff Size: Normal)   Temp 98.6 F (37 C) (Oral)   Resp 16   Ht 4' 11 (1.499 m)   Wt 150 lb 3.2 oz (68.1 kg)   SpO2 100%   BMI 30.34 kg/m  {Insert last BP/Wt (optional):23777}{See vitals history (optional):1}   Physical Exam Vitals reviewed.  Constitutional:      Appearance: She is normal weight.  HENT:     Head: Normocephalic and atraumatic.     Right Ear: Ear canal and external ear normal.     Left Ear: Ear canal and external ear normal.     Nose: Congestion and rhinorrhea present.     Mouth/Throat:     Pharynx: Posterior oropharyngeal erythema present.     Comments: Postnasal drainage noted Eyes:  General: No scleral icterus.       Right eye: No discharge.        Left eye: No discharge.     Extraocular Movements: Extraocular movements intact.     Pupils: Pupils are equal, round, and reactive to light.  Cardiovascular:     Rate and Rhythm: Normal rate and regular rhythm.  Pulmonary:     Effort: Pulmonary effort is normal.     Breath sounds: Normal breath sounds.  Abdominal:     General: Abdomen is flat. Bowel sounds are normal.     Palpations: Abdomen is soft.  Lymphadenopathy:     Cervical: No cervical adenopathy.  Neurological:     Mental Status: She is alert.      No results found for any visits on 08/18/24.      Assessment and Plan Assessment & Plan Asthma exacerbation Likely triggered by viral infection. Requires increased albuterol use and prednisone . - Continue albuterol inhaler every 4-6 hours as  needed. - Prescribed prednisone  twice daily for 5 days. - Scheduled follow-up in 5-7 days.  Acute upper respiratory infection No COVID-19 or influenza. Symptomatic relief and antibiotics needed. - Prescribed Z-Pak (azithromycin ) to start after prednisone . - Consider Mucinex for cough relief.  In the settings of dmii,   Orders Placed This Encounter  Procedures   POCT Influenza A/B   POC COVID-19 BinaxNow    No follow-ups on file.   The patient was advised to call back or seek an in-person evaluation if the symptoms worsen or if the condition fails to improve as anticipated.  I discussed the assessment and treatment plan with the patient. The patient was provided an opportunity to ask questions and all were answered. The patient agreed with the plan and demonstrated an understanding of the instructions.  I, Bracken Moffa, PA-C have reviewed all documentation for this visit. The documentation on 08/18/2024  for the exam, diagnosis, procedures, and orders are all accurate and complete.  Jolynn Spencer, [facility], MMS [facility] [phone] (phone) [phone] (fax)  [facility]     [1]  Outpatient Medications Prior to Visit  Medication Sig   ADVAIR [facility] 115-21 MCG/ACT inhaler Inhale 2 puffs into the lungs 2 (two) times daily.    albuterol (VENTOLIN HFA) 108 (90 Base) MCG/ACT inhaler Inhale into the lungs every 4 (four) hours as needed.    amLODipine  (NORVASC ) 2.5 MG tablet Take 1 tablet (2.5 mg total) by mouth daily.   aspirin 81 MG tablet Take 81 mg by mouth daily.    Cholecalciferol 2000 UNITS CAPS Take by mouth daily.    Continuous Glucose Sensor (FREESTYLE LIBRE 3 PLUS SENSOR) MISC USE 1 EVERY 14 DAYS   donepezil  (ARICEPT ) 10 MG tablet Take 1 tablet (10 mg total) by mouth at bedtime.   EPINEPHrine  0.3 mg/0.3 mL IJ SOAJ injection    estradiol  (ESTRACE ) 0.5 MG tablet Take 1 tablet (0.5 mg total) by mouth daily.   furosemide  (LASIX ) 20 MG tablet Take 1  tablet (20 mg total) by mouth daily as needed.   gabapentin  (NEURONTIN ) 800 MG tablet Take 1 tablet (800 mg total) by mouth 3 (three) times daily.   glucose blood (ACCU-CHEK AVIVA PLUS) test strip Use as instructed   levocetirizine (XYZAL) 5 MG tablet Take 5 mg by mouth every evening.    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 REMOVE AND DISCARD AFTER 12 HOURS OR AS DIRECTED BY [facility]  OZEMPIC , 2 MG/DOSE, 8 MG/3ML SOPN Inject 2 mg into the skin once a week.   rosuvastatin  (CRESTOR ) 10 MG tablet Take 1 tablet (10 mg total) by mouth daily.   spironolactone  (ALDACTONE ) 100 MG tablet Take 1 tablet (100 mg total) by mouth daily.   cholestyramine  (QUESTRAN ) 4 g packet Take 1 packet (4 g total) by mouth 2 (two) times daily for 14 days.   No facility-administered medications prior to visit.   "

## 2024-08-21 ENCOUNTER — Ambulatory Visit: Admitting: Family Medicine

## 2024-08-30 ENCOUNTER — Ambulatory Visit: Admitting: Family Medicine

## 2024-09-04 ENCOUNTER — Ambulatory Visit: Admitting: Family Medicine

## 2024-09-04 DIAGNOSIS — Z8639 Personal history of other endocrine, nutritional and metabolic disease: Secondary | ICD-10-CM

## 2024-09-04 DIAGNOSIS — M85859 Other specified disorders of bone density and structure, unspecified thigh: Secondary | ICD-10-CM

## 2024-09-04 DIAGNOSIS — G43019 Migraine without aura, intractable, without status migrainosus: Secondary | ICD-10-CM

## 2024-09-04 DIAGNOSIS — K760 Fatty (change of) liver, not elsewhere classified: Secondary | ICD-10-CM

## 2024-09-04 DIAGNOSIS — E559 Vitamin D deficiency, unspecified: Secondary | ICD-10-CM

## 2024-09-04 DIAGNOSIS — N1831 Chronic kidney disease, stage 3a: Secondary | ICD-10-CM

## 2024-09-04 DIAGNOSIS — N1832 Chronic kidney disease, stage 3b: Secondary | ICD-10-CM

## 2024-09-04 DIAGNOSIS — R7982 Elevated C-reactive protein (CRP): Secondary | ICD-10-CM

## 2024-09-04 DIAGNOSIS — E7800 Pure hypercholesterolemia, unspecified: Secondary | ICD-10-CM

## 2024-09-04 DIAGNOSIS — I38 Endocarditis, valve unspecified: Secondary | ICD-10-CM

## 2024-09-04 DIAGNOSIS — I10 Essential (primary) hypertension: Secondary | ICD-10-CM

## 2025-06-20 ENCOUNTER — Ambulatory Visit

## 2025-07-13 ENCOUNTER — Encounter: Admitting: Family Medicine
